# Patient Record
Sex: Female | Born: 1980 | Race: Black or African American | Hispanic: No | Marital: Single | Smoking: Never smoker
Health system: Southern US, Community
[De-identification: ages and names within clinical notes are randomized; demographics above are authoritative.]

## PROBLEM LIST (undated history)

## (undated) DIAGNOSIS — I10 Essential (primary) hypertension: Secondary | ICD-10-CM

## (undated) DIAGNOSIS — E785 Hyperlipidemia, unspecified: Secondary | ICD-10-CM

## (undated) DIAGNOSIS — E119 Type 2 diabetes mellitus without complications: Secondary | ICD-10-CM

## (undated) HISTORY — DX: Hyperlipidemia, unspecified: E78.5

## (undated) HISTORY — DX: Essential (primary) hypertension: I10

---

## 2001-12-25 ENCOUNTER — Emergency Department (HOSPITAL_COMMUNITY): Admission: EM | Admit: 2001-12-25 | Discharge: 2001-12-25 | Payer: Self-pay | Admitting: Emergency Medicine

## 2006-12-04 ENCOUNTER — Emergency Department (HOSPITAL_COMMUNITY): Admission: EM | Admit: 2006-12-04 | Discharge: 2006-12-04 | Payer: Self-pay | Admitting: Emergency Medicine

## 2008-11-09 ENCOUNTER — Emergency Department (HOSPITAL_COMMUNITY): Admission: EM | Admit: 2008-11-09 | Discharge: 2008-11-09 | Payer: Self-pay | Admitting: Emergency Medicine

## 2009-12-20 ENCOUNTER — Inpatient Hospital Stay (HOSPITAL_COMMUNITY): Admission: EM | Admit: 2009-12-20 | Discharge: 2009-12-20 | Payer: Self-pay | Admitting: Emergency Medicine

## 2010-07-14 LAB — GC/CHLAMYDIA PROBE AMP, GENITAL: Chlamydia, DNA Probe: NEGATIVE

## 2010-07-14 LAB — URINE MICROSCOPIC-ADD ON

## 2010-07-14 LAB — COMPREHENSIVE METABOLIC PANEL
CO2: 27 mEq/L (ref 19–32)
Calcium: 9 mg/dL (ref 8.4–10.5)
Glucose, Bld: 108 mg/dL — ABNORMAL HIGH (ref 70–99)
Sodium: 134 mEq/L — ABNORMAL LOW (ref 135–145)
Total Bilirubin: 1.2 mg/dL (ref 0.3–1.2)

## 2010-07-14 LAB — DIFFERENTIAL
Basophils Absolute: 0 10*3/uL (ref 0.0–0.1)
Basophils Absolute: 0 10*3/uL (ref 0.0–0.1)
Basophils Relative: 0 % (ref 0–1)
Eosinophils Absolute: 0.3 10*3/uL (ref 0.0–0.7)
Eosinophils Relative: 1 % (ref 0–5)
Eosinophils Relative: 2 % (ref 0–5)
Monocytes Relative: 5 % (ref 3–12)

## 2010-07-14 LAB — CULTURE, BLOOD (ROUTINE X 2): Culture: NO GROWTH

## 2010-07-14 LAB — GLUCOSE, CAPILLARY

## 2010-07-14 LAB — URINALYSIS, ROUTINE W REFLEX MICROSCOPIC
Glucose, UA: NEGATIVE mg/dL
Ketones, ur: NEGATIVE mg/dL
Protein, ur: NEGATIVE mg/dL
Specific Gravity, Urine: 1.006 (ref 1.005–1.030)
Urobilinogen, UA: 0.2 mg/dL (ref 0.0–1.0)

## 2010-07-14 LAB — POCT I-STAT, CHEM 8
BUN: 6 mg/dL (ref 6–23)
Chloride: 101 mEq/L (ref 96–112)
Creatinine, Ser: 0.9 mg/dL (ref 0.4–1.2)
Glucose, Bld: 126 mg/dL — ABNORMAL HIGH (ref 70–99)
HCT: 40 % (ref 36.0–46.0)
Hemoglobin: 13.6 g/dL (ref 12.0–15.0)
Potassium: 4.1 mEq/L (ref 3.5–5.1)
Sodium: 135 mEq/L (ref 135–145)
TCO2: 26 mmol/L (ref 0–100)

## 2010-07-14 LAB — WET PREP, GENITAL
Clue Cells Wet Prep HPF POC: NONE SEEN
Yeast Wet Prep HPF POC: NONE SEEN

## 2010-07-14 LAB — CBC
Hemoglobin: 12 g/dL (ref 12.0–15.0)
MCH: 29.9 pg (ref 26.0–34.0)
MCHC: 34.1 g/dL (ref 30.0–36.0)
Platelets: 288 10*3/uL (ref 150–400)
Platelets: 294 10*3/uL (ref 150–400)
RBC: 4.11 MIL/uL (ref 3.87–5.11)
WBC: 20.8 10*3/uL — ABNORMAL HIGH (ref 4.0–10.5)
WBC: 22.7 10*3/uL — ABNORMAL HIGH (ref 4.0–10.5)

## 2010-07-14 LAB — URINE CULTURE
Colony Count: NO GROWTH
Culture  Setup Time: 201108232154
Special Requests: NEGATIVE

## 2010-07-14 LAB — D-DIMER, QUANTITATIVE: D-Dimer, Quant: 0.33 ug/mL-FEU (ref 0.00–0.48)

## 2010-07-14 LAB — TSH: TSH: 0.964 u[IU]/mL (ref 0.350–4.500)

## 2010-07-14 LAB — HEMOGLOBIN A1C: Hgb A1c MFr Bld: 8.1 % — ABNORMAL HIGH (ref <5.7)

## 2010-08-06 LAB — POCT I-STAT, CHEM 8
Calcium, Ion: 1.15 mmol/L (ref 1.12–1.32)
Hemoglobin: 14.6 g/dL (ref 12.0–15.0)
Sodium: 138 mEq/L (ref 135–145)
TCO2: 23 mmol/L (ref 0–100)

## 2010-08-06 LAB — DIFFERENTIAL
Eosinophils Absolute: 0.4 10*3/uL (ref 0.0–0.7)
Eosinophils Relative: 2 % (ref 0–5)
Lymphs Abs: 3.4 10*3/uL (ref 0.7–4.0)
Monocytes Relative: 5 % (ref 3–12)

## 2010-08-06 LAB — CBC
Hemoglobin: 13 g/dL (ref 12.0–15.0)
MCHC: 33.1 g/dL (ref 30.0–36.0)
MCV: 89.3 fL (ref 78.0–100.0)
Platelets: 279 10*3/uL (ref 150–400)
RBC: 4.41 MIL/uL (ref 3.87–5.11)
RDW: 14 % (ref 11.5–15.5)

## 2011-05-15 ENCOUNTER — Ambulatory Visit (INDEPENDENT_AMBULATORY_CARE_PROVIDER_SITE_OTHER): Payer: Self-pay

## 2011-05-15 DIAGNOSIS — E109 Type 1 diabetes mellitus without complications: Secondary | ICD-10-CM

## 2011-05-15 DIAGNOSIS — Z111 Encounter for screening for respiratory tuberculosis: Secondary | ICD-10-CM

## 2011-05-15 DIAGNOSIS — Z0000 Encounter for general adult medical examination without abnormal findings: Secondary | ICD-10-CM

## 2011-05-16 ENCOUNTER — Ambulatory Visit (INDEPENDENT_AMBULATORY_CARE_PROVIDER_SITE_OTHER): Payer: Self-pay

## 2011-06-16 ENCOUNTER — Ambulatory Visit: Payer: Self-pay | Admitting: Physician Assistant

## 2011-06-16 ENCOUNTER — Encounter: Payer: Self-pay | Admitting: Physician Assistant

## 2011-06-16 VITALS — BP 114/72 | HR 68 | Temp 98.4°F | Resp 16 | Ht 62.5 in | Wt 217.4 lb

## 2011-06-16 DIAGNOSIS — Z111 Encounter for screening for respiratory tuberculosis: Secondary | ICD-10-CM

## 2011-06-16 NOTE — Progress Notes (Signed)
Patient needs PPD. Here on May 15, 2011 for CPE  With PPD placement. She came in too early for reading. She now comes in for her PPD placement again. She is asymptomatic. See yellow sheet. Ok to place PPD. RTC 48-72 hours for reading.Eula Listen, [name] 06/16/2011 6:42 PM

## 2011-06-19 ENCOUNTER — Encounter (INDEPENDENT_AMBULATORY_CARE_PROVIDER_SITE_OTHER): Payer: Self-pay

## 2011-06-19 DIAGNOSIS — Z111 Encounter for screening for respiratory tuberculosis: Secondary | ICD-10-CM

## 2012-02-25 ENCOUNTER — Encounter (HOSPITAL_COMMUNITY): Payer: Self-pay | Admitting: *Deleted

## 2012-02-25 ENCOUNTER — Emergency Department (HOSPITAL_COMMUNITY)
Admission: EM | Admit: 2012-02-25 | Discharge: 2012-02-25 | Disposition: A | Discharge status: Home / Self Care | Payer: Self-pay | Attending: Emergency Medicine | Admitting: Emergency Medicine

## 2012-02-25 ENCOUNTER — Emergency Department (HOSPITAL_COMMUNITY): Payer: Self-pay

## 2012-02-25 DIAGNOSIS — E860 Dehydration: Secondary | ICD-10-CM

## 2012-02-25 DIAGNOSIS — Z794 Long term (current) use of insulin: Secondary | ICD-10-CM | POA: Insufficient documentation

## 2012-02-25 DIAGNOSIS — E1169 Type 2 diabetes mellitus with other specified complication: Secondary | ICD-10-CM | POA: Insufficient documentation

## 2012-02-25 DIAGNOSIS — D72829 Elevated white blood cell count, unspecified: Secondary | ICD-10-CM

## 2012-02-25 DIAGNOSIS — R739 Hyperglycemia, unspecified: Secondary | ICD-10-CM

## 2012-02-25 HISTORY — DX: Type 2 diabetes mellitus without complications: E11.9

## 2012-02-25 LAB — LACTIC ACID, PLASMA: Lactic Acid, Venous: 1.4 mmol/L (ref 0.5–2.2)

## 2012-02-25 LAB — URINALYSIS, ROUTINE W REFLEX MICROSCOPIC
Glucose, UA: >1000 mg/dL — AB
Ketones, ur: >80 mg/dL — AB
Leukocytes, UA: NEGATIVE
Nitrite: NEGATIVE
Protein, ur: >300 mg/dL — AB
Specific Gravity, Urine: 1.035 — ABNORMAL HIGH (ref 1.005–1.030)
Urobilinogen, UA: 1 mg/dL (ref 0.0–1.0)
pH: 6 (ref 5.0–8.0)

## 2012-02-25 LAB — CBC WITH DIFFERENTIAL/PLATELET
Basophils Absolute: 0.1 10*3/uL (ref 0.0–0.1)
Basophils Relative: 0 % (ref 0–1)
Eosinophils Absolute: 0 10*3/uL (ref 0.0–0.7)
Eosinophils Relative: 0 % (ref 0–5)
HCT: 42.6 % (ref 36.0–46.0)
Hemoglobin: 14.6 g/dL (ref 12.0–15.0)
Lymphocytes Relative: 11 % — ABNORMAL LOW (ref 12–46)
Lymphs Abs: 2.6 10*3/uL (ref 0.7–4.0)
MCH: 29.5 pg (ref 26.0–34.0)
MCHC: 34.3 g/dL (ref 30.0–36.0)
MCV: 86.1 fL (ref 78.0–100.0)
Monocytes Absolute: 1.7 K/uL — ABNORMAL HIGH (ref 0.1–1.0)
Monocytes Relative: 7 % (ref 3–12)
Neutro Abs: 19.9 K/uL — ABNORMAL HIGH (ref 1.7–7.7)
Neutrophils Relative %: 82 % — ABNORMAL HIGH (ref 43–77)
Platelets: 285 10*3/uL (ref 150–400)
RBC: 4.95 MIL/uL (ref 3.87–5.11)
RDW: 13.1 % (ref 11.5–15.5)
WBC: 24.3 10*3/uL — ABNORMAL HIGH (ref 4.0–10.5)

## 2012-02-25 LAB — URINE MICROSCOPIC-ADD ON

## 2012-02-25 LAB — COMPREHENSIVE METABOLIC PANEL WITH GFR
Albumin: 2.9 g/dL — ABNORMAL LOW (ref 3.5–5.2)
BUN: 8 mg/dL (ref 6–23)
Chloride: 95 meq/L — ABNORMAL LOW (ref 96–112)
Creatinine, Ser: 0.9 mg/dL (ref 0.50–1.10)
GFR calc non Af Amer: 84 mL/min — ABNORMAL LOW (ref >90)
Total Bilirubin: 0.4 mg/dL (ref 0.3–1.2)

## 2012-02-25 LAB — COMPREHENSIVE METABOLIC PANEL
ALT: 27 U/L (ref 0–35)
AST: 27 U/L (ref 0–37)
Alkaline Phosphatase: 106 U/L (ref 39–117)
CO2: 21 mEq/L (ref 19–32)
Calcium: 9.2 mg/dL (ref 8.4–10.5)
GFR calc Af Amer: >90 mL/min (ref >90)
Glucose, Bld: 299 mg/dL — ABNORMAL HIGH (ref 70–99)
Potassium: 3.8 mEq/L (ref 3.5–5.1)
Sodium: 129 mEq/L — ABNORMAL LOW (ref 135–145)
Total Protein: 8.2 g/dL (ref 6.0–8.3)

## 2012-02-25 LAB — GLUCOSE, CAPILLARY
Glucose-Capillary: 280 mg/dL — ABNORMAL HIGH (ref 70–99)
Glucose-Capillary: 229 mg/dL — ABNORMAL HIGH (ref 70–99)

## 2012-02-25 MED ORDER — INSULIN REGULAR HUMAN 100 UNIT/ML IJ SOLN: 8.0000 [IU] | Freq: Once | INTRAMUSCULAR | Status: DC

## 2012-02-25 MED ORDER — SODIUM CHLORIDE 0.9 % IV BOLUS (SEPSIS)
1000.0000 mL | Freq: Once | INTRAVENOUS | Status: AC
  Administered 2012-02-25: 1000 mL via INTRAVENOUS

## 2012-02-25 MED ORDER — INSULIN GLARGINE 100 UNIT/ML ~~LOC~~ SOLN
10.0000 [IU] | Freq: Once | SUBCUTANEOUS | Status: AC
  Administered 2012-02-25: 10 [IU] via SUBCUTANEOUS
  Filled 2012-02-25: qty 1

## 2012-02-25 MED ORDER — INSULIN LISPRO PROT & LISPRO (75-25 MIX) 100 UNIT/ML ~~LOC~~ SUSP: 40.0000 [IU] | Freq: Two times a day (BID) | SUBCUTANEOUS | Status: DC

## 2012-02-25 MED ORDER — ACETAMINOPHEN 325 MG PO TABS
ORAL_TABLET | ORAL | Status: AC
  Filled 2012-02-25: qty 1

## 2012-02-25 MED ORDER — ACETAMINOPHEN 325 MG PO TABS
650.0000 mg | ORAL_TABLET | Freq: Once | ORAL | Status: AC
  Administered 2012-02-25: 650 mg via ORAL
  Filled 2012-02-25: qty 1

## 2012-02-25 MED ORDER — INSULIN ASPART 100 UNIT/ML ~~LOC~~ SOLN
8.0000 [IU] | Freq: Once | SUBCUTANEOUS | Status: AC
  Administered 2012-02-25: 8 [IU] via SUBCUTANEOUS
  Filled 2012-02-25: qty 1

## 2012-02-25 NOTE — ED Provider Notes (Addendum)
Recheck pt comfortable. No nv. Tolerating po. Iv fluids complete. Dr Oletta Lamas had indicated d/c pt to home post ivf, that case management coming to assist w getting home meds refilled.   Recheck vitals, afeb, vitals normal. No nv. Blood glucose improved.    Suzi Roots, MD 02/25/12 [phone]   Case manager states they will fill pts insulin for her.  Verified dose w pt, pt states humalog 75/25, 40 units bid.   Suzi Roots, MD 02/25/12 [phone]

## 2012-02-25 NOTE — ED Notes (Signed)
Pt given breakfast tray

## 2012-02-25 NOTE — ED Notes (Signed)
Pt reports elevated blood sugars at home. Also states she has been out of medications for two weeks.

## 2012-02-25 NOTE — Progress Notes (Signed)
[facility] ED CM spoke with ED RN, EDP and meredith in [facility] pharmacy . Pt eligible for chs indigent medication program.  Rx processed and tubed to pharmacy. Pending completion of pharmacy.  Pt with pcp but no coverage as confirmed by pt CM spoke with pt to review list of self pay pcps to further assist her with prescriptions and health care if she is unable to work with her pcp to resolve her bill. Encouraged pt to speak with pcp billing office staff Pt is Dm type 1.   Discussed and provided written information for discounted pharmacies, DSS, health dept, [email or web address], health reform, discount insulin at [employer], chs emergency level of care compared to pcp level of care, insulin pumps, outpatient pharmacies, chs annual indigent medication assistance and financial assistance programs in [employer]  Pt voiced understanding and appreciation of resources and services offered

## 2012-02-25 NOTE — ED Notes (Signed)
Pt reports checked blood sugar early Sunday at it was greater than 500; pt c/o increase urination and feeling weak; denies increase thirst; c/o some slight abdominal cramping; pt has not checked blood sugar since 10/27 early am.

## 2012-02-25 NOTE — ED Notes (Signed)
Patient is resting comfortably. 

## 2012-02-25 NOTE — ED Notes (Signed)
Case management contacted, Selena Batten, message left on voicemail.

## 2012-02-25 NOTE — ED Provider Notes (Signed)
History     CSN: [id number]  Arrival date & time 02/25/12  0306   First MD Initiated Contact with Patient 02/25/12 0350      Chief Complaint  Patient presents with  . Hyperglycemia    (Consider location/radiation/quality/duration/timing/severity/associated sxs/prior treatment) HPI Comments: Pt reports has had DM for about 6 years, told type I DM, has always been on insulin, most recently on 75/25 twice daily.  Has run out about 2 weeks ago and she reprots owe's her PCP office money and so they have not been giving her samples of insulin.  She has had anorexia, so has really been only drinking diet flavored waters.  Some loose stools today times 2, no fevers, chills, sore throat.  Has felt achy.  Has not had flu shot.  No sick contacts.  No N/V.  Due to sugar being over 500 today, came to the ED.  Has otherwise not taken anything for symptoms .   The history is provided by the patient.    Past Medical History  Diagnosis Date  . Diabetes mellitus without complication     History reviewed. No pertinent past surgical history.  No family history on file.  History  Substance Use Topics  . Smoking status: Never Smoker   . Smokeless tobacco: Not on file  . Alcohol Use: No    OB History    Grav Para Term Preterm Abortions TAB SAB Ect Mult Living                  Review of Systems  Constitutional: Positive for appetite change and fatigue.  HENT: Negative for congestion, sore throat, trouble swallowing and sinus pressure.   Respiratory: Negative for cough and shortness of breath.   Cardiovascular: Negative for chest pain.  Gastrointestinal: Negative for nausea, vomiting, abdominal pain and diarrhea.  Genitourinary: Positive for frequency.  Musculoskeletal: Positive for myalgias. Negative for back pain.  Skin: Negative for rash.  Neurological: Positive for weakness. Negative for dizziness, light-headedness, numbness and headaches.  All other systems reviewed and are  negative.    Allergies  Review of patient's allergies indicates no known allergies.  Home Medications   Current Outpatient Rx  Name Route Sig Dispense Refill  . INSULIN LISPRO PROT & LISPRO (75-25) 100 UNIT/ML [place] SUSP Subcutaneous Inject 40 Units into the skin 2 (two) times daily with a meal.      BP 107/83  Pulse 87  Temp 99.3 F (37.4 C) (Oral)  Resp 18  Ht 5\' 2"  (1.575 m)  Wt 187 lb 6.4 oz (85.004 kg)  BMI 34.28 kg/m2  SpO2 97%  LMP 02/17/2012  Physical Exam  Nursing note and vitals reviewed. Constitutional: She is oriented to person, place, and time. She appears well-developed and well-nourished.  Non-toxic appearance. She does not have a sickly appearance. She appears ill. No distress.  HENT:  Head: Normocephalic and atraumatic.  Mouth/Throat: Uvula is midline. Mucous membranes are dry.  Eyes: Pupils are equal, round, and reactive to light. No scleral icterus.  Cardiovascular: Regular rhythm.  Tachycardia present.   Pulmonary/Chest: Effort normal. No respiratory distress. She has no wheezes.  Abdominal: Soft. She exhibits no distension. There is no tenderness. There is no rebound.  Musculoskeletal: Normal range of motion.  Neurological: She is alert and oriented to person, place, and time.  Skin: Skin is warm and dry. No rash noted. She is not diaphoretic.  Psychiatric: She has a normal mood and affect.    ED Course  Procedures (  including critical care time)  Labs Reviewed  CBC WITH DIFFERENTIAL - Abnormal; Notable for the following:    WBC 24.3 (*)     Neutrophils Relative 82 (*)     Neutro Abs 19.9 (*)     Lymphocytes Relative 11 (*)     Monocytes Absolute 1.7 (*)     All other components within normal limits  COMPREHENSIVE METABOLIC PANEL - Abnormal; Notable for the following:    Sodium 129 (*)     Chloride 95 (*)     Glucose, Bld 299 (*)     Albumin 2.9 (*)     GFR calc non Af Amer 84 (*)     All other components within normal limits  URINALYSIS,  ROUTINE W REFLEX MICROSCOPIC - Abnormal; Notable for the following:    APPearance CLOUDY (*)     Specific Gravity, Urine 1.035 (*)     Glucose, UA >1000 (*)     Hgb urine dipstick MODERATE (*)     Bilirubin Urine SMALL (*)     Ketones, ur >80 (*)     Protein, ur >300 (*)     All other components within normal limits  GLUCOSE, CAPILLARY - Abnormal; Notable for the following:    Glucose-Capillary 280 (*)     All other components within normal limits  URINE MICROSCOPIC-ADD ON - Abnormal; Notable for the following:    Squamous Epithelial / LPF MANY (*)     Bacteria, UA FEW (*)     Casts GRANULAR CAST (*)     All other components within normal limits  LACTIC ACID, PLASMA  URINE CULTURE  CULTURE, BLOOD (ROUTINE X 2)  CULTURE, BLOOD (ROUTINE X 2)   Dg Chest Port 1 View  02/25/2012  *RADIOLOGY REPORT*  Clinical Data: Fever.  PORTABLE CHEST - 1 VIEW  Comparison: PA and lateral chest 12/19/2009.  Findings: Lungs are clear.  Heart size is normal.  No pneumothorax or pleural fluid.  IMPRESSION: Negative chest.   Original Report Authenticated By: Bernadene Bell. D'ALESSIO, M.D.      1. Hyperglycemia   2. Leukocytosis   3. Dehydration     ra sat is 100% which is normal by my interpretation  5:54 AM Anion gap is only 13.  Ketones in UA may be simply from not eating much, drinking water only.  Will continue IVF's, offer PO's, give some [place] insulin and discuss with case management to get pt insulin.  Leukocytosis is non specific.  Lactate is normal.     7:23 AM Pt signed out to Dr. Denton Lank to recheck on pt's glucose, case management can be called by RN to address medication needs. recommend follow up with PCP Dr. Talmage Nap.  MDM  Pt reports is type 1 diabetic.  If out of insulin for 2 weeks, I would anticipate glucose would be higher.  Here 280.  Pt with low grade fever, tachycardia, appears dry and dehydrated due to polyuria.  Will give IVF's.  Labs, cultures.  Pt's WBC is very high which prior labs  have shown similar values of low to mid 20's.  Ketones in UA.  No N/V, but could be in early DKA.          Gavin Pound. Oletta Lamas, MD 02/25/12 [phone]

## 2012-02-26 LAB — URINE CULTURE: Colony Count: >=100000

## 2012-03-02 LAB — CULTURE, BLOOD (ROUTINE X 2)
Culture: NO GROWTH
Culture: NO GROWTH

## 2013-09-28 ENCOUNTER — Telehealth: Payer: Self-pay

## 2013-09-28 ENCOUNTER — Ambulatory Visit (INDEPENDENT_AMBULATORY_CARE_PROVIDER_SITE_OTHER): Payer: No Typology Code available for payment source | Admitting: Internal Medicine

## 2013-09-28 VITALS — BP 122/88 | HR 112 | Temp 99.1°F | Resp 18 | Ht 63.0 in | Wt 196.6 lb

## 2013-09-28 DIAGNOSIS — B9689 Other specified bacterial agents as the cause of diseases classified elsewhere: Secondary | ICD-10-CM

## 2013-09-28 DIAGNOSIS — E119 Type 2 diabetes mellitus without complications: Secondary | ICD-10-CM | POA: Insufficient documentation

## 2013-09-28 DIAGNOSIS — J329 Chronic sinusitis, unspecified: Secondary | ICD-10-CM

## 2013-09-28 MED ORDER — AMOXICILLIN 500 MG PO CAPS: 1000.0000 mg | ORAL_CAPSULE | Freq: Two times a day (BID) | ORAL | Status: DC

## 2013-09-28 NOTE — Patient Instructions (Signed)

## 2013-09-28 NOTE — Telephone Encounter (Signed)
Pt notified that it was sent in to [facility] on [address] by [facility].

## 2013-09-28 NOTE — Telephone Encounter (Signed)
Pt seen this morning and wants to know why rx has not been called in yet [facility] [address]

## 2013-09-28 NOTE — Progress Notes (Signed)
   Subjective:    Patient ID: Lisa Werner, female    DOB: 08-Nov-1980, 33 y.o.   MRN: [id number]   HPI 33 year old female complains of sinus pain and headaches. She states her eyes and ears also hurt. Both of her hands are numb and she is having chills. She has been sick for about a week and had gotten worse 3 days ago.She is blowing yellow mucus out of her nose. No cough or fever. She took some allergy OTC 3 days ago but it hasn't helped it got worse. Blood and copious purulent nasal discharge.  Diabetes  Glucose home today 238 Review of Systems     Objective:   Physical Exam  Constitutional: She is oriented to person, place, and time. She appears well-developed and well-nourished. No distress.  HENT:  Head: Normocephalic.  Right Ear: External ear normal.  Left Ear: External ear normal.  Nose: Mucosal edema, rhinorrhea and sinus tenderness present. Epistaxis is observed. Right sinus exhibits maxillary sinus tenderness and frontal sinus tenderness. Left sinus exhibits maxillary sinus tenderness. Left sinus exhibits no frontal sinus tenderness.  Mouth/Throat: Oropharynx is clear and moist.  Eyes: EOM are normal. Pupils are equal, round, and reactive to light.  Neck: Normal range of motion. Neck supple.  Pulmonary/Chest: Effort normal.  Lymphadenopathy:    She has no cervical adenopathy.  Neurological: She is alert and oriented to person, place, and time. She exhibits normal muscle tone. Coordination normal.  Psychiatric: She has a normal mood and affect. Her behavior is normal.          Assessment & Plan:  Sinusitis/IDDM Amoxil/F/up Dr. Talmage Nap

## 2013-11-07 ENCOUNTER — Emergency Department (HOSPITAL_COMMUNITY)
Admission: EM | Admit: 2013-11-07 | Discharge: 2013-11-07 | Disposition: A | Discharge status: Home / Self Care | Payer: No Typology Code available for payment source | Attending: Emergency Medicine | Admitting: Emergency Medicine

## 2013-11-07 ENCOUNTER — Encounter (HOSPITAL_COMMUNITY): Payer: Self-pay | Admitting: Emergency Medicine

## 2013-11-07 DIAGNOSIS — Z79899 Other long term (current) drug therapy: Secondary | ICD-10-CM | POA: Insufficient documentation

## 2013-11-07 DIAGNOSIS — E109 Type 1 diabetes mellitus without complications: Secondary | ICD-10-CM | POA: Insufficient documentation

## 2013-11-07 DIAGNOSIS — R600 Localized edema: Secondary | ICD-10-CM

## 2013-11-07 DIAGNOSIS — R609 Edema, unspecified: Secondary | ICD-10-CM | POA: Insufficient documentation

## 2013-11-07 DIAGNOSIS — Z794 Long term (current) use of insulin: Secondary | ICD-10-CM | POA: Insufficient documentation

## 2013-11-07 MED ORDER — FUROSEMIDE 20 MG PO TABS
20.0000 mg | ORAL_TABLET | Freq: Every day | ORAL | Status: DC
Start: 2013-11-07 — End: 2017-07-17

## 2013-11-07 NOTE — ED Notes (Signed)
AVS explained in detail, especially regarding potassium replacements and proper use of Lasix. Advised to follow up with PCP for better management and potassium re-check. No questions/concerns. Ambulatory with steady gait.

## 2013-11-07 NOTE — ED Notes (Signed)
She c/o painless bilat. Lower leg swelling x ~ 2 weeks.  She states she has had a few episodes of this in the past "but it went away when I put my feet up--this time it's not going away".  She denies pain and is in no distress.

## 2013-11-07 NOTE — ED Provider Notes (Signed)
CSN: [id number]     Arrival date & time 11/07/13  1749 History   None    This chart was scribed for non-physician practitioner, Junius Finner [name], working with Juliet Rude. Rubin Payor, MD by Arlan Organ, ED Scribe. This patient was seen in room WTR5/WTR5 and the patient's care was started at 7:48 PM.   Chief Complaint  Patient presents with  . Leg Swelling   The history is provided by the patient. No language interpreter was used.    HPI Comments: Lisa Werner is a 33 y.o. female with a PMHx of DM Type 1 who presents to the Emergency Department complaining of constant, moderate bilateral lower extremity swelling x 2 weeks that is unchanged. Pt also mentions new mild back pain onset 2-3 days. She denies any recent diet change or increased sodium intake. However, she admits to increased soda consumption. She has tried elevating her legs without any noticeable improvement. States typically when this occasionally occurs, symptoms resolve with elevation. She denies any fever, chills, nausea, vomiting, SOB, or chest pain. She is not currently followed by a PCP. Pt is currently not taking any fluid pills. She has no pertinent past medical history. No other concerns this visit.   Past Medical History  Diagnosis Date  . Diabetes mellitus without complication    No past surgical history on file. No family history on file. History  Substance Use Topics  . Smoking status: Never Smoker   . Smokeless tobacco: Not on file  . Alcohol Use: No   OB History   Grav Para Term Preterm Abortions TAB SAB Ect Mult Living                 Review of Systems  Constitutional: Negative for fever, chills and appetite change.  Respiratory: Negative for chest tightness and shortness of breath.   Cardiovascular: Positive for leg swelling (Bilateral\). Negative for chest pain.  Gastrointestinal: Negative for nausea and vomiting.  Skin: Negative for rash.      Allergies  Review of patient's allergies  indicates no known allergies.  Home Medications   Prior to Admission medications   Medication Sig Start Date End Date Taking? Authorizing Provider  insulin lispro protamine-insulin lispro (HUMALOG 75/25) (75-25) 100 UNIT/ML SUSP Inject 40 Units into the skin 2 (two) times daily with a meal. 02/25/12  Yes Suzi Roots, MD  furosemide (LASIX) 20 MG tablet Take 1 tablet (20 mg total) by mouth daily. 11/07/13   Junius Finner, [name]   Triage Vitals: BP 165/101  Pulse 98  Temp(Src) 98.3 F (36.8 C) (Oral)  Resp 18  SpO2 96%  LMP 10/26/2013   Physical Exam  Nursing note and vitals reviewed. Constitutional: She is oriented to person, place, and time. She appears well-developed and well-nourished.  HENT:  Head: Normocephalic and atraumatic.  Eyes: EOM are normal.  Neck: Normal range of motion.  Cardiovascular: Normal rate, regular rhythm and normal heart sounds.   Pulmonary/Chest: Effort normal and breath sounds normal.  Musculoskeletal: Normal range of motion. She exhibits edema.  1 plus pitting moderate edema to bilateral lower extremities worse in ankles No calf tenderness FROM of ankles and all toes  Neurological: She is alert and oriented to person, place, and time.  Skin: Skin is warm and dry.  Skin intact No erythema or warmth  Psychiatric: She has a normal mood and affect. Her behavior is normal.    ED Course  Procedures (including critical care time)  DIAGNOSTIC STUDIES: Oxygen Saturation is  96% on RA, Adequate by my interpretation.    COORDINATION OF CARE: 7:49 PM- Will prescribe Lasix at discharge. Advised pt of possible increased urine output secondary to medication. Discussed treatment plan with pt at bedside and pt agreed to plan.     Labs Review Labs Reviewed - No data to display  Imaging Review No results found.   EKG Interpretation None      MDM   Final diagnoses:  Bilateral leg edema    pt is a 33yo female with hx of IDDM presenting to ED c/o  bilateral lower leg swelling x2 weeks. Reports swelling typically decreases with elevation but this time it is not going away. Pt denies injury. Denies fever, n/v/d. Denies change in skin color. Denies pain or numbness in legs or feet.  Lungs: CTAB. No respiratory distress. No evidence of underlying infection. Doubt DVT.  Will place pt on low dose of Lasix and have pt f/u with PCP for further evaluation and continued management of lower leg edema. Return precautions provided. Pt verbalized understanding and agreement with tx plan.   I personally performed the services described in this documentation, which was scribed in my presence. The recorded information has been reviewed and is accurate.    Junius Finnerrin O'Malley, [name] 11/08/13 1010

## 2013-11-07 NOTE — Discharge Instructions (Signed)
Please call to schedule a follow up appointment with your primary care provider for recheck of swelling in your legs.  It is important to have close follow up for continuous evaluation and treatment of leg swelling, especially with diabetes.  If swelling does not improve with the help of lasix and elevation, you may need compression stockings and you may also need additional testing/imaging.    The lasix may cause you to pee more, this is a common side effect of the medication.  Return to the ER if you develop chest pain or shortness of breathing.    Peripheral Edema You have swelling in your legs (peripheral edema). This swelling is due to excess accumulation of salt and water in your body. Edema may be a sign of heart, kidney or liver disease, or a side effect of a medication. It may also be due to problems in the leg veins. Elevating your legs and using special support stockings may be very helpful, if the cause of the swelling is due to poor venous circulation. Avoid long periods of standing, whatever the cause. Treatment of edema depends on identifying the cause. Chips, pretzels, pickles and other salty foods should be avoided. Restricting salt in your diet is almost always needed. Water pills (diuretics) are often used to remove the excess salt and water from your body via urine. These medicines prevent the kidney from reabsorbing sodium. This increases urine flow. Diuretic treatment may also result in lowering of potassium levels in your body. Potassium supplements may be needed if you have to use diuretics daily. Daily weights can help you keep track of your progress in clearing your edema. You should call your caregiver for follow up care as recommended. SEEK IMMEDIATE MEDICAL CARE IF:   You have increased swelling, pain, redness, or heat in your legs.  You develop shortness of breath, especially when lying down.  You develop chest or abdominal pain, weakness, or fainting.  You have a  fever. Document Released: 05/24/2004 Document Revised: 07/09/2011 Document Reviewed: 05/04/2009 [facility]Exit[facility] Patient Information 2015 [place], [place]. This information is not intended to replace advice given to you by your health care provider. Make sure you discuss any questions you have with your health care provider.

## 2013-11-09 NOTE — ED Provider Notes (Signed)
Medical screening examination/treatment/procedure(s) were performed by non-physician practitioner and as supervising physician I was immediately available for consultation/collaboration.   EKG Interpretation None       Juliet RudeNathan R. Rubin PayorPickering, MD 11/09/13 0020

## 2014-04-01 ENCOUNTER — Encounter (HOSPITAL_COMMUNITY): Payer: Self-pay | Admitting: Emergency Medicine

## 2014-04-01 ENCOUNTER — Emergency Department (HOSPITAL_COMMUNITY)
Admission: EM | Admit: 2014-04-01 | Discharge: 2014-04-01 | Disposition: A | Discharge status: Home / Self Care | Payer: No Typology Code available for payment source | Attending: Emergency Medicine | Admitting: Emergency Medicine

## 2014-04-01 DIAGNOSIS — Z7951 Long term (current) use of inhaled steroids: Secondary | ICD-10-CM | POA: Insufficient documentation

## 2014-04-01 DIAGNOSIS — R739 Hyperglycemia, unspecified: Secondary | ICD-10-CM

## 2014-04-01 DIAGNOSIS — Z794 Long term (current) use of insulin: Secondary | ICD-10-CM | POA: Insufficient documentation

## 2014-04-01 DIAGNOSIS — E1165 Type 2 diabetes mellitus with hyperglycemia: Secondary | ICD-10-CM | POA: Insufficient documentation

## 2014-04-01 DIAGNOSIS — Z3202 Encounter for pregnancy test, result negative: Secondary | ICD-10-CM | POA: Insufficient documentation

## 2014-04-01 LAB — CBC WITH DIFFERENTIAL/PLATELET
BASOS PCT: 0 % (ref 0–1)
Basophils Absolute: 0.1 10*3/uL (ref 0.0–0.1)
Eosinophils Absolute: 0.3 10*3/uL (ref 0.0–0.7)
Eosinophils Relative: 2 % (ref 0–5)
HEMATOCRIT: 41 % (ref 36.0–46.0)
Hemoglobin: 13.5 g/dL (ref 12.0–15.0)
Lymphocytes Relative: 17 % (ref 12–46)
Lymphs Abs: 2.1 10*3/uL (ref 0.7–4.0)
MCH: 28.9 pg (ref 26.0–34.0)
MCHC: 32.9 g/dL (ref 30.0–36.0)
MCV: 87.8 fL (ref 78.0–100.0)
MONO ABS: 0.7 10*3/uL (ref 0.1–1.0)
Monocytes Relative: 5 % (ref 3–12)
NEUTROS ABS: 9.3 10*3/uL — AB (ref 1.7–7.7)
Neutrophils Relative %: 76 % (ref 43–77)
PLATELETS: 314 10*3/uL (ref 150–400)
RBC: 4.67 MIL/uL (ref 3.87–5.11)
RDW: 12.9 % (ref 11.5–15.5)
WBC: 12.4 10*3/uL — ABNORMAL HIGH (ref 4.0–10.5)

## 2014-04-01 LAB — URINALYSIS, ROUTINE W REFLEX MICROSCOPIC
BILIRUBIN URINE: NEGATIVE
Glucose, UA: >1000 mg/dL — AB
Ketones, ur: 15 mg/dL — AB
Leukocytes, UA: NEGATIVE
Nitrite: NEGATIVE
Protein, ur: 100 mg/dL — AB
Specific Gravity, Urine: 1.037 — ABNORMAL HIGH (ref 1.005–1.030)
Urobilinogen, UA: 0.2 mg/dL (ref 0.0–1.0)
pH: 5.5 (ref 5.0–8.0)

## 2014-04-01 LAB — BLOOD GAS, VENOUS
ACID-BASE DEFICIT: 0.5 mmol/L (ref 0.0–2.0)
BICARBONATE: 25.8 meq/L — AB (ref 20.0–24.0)
Drawn by: 294591
FIO2: 0.21 %
O2 Saturation: 21.2 %
PATIENT TEMPERATURE: 97.9
TCO2: 23.5 mmol/L (ref 0–100)
pCO2, Ven: 50.1 mmHg — ABNORMAL HIGH (ref 45.0–50.0)
pH, Ven: 7.33 — ABNORMAL HIGH (ref 7.250–7.300)

## 2014-04-01 LAB — BASIC METABOLIC PANEL
ANION GAP: 16 — AB (ref 5–15)
BUN: 7 mg/dL (ref 6–23)
CALCIUM: 9.6 mg/dL (ref 8.4–10.5)
CO2: 23 mEq/L (ref 19–32)
CREATININE: 0.71 mg/dL (ref 0.50–1.10)
Chloride: 94 mEq/L — ABNORMAL LOW (ref 96–112)
GFR calc Af Amer: >90 mL/min (ref >90)
Glucose, Bld: 353 mg/dL — ABNORMAL HIGH (ref 70–99)
Potassium: 4.3 mEq/L (ref 3.7–5.3)
Sodium: 133 mEq/L — ABNORMAL LOW (ref 137–147)

## 2014-04-01 LAB — POC URINE PREG, ED: Preg Test, Ur: NEGATIVE

## 2014-04-01 LAB — CBG MONITORING, ED
Glucose-Capillary: 346 mg/dL — ABNORMAL HIGH (ref 70–99)
Glucose-Capillary: 273 mg/dL — ABNORMAL HIGH (ref 70–99)

## 2014-04-01 LAB — URINE MICROSCOPIC-ADD ON

## 2014-04-01 MED ORDER — FLUCONAZOLE 150 MG PO TABS
150.0000 mg | ORAL_TABLET | Freq: Once | ORAL | Status: AC
  Administered 2014-04-01: 150 mg via ORAL
  Filled 2014-04-01: qty 1

## 2014-04-01 MED ORDER — SODIUM CHLORIDE 0.9 % IV BOLUS (SEPSIS)
2000.0000 mL | Freq: Once | INTRAVENOUS | Status: AC
  Administered 2014-04-01: 2000 mL via INTRAVENOUS

## 2014-04-01 MED ORDER — INSULIN ASPART 100 UNIT/ML ~~LOC~~ SOLN
6.0000 [IU] | Freq: Once | SUBCUTANEOUS | Status: AC
  Administered 2014-04-01: 6 [IU] via SUBCUTANEOUS
  Filled 2014-04-01: qty 1

## 2014-04-01 MED ORDER — INSULIN ASPART PROT & ASPART (70-30 MIX) 100 UNIT/ML ~~LOC~~ SUSP
40.0000 [IU] | Freq: Two times a day (BID) | SUBCUTANEOUS | Status: DC
  Administered 2014-04-01: 40 [IU] via SUBCUTANEOUS
  Filled 2014-04-01: qty 10

## 2014-04-01 NOTE — Discharge Instructions (Signed)
Hyperglycemia Use your insulin as prescribed. Follow-up with Dr. Yetta BarreJones today to get supplies such as glucose testing strips. Hyperglycemia occurs when the glucose (sugar) in your blood is too high. Hyperglycemia can happen for many reasons, but it most often happens to people who do not know they have diabetes or are not managing their diabetes properly.  CAUSES  Whether you have diabetes or not, there are other causes of hyperglycemia. Hyperglycemia can occur when you have diabetes, but it can also occur in other situations that you might not be as aware of, such as: Diabetes  If you have diabetes and are having problems controlling your blood glucose, hyperglycemia could occur because of some of the following reasons:  Not following your meal plan.  Not taking your diabetes medications or not taking it properly.  Exercising less or doing less activity than you normally do.  Being sick. Pre-diabetes  This cannot be ignored. Before people develop Type 2 diabetes, they almost always have "pre-diabetes." This is when your blood glucose levels are higher than normal, but not yet high enough to be diagnosed as diabetes. Research has shown that some long-term damage to the body, especially the heart and circulatory system, may already be occurring during pre-diabetes. If you take action to manage your blood glucose when you have pre-diabetes, you may delay or prevent Type 2 diabetes from developing. Stress  If you have diabetes, you may be "diet" controlled or on oral medications or insulin to control your diabetes. However, you may find that your blood glucose is higher than usual in the hospital whether you have diabetes or not. This is often referred to as "stress hyperglycemia." Stress can elevate your blood glucose. This happens because of hormones put out by the body during times of stress. If stress has been the cause of your high blood glucose, it can be followed regularly by your caregiver.  That way he/she can make sure your hyperglycemia does not continue to get worse or progress to diabetes. Steroids  Steroids are medications that act on the infection fighting system (immune system) to block inflammation or infection. One side effect can be a rise in blood glucose. Most people can produce enough extra insulin to allow for this rise, but for those who cannot, steroids make blood glucose levels go even higher. It is not unusual for steroid treatments to "uncover" diabetes that is developing. It is not always possible to determine if the hyperglycemia will go away after the steroids are stopped. A special blood test called an A1c is sometimes done to determine if your blood glucose was elevated before the steroids were started. SYMPTOMS  Thirsty.  Frequent urination.  Dry mouth.  Blurred vision.  Tired or fatigue.  Weakness.  Sleepy.  Tingling in feet or leg. DIAGNOSIS  Diagnosis is made by monitoring blood glucose in one or all of the following ways:  A1c test. This is a chemical found in your blood.  Fingerstick blood glucose monitoring.  Laboratory results. TREATMENT  First, knowing the cause of the hyperglycemia is important before the hyperglycemia can be treated. Treatment may include, but is not be limited to:  Education.  Change or adjustment in medications.  Change or adjustment in meal plan.  Treatment for an illness, infection, etc.  More frequent blood glucose monitoring.  Change in exercise plan.  Decreasing or stopping steroids.  Lifestyle changes. HOME CARE INSTRUCTIONS   Test your blood glucose as directed.  Exercise regularly. Your caregiver will give you  instructions about exercise. Pre-diabetes or diabetes which comes on with stress is helped by exercising.  Eat wholesome, balanced meals. Eat often and at regular, fixed times. Your caregiver or nutritionist will give you a meal plan to guide your sugar intake.  Being at an ideal  weight is important. If needed, losing as little as 10 to 15 pounds may help improve blood glucose levels. SEEK MEDICAL CARE IF:   You have questions about medicine, activity, or diet.  You continue to have symptoms (problems such as increased thirst, urination, or weight gain). SEEK IMMEDIATE MEDICAL CARE IF:   You are vomiting or have diarrhea.  Your breath smells fruity.  You are breathing faster or slower.  You are very sleepy or incoherent.  You have numbness, tingling, or pain in your feet or hands.  You have chest pain.  Your symptoms get worse even though you have been following your caregiver's orders.  If you have any other questions or concerns. Document Released: 10/10/2000 Document Revised: 07/09/2011 Document Reviewed: 08/13/2011 [facility]Exit[facility] Patient Information 2015 [place], [place]. This information is not intended to replace advice given to you by your health care provider. Make sure you discuss any questions you have with your health care provider.

## 2014-04-01 NOTE — ED Notes (Signed)
Pt presents with c/o waking up at 0350 "not feeling right" chills, vision "not right" difficulty swallowing. Denies pain, denies fever, denies n/v/d.

## 2014-04-01 NOTE — ED Provider Notes (Signed)
CSN: [id number]     Arrival date & time 04/01/14  0402 History   First MD Initiated Contact with Patient 04/01/14 0458     Chief Complaint  Patient presents with  . Doesnt feel right      (Consider location/radiation/quality/duration/timing/severity/associated sxs/prior Treatment) HPI Complains of "not feeling right" onset upon awakening 3 AM today reports difficulty swallowing, blurred vision, throat feels dry. Patient feels thirsty also admits to polyuria. She denies pain anywhere. She admits to noncompliance with her insulin for the past several days as she has run out. No other associated symptoms. She feels somewhat improved since she's been here. No treatment prior to coming here. Vision is now normal. Past Medical History  Diagnosis Date  . Diabetes mellitus without complication    History reviewed. No pertinent past surgical history. No family history on file. History  Substance Use Topics  . Smoking status: Never Smoker   . Smokeless tobacco: Not on file  . Alcohol Use: No   OB History    No data available     Review of Systems  Constitutional: Negative.   HENT: Negative.   Eyes: Positive for visual disturbance.  Respiratory: Negative.   Cardiovascular: Negative.   Gastrointestinal: Negative.   Endocrine: Positive for cold intolerance, polydipsia and polyuria.       Chills  Musculoskeletal: Negative.   Skin: Negative.   Neurological: Negative.   Psychiatric/Behavioral: Negative.   All other systems reviewed and are negative.     Allergies  Review of patient's allergies indicates no known allergies.  Home Medications   Prior to Admission medications   Medication Sig Start Date End Date Taking? Authorizing Provider  fluticasone (FLONASE) 50 MCG/ACT nasal spray Place 2 sprays into both nostrils daily.   Yes Historical Provider, MD  insulin lispro protamine-insulin lispro (HUMALOG 75/25) (75-25) 100 UNIT/ML SUSP Inject 40 Units into the skin 2 (two) times  daily with a meal. 02/25/12  Yes Suzi RootsKevin E Steinl, MD  furosemide (LASIX) 20 MG tablet Take 1 tablet (20 mg total) by mouth daily. Patient not taking: Reported on 04/01/2014 11/07/13   Junius FinnerErin O'Malley, [name]   BP 143/101 mmHg  Pulse 97  Temp(Src) 97.6 F (36.4 C) (Oral)  Resp 14  Ht 5\' 2"  (1.575 m)  Wt 197 lb (89.359 kg)  BMI 36.02 kg/m2  SpO2 98%  LMP 02/28/2014 Physical Exam  Constitutional: She appears well-developed and well-nourished. No distress.  HENT:  Head: Normocephalic and atraumatic.  Mucous membranes dry  Eyes: Conjunctivae are normal. Pupils are equal, round, and reactive to light.  Neck: Neck supple. No tracheal deviation present. No thyromegaly present.  Cardiovascular: Normal rate and regular rhythm.   No murmur heard. Pulmonary/Chest: Effort normal and breath sounds normal.  Abdominal: Soft. Bowel sounds are normal. She exhibits no distension. There is no tenderness.  Musculoskeletal: Normal range of motion. She exhibits no edema or tenderness.  Neurological: She is alert. Coordination normal.  Skin: Skin is warm and dry. No rash noted.  Psychiatric: She has a normal mood and affect.  Nursing note and vitals reviewed.  Visual acuity 20/30 left eye, 20/30 right eye ED Course  Procedures (including critical care time) Labs Review Labs Reviewed  CBG MONITORING, ED - Abnormal; Notable for the following:    Glucose-Capillary 346 (*)    All other components within normal limits    Imaging Review No results found.   EKG Interpretation None     8:25 AM patient alert asymptomatic after treatment with  intravenous fluids and subcutaneous insulin Results for orders placed or performed during the hospital encounter of 04/01/14  Basic metabolic panel  Result Value Ref Range   Sodium 133 (L) 137 - 147 mEq/L   Potassium 4.3 3.7 - 5.3 mEq/L   Chloride 94 (L) 96 - 112 mEq/L   CO2 23 19 - 32 mEq/L   Glucose, Bld 353 (H) 70 - 99 mg/dL   BUN 7 6 - 23 mg/dL   Creatinine,  Ser 1.61 0.50 - 1.10 mg/dL   Calcium 9.6 8.4 - 09.6 mg/dL   GFR calc non Af Amer >90 >90 mL/min   GFR calc Af Amer >90 >90 mL/min   Anion gap 16 (H) 5 - 15  CBC with Differential  Result Value Ref Range   WBC 12.4 (H) 4.0 - 10.5 K/uL   RBC 4.67 3.87 - 5.11 MIL/uL   Hemoglobin 13.5 12.0 - 15.0 g/dL   HCT 04.5 40.9 - 81.1 %   MCV 87.8 78.0 - 100.0 fL   MCH 28.9 26.0 - 34.0 pg   MCHC 32.9 30.0 - 36.0 g/dL   RDW 91.4 78.2 - 95.6 %   Platelets 314 150 - 400 K/uL   Neutrophils Relative % 76 43 - 77 %   Neutro Abs 9.3 (H) 1.7 - 7.7 K/uL   Lymphocytes Relative 17 12 - 46 %   Lymphs Abs 2.1 0.7 - 4.0 K/uL   Monocytes Relative 5 3 - 12 %   Monocytes Absolute 0.7 0.1 - 1.0 K/uL   Eosinophils Relative 2 0 - 5 %   Eosinophils Absolute 0.3 0.0 - 0.7 K/uL   Basophils Relative 0 0 - 1 %   Basophils Absolute 0.1 0.0 - 0.1 K/uL  Urinalysis, Routine w reflex microscopic  Result Value Ref Range   Color, Urine YELLOW YELLOW   APPearance CLEAR CLEAR   Specific Gravity, Urine 1.037 (H) 1.005 - 1.030   pH 5.5 5.0 - 8.0   Glucose, UA >1000 (A) NEGATIVE mg/dL   Hgb urine dipstick TRACE (A) NEGATIVE   Bilirubin Urine NEGATIVE NEGATIVE   Ketones, ur 15 (A) NEGATIVE mg/dL   Protein, ur 213 (A) NEGATIVE mg/dL   Urobilinogen, UA 0.2 0.0 - 1.0 mg/dL   Nitrite NEGATIVE NEGATIVE   Leukocytes, UA NEGATIVE NEGATIVE  Blood gas, venous  Result Value Ref Range   FIO2 0.21 %   pH, Ven 7.330 (H) 7.250 - 7.300   pCO2, Ven 50.1 (H) 45.0 - 50.0 mmHg   pO2, Ven BELOW REPORTABLE RANGE.  30.0 - 45.0 mmHg   Bicarbonate 25.8 (H) 20.0 - 24.0 mEq/L   TCO2 23.5 0 - 100 mmol/L   Acid-base deficit 0.5 0.0 - 2.0 mmol/L   O2 Saturation 21.2 %   Patient temperature 97.9    Collection site VEIN    Drawn by [phone]    Sample type VEIN   Urine microscopic-add on  Result Value Ref Range   Squamous Epithelial / LPF RARE RARE   WBC, UA 3-6 <3 WBC/hpf   Urine-Other RARE YEAST   POC CBG, ED  Result Value Ref Range    Glucose-Capillary 346 (H) 70 - 99 mg/dL  POC urine preg, ED (not at [facility])  Result Value Ref Range   Preg Test, Ur NEGATIVE NEGATIVE  CBG monitoring, ED  Result Value Ref Range   Glucose-Capillary 273 (H) 70 - 99 mg/dL   No results found.  MDM  Plan she'll be given a bottle of insulin NovoLog  75/25 to go to use 40 units twice daily subcutaneously. Diflucan 150 mg po prior to d/c, f/u PMD Final diagnoses:  None   diagnosis #1 hyperglycemia #2 uti #813medication non compliance     Doug SouSam Skyah Hannon, MD 04/01/14 [id number]

## 2014-08-26 ENCOUNTER — Encounter (HOSPITAL_COMMUNITY): Payer: Self-pay | Admitting: Emergency Medicine

## 2014-08-26 ENCOUNTER — Emergency Department (HOSPITAL_COMMUNITY)
Admission: EM | Admit: 2014-08-26 | Discharge: 2014-08-26 | Disposition: A | Discharge status: Home / Self Care | Payer: No Typology Code available for payment source | Attending: Emergency Medicine | Admitting: Emergency Medicine

## 2014-08-26 DIAGNOSIS — Z794 Long term (current) use of insulin: Secondary | ICD-10-CM | POA: Insufficient documentation

## 2014-08-26 DIAGNOSIS — K047 Periapical abscess without sinus: Secondary | ICD-10-CM | POA: Insufficient documentation

## 2014-08-26 DIAGNOSIS — K088 Other specified disorders of teeth and supporting structures: Secondary | ICD-10-CM | POA: Diagnosis present

## 2014-08-26 DIAGNOSIS — Z79899 Other long term (current) drug therapy: Secondary | ICD-10-CM | POA: Diagnosis not present

## 2014-08-26 DIAGNOSIS — E119 Type 2 diabetes mellitus without complications: Secondary | ICD-10-CM | POA: Diagnosis not present

## 2014-08-26 DIAGNOSIS — Z7951 Long term (current) use of inhaled steroids: Secondary | ICD-10-CM | POA: Diagnosis not present

## 2014-08-26 MED ORDER — PENICILLIN V POTASSIUM 500 MG PO TABS: 500.0000 mg | ORAL_TABLET | Freq: Three times a day (TID) | ORAL | Status: DC

## 2014-08-26 MED ORDER — PENICILLIN V POTASSIUM 500 MG PO TABS
500.0000 mg | ORAL_TABLET | Freq: Once | ORAL | Status: AC
  Administered 2014-08-26: 500 mg via ORAL
  Filled 2014-08-26: qty 1

## 2014-08-26 MED ORDER — OXYCODONE-ACETAMINOPHEN 5-325 MG PO TABS: 1.0000 | ORAL_TABLET | ORAL | Status: DC | PRN

## 2014-08-26 MED ORDER — IBUPROFEN 800 MG PO TABS
800.0000 mg | ORAL_TABLET | Freq: Once | ORAL | Status: AC
  Administered 2014-08-26: 800 mg via ORAL
  Filled 2014-08-26: qty 1

## 2014-08-26 NOTE — ED Provider Notes (Signed)
CSN: [id number]     Arrival date & time 08/26/14  0132 History   First MD Initiated Contact with Patient 08/26/14 0214     Chief Complaint  Patient presents with  . Dental Pain     (Consider location/radiation/quality/duration/timing/severity/associated sxs/prior Treatment) Patient is a 34 y.o. female presenting with tooth pain. The history is provided by the patient. No language interpreter was used.  Dental Pain Location:  Lower Lower teeth location:  19/LL 1st molar and 18/LL 2nd molar Severity:  Moderate Onset quality:  Gradual Associated symptoms: facial swelling   Associated symptoms: no fever and no neck pain   Associated symptoms comment:  Dental pain on left side associated with facial swelling. No fever. No difficulty swallowing.    Past Medical History  Diagnosis Date  . Diabetes mellitus without complication    History reviewed. No pertinent past surgical history. History reviewed. No pertinent family history. History  Substance Use Topics  . Smoking status: Never Smoker   . Smokeless tobacco: Not on file  . Alcohol Use: No   OB History    No data available     Review of Systems  Constitutional: Negative for fever.  HENT: Positive for dental problem and facial swelling. Negative for trouble swallowing.   Gastrointestinal: Negative for nausea.  Musculoskeletal: Negative for neck pain.      Allergies  Review of patient's allergies indicates no known allergies.  Home Medications   Prior to Admission medications   Medication Sig Start Date End Date Taking? Authorizing Provider  fluticasone (FLONASE) 50 MCG/ACT nasal spray Place 2 sprays into both nostrils daily.    Historical Provider, MD  furosemide (LASIX) 20 MG tablet Take 1 tablet (20 mg total) by mouth daily. Patient not taking: Reported on 04/01/2014 11/07/13   Junius FinnerErin O'Malley, [name]  insulin lispro protamine-insulin lispro (HUMALOG 75/25) (75-25) 100 UNIT/ML SUSP Inject 40 Units into the skin 2 (two)  times daily with a meal. 02/25/12   Cathren LaineKevin Steinl, MD   BP 150/97 mmHg  Pulse 110  Temp(Src) 98.2 F (36.8 C) (Oral)  Resp 20  SpO2 96%  LMP 08/06/2014 (Exact Date) Physical Exam  Constitutional: She is oriented to person, place, and time. She appears well-developed and well-nourished.  HENT:  Large abscess at #20. Mild facial swelling on left. There is no neck fullness or tenderness. She exhibits trismus.  Neck: Normal range of motion.  Pulmonary/Chest: Effort normal.  Neurological: She is alert and oriented to person, place, and time.  Skin: Skin is warm and dry.    ED Course  Procedures (including critical care time) Labs Review Labs Reviewed - No data to display  Imaging Review No results found.   EKG Interpretation None      MDM   Final diagnoses:  None    1. Dental abscess  She is well appearing, non-toxic. Appears uncomfortable which is likely the reason for tachycardia. Started on penicillin in the emergency department. She is provided Rx's for antibiotic and pain and a list of dental resources. Return precautions discussed.     Elpidio AnisShari Amoy Steeves, [name] 08/26/14 [id number]  Loren Raceravid Yelverton, MD 08/26/14 [phone]

## 2014-08-26 NOTE — Discharge Instructions (Signed)
Dental Abscess °A dental abscess is a collection of infected fluid (pus) from a bacterial infection in the inner part of the tooth (pulp). It usually occurs at the end of the tooth's root.  °CAUSES  °· Severe tooth decay. °· Trauma to the tooth that allows bacteria to enter into the pulp, such as a broken or chipped tooth. °SYMPTOMS  °· Severe pain in and around the infected tooth. °· Swelling and redness around the abscessed tooth or in the mouth or face. °· Tenderness. °· Pus drainage. °· Bad breath. °· Bitter taste in the mouth. °· Difficulty swallowing. °· Difficulty opening the mouth. °· Nausea. °· Vomiting. °· Chills. °· Swollen neck glands. °DIAGNOSIS  °· A medical and dental history will be taken. °· An examination will be performed by tapping on the abscessed tooth. °· X-rays may be taken of the tooth to identify the abscess. °TREATMENT °The goal of treatment is to eliminate the infection. You may be prescribed antibiotic medicine to stop the infection from spreading. A root canal may be performed to save the tooth. If the tooth cannot be saved, it may be pulled (extracted) and the abscess may be drained.  °HOME CARE INSTRUCTIONS °· Only take over-the-counter or prescription medicines for pain, fever, or discomfort as directed by your caregiver. °· Rinse your mouth (gargle) often with salt water (¼ tsp salt in 8 oz [250 ml] of warm water) to relieve pain or swelling. °· Do not drive after taking pain medicine (narcotics). °· Do not apply heat to the outside of your face. °· Return to your dentist for further treatment as directed. °SEEK MEDICAL CARE IF: °· Your pain is not helped by medicine. °· Your pain is getting worse instead of better. °SEEK IMMEDIATE MEDICAL CARE IF: °· You have a fever or persistent symptoms for more than 2-3 days. °· You have a fever and your symptoms suddenly get worse. °· You have chills or a very bad headache. °· You have problems breathing or swallowing. °· You have trouble  opening your mouth. °· You have swelling in the neck or around the eye. °Document Released: 04/16/2005 Document Revised: 01/09/2012 Document Reviewed: 07/25/2010 °ExitCare® Patient Information ©2015 [employer]. This information is not intended to replace advice given to you by your health care provider. Make sure you discuss any questions you have with your health care provider. ° °Emergency Department Resource Guide °1) Find a Doctor and Pay Out of Pocket °Although you won't have to find out who is covered by your insurance plan, it is a good idea to ask around and get recommendations. You will then need to call the office and see if the doctor you have chosen will accept you as a new patient and what types of options they offer for patients who are self-pay. Some doctors offer discounts or will set up payment plans for their patients who do not have insurance, but you will need to ask so you aren't surprised when you get to your appointment. ° °2) Contact Your Local Health Department °Not all health departments have doctors that can see patients for sick visits, but many do, so it is worth a call to see if yours does. If you don't know where your local health department is, you can check in your phone book. The CDC also has a tool to help you locate your state's health department, and many state websites also have listings of all of their local health departments. ° °3) Find a Walk-in Clinic °  If your illness is not likely to be very severe or complicated, you may want to try a walk in clinic. These are popping up all over the country in pharmacies, drugstores, and shopping centers. They're usually staffed by nurse practitioners or physician assistants that have been trained to treat common illnesses and complaints. They're usually fairly quick and inexpensive. However, if you have serious medical issues or chronic medical problems, these are probably not your best option. ° °No Primary Care Doctor: °- Call  [facility] at  [phone] - they can help you locate a primary care doctor that  accepts your insurance, provides certain services, etc. °- Physician Referral Service- 1-800-533-3463 ° °Chronic Pain Problems: °Organization         Address  Phone   Notes  °[facility]  ([phone]) [phone] Patients need to be referred by their primary care doctor.  ° °Medication Assistance: °Organization         Address  Phone   Notes  °[place] [facility] [address] °[place], [place] [address] (336) 641-8030 --Must be a resident of [place] °-- Must have NO insurance coverage whatsoever (no Medicaid/ Medicare, etc.) °-- The pt. MUST have a primary care doctor that directs their care regularly and follows them in the community °  °[facility]  (866) 331-1348   °[employer]  (888) 892-1162   ° °Agencies that provide inexpensive medical care: °Organization         Address  Phone   Notes  °[facility]  ([phone]) [phone]   °[facility]    (336) 832-7272   °[facility] [address] °[place], [place] [address] ([phone]) [phone]   °[facility] [address], °[place] (336) [phone]   °[facility]    (336) 373-0678   °[facility]    (336) 272-1050   °[facility] ° [address], [place] Phone:  (336) [phone], Fax:  (336) [phone] Hours of Operation:  9 am - 6 pm, M-F.  Also accepts Medicaid/Medicare and self-pay.  °[facility] ° [address], [place] Phone: (336) [phone], Fax: ([phone]) [phone]. Hours of Operation:  8:30 am - 5:30 pm, M-F.  Also accepts Medicaid and self-pay.  °[facility] [place] [address], [place] Phone: (336) [phone]   °[facility] [address], [place], [place] (336)[phone], Ext. 123 Mondays & Thursdays: 7-9 AM.  First 15 patients are seen on a first come, first serve  basis. °  ° °Medicaid-accepting [place] Providers: ° °Organization         Address  Phone   Notes  °[facility] [address], [place] ([phone]) [phone] Also accepts self-pay patients.  °[facility] [address], [place] ° (336) [phone]   °[facility] [address], [place] (336) [phone]   °[facility] [address] [place] [address], [place] (336) [phone]   °[facility] [address], [place]  ° ([phone]) [phone] Only accepts [place] Access Medicaid patients after they have their name applied to their card.  ° °Self-Pay (no insurance) in [place]: ° °Organization         Address  Phone   Notes  °Sickle Cell Patients, [facility] [address], [place] ([phone])   [phone]   °[facility] [address], [place] (336) [phone]   °[facility] ° [address], [place] (336) [phone]   °[facility]/Dr. Osei-Bonsu ° [address] [place] [address], [place] or [address], [place] ([phone]) [phone] Phone number for both [place] and [place] locations is the same.  °[facility] [address], [place] ([phone]) [phone]   °[facility] [address] [place] [address], [place] or [address] (336) [phone] °(336) [phone]   °[facility] [address], [place] (336) 350-1642, phone; (336) [phone], fax Sees patients 1st and 3rd Saturday of every month.  Must not qualify for public or private insurance (i.e. Medicaid, Medicare, [place] Health Choice, Veterans' Benefits) • Household income should be no more than 200% of the poverty level •The clinic cannot treat you if you are pregnant or think you are pregnant • Sexually transmitted diseases are not treated at the clinic.  ° ° °Dental Care: °Organization         Address  Phone  Notes  °[place]  [facility] [address], [place] (336) [phone] Accepts children up to age 21 who are enrolled in Medicaid or [place] Health Choice; pregnant women with a Medicaid card; and children who have applied for Medicaid or [place] Health Choice, but were declined, whose parents can pay a reduced fee at time of service.  °[facility] [place]  [address], [place] (336) 641-7733 Accepts children up to age 21 who are enrolled in Medicaid or [place] Health Choice; pregnant women with a Medicaid card; and children who have applied for Medicaid or [place] Health Choice, but were declined, whose parents can pay a reduced fee at time of service.  °[facility] ° [address],  (336) [phone] Patients are seen by appointment only. Walk-ins are not accepted. [facility] will see patients 18 years of age and older. °Monday - Tuesday (8am-5pm) °Most Wednesdays (8:30-5pm) °$30 per visit, cash only  °[facility] ° [address], [place] ([phone]) [phone] Patients are seen by appointment only. Walk-ins are not accepted. [facility] will see patients 18 years of age and older. °One Wednesday Evening (Monthly: Volunteer Based).  $30 per visit, cash only  °[facility]  ([phone]) [phone] for adults; Children under age 4, call Graduate Pediatric Dentistry at ([phone]) [phone]. Children aged 4-14, please call ([phone]) [phone] to request a pediatric application. ° Dental services are provided in all areas of dental care including fillings, crowns and bridges, complete and partial dentures, implants, gum treatment, root canals, and extractions. Preventive care is also provided. Treatment is provided to both adults and children. °Patients are selected via a lottery and there is often a waiting list. °  °[facility] [address], ° ° (336) 763-8833  [email or web address] °  °[facility] [address], [place], [place] (336)723-1848, Ext. 123 Second and Fourth Thursday of each month, opens at 6:30 AM; Clinic ends at 9 AM.  Patients are seen on a first-come first-served basis, and a limited number are seen during each clinic.  ° °[facility] ° [address], [place], [place] ([phone]) [phone]   Eligibility Requirements °You must have lived in [place], [place], or [place] counties for   at least the last three months. °  You cannot be eligible for state or federal sponsored healthcare insurance, including Veterans Administration, Medicaid, or Medicare. °  You generally cannot be eligible for healthcare insurance through your employer.  °  How to apply: °Eligibility screenings are held every Tuesday and Wednesday afternoon from 1:00 pm until 4:00 pm. You do not need an appointment for the interview!  °[facility] [address], [place], [place] [phone]   °[facility]  [phone]   °[facility]  [phone]   °[facility]  [phone]   ° °

## 2014-08-26 NOTE — ED Notes (Signed)
Pt is c/o toothache on the left side  Pt states unsure if it is on the top or bottom  Pt states the whole left side of her face hurts  Pt has swelling noted

## 2015-05-19 LAB — PULMONARY FUNCTION TEST

## 2016-03-11 NOTE — Progress Notes (Deleted)
   Subjective:    Patient ID: Lisa Werner, female    DOB: 1980/08/05, 35 y.o.   MRN: [id number]  HPI pt is referred by for diabetes.  Pt states DM was dx'ed in; she has mild if any neuropathy of the lower extremities; she is unaware of any associated chronic complications; she has been on insulin since; pt says hier diet and exercise are; she has never had GDM, pancreatitis, severe hypoglycemia or DKA.   Review of Systems denies weight loss, blurry vision, headache, chest pain, sob, n/v, urinary frequency, muscle cramps, excessive diaphoresis, memory loss, depression, cold intolerance, rhinorrhea, and easy bruising     Objective:   Physical Exam VS: see vs page GEN: no distress HEAD: head: no deformity eyes: no periorbital swelling, no proptosis external nose and ears are normal mouth: no lesion seen NECK: supple, thyroid is not enlarged CHEST WALL: no deformity LUNGS: clear to auscultation CV: reg rate and rhythm, no murmur ABD: abdomen is soft, nontender.  no hepatosplenomegaly.  not distended.  no hernia MUSCULOSKELETAL: muscle bulk and strength are grossly normal.  no obvious joint swelling.  gait is normal and steady EXTEMITIES: no deformity.  no ulcer on the feet.  feet are of normal color and temp.  no edema PULSES: dorsalis pedis intact bilat.  no carotid bruit NEURO:  cn 2-12 grossly intact.   readily moves all 4's.  sensation is intact to touch on the feet SKIN:  Normal texture and temperature.  No rash or suspicious lesion is visible.   NODES:  None palpable at the neck PSYCH: alert, well-oriented.  Does not appear anxious nor depressed.   I have reviewed outside records, and summarized:  Pt was seen in ER in 2015, for severe hyperglycemia. She was noncompliant with insulin, and said she could not buy it, so she was given a vial.    CT: Liver, gallbladder, spleen, pancreas, adrenal glands, portal venous system and major arterial structures are within normal limits.       Assessment & Plan:

## 2016-03-13 ENCOUNTER — Ambulatory Visit: Payer: No Typology Code available for payment source | Admitting: Endocrinology

## 2016-03-13 DIAGNOSIS — Z0289 Encounter for other administrative examinations: Secondary | ICD-10-CM

## 2016-03-27 LAB — PULMONARY FUNCTION TEST

## 2017-02-23 ENCOUNTER — Encounter (HOSPITAL_COMMUNITY): Payer: Self-pay

## 2017-02-23 ENCOUNTER — Emergency Department (HOSPITAL_COMMUNITY)
Admission: EM | Admit: 2017-02-23 | Discharge: 2017-02-23 | Disposition: A | Discharge status: Home / Self Care | Payer: Managed Care, Other (non HMO) | Attending: Emergency Medicine | Admitting: Emergency Medicine

## 2017-02-23 DIAGNOSIS — J029 Acute pharyngitis, unspecified: Secondary | ICD-10-CM | POA: Insufficient documentation

## 2017-02-23 DIAGNOSIS — Z794 Long term (current) use of insulin: Secondary | ICD-10-CM | POA: Diagnosis not present

## 2017-02-23 DIAGNOSIS — E119 Type 2 diabetes mellitus without complications: Secondary | ICD-10-CM | POA: Diagnosis not present

## 2017-02-23 DIAGNOSIS — R05 Cough: Secondary | ICD-10-CM | POA: Diagnosis not present

## 2017-02-23 DIAGNOSIS — Z79899 Other long term (current) drug therapy: Secondary | ICD-10-CM | POA: Diagnosis not present

## 2017-02-23 NOTE — ED Triage Notes (Signed)
Pt complains of a sore throat and dry cough for about one hour

## 2017-02-23 NOTE — ED Provider Notes (Signed)
[facility]-EMERGENCY DEPT Provider Note   CSN: [id number] Arrival date & time: 02/23/17  0228     History   Chief Complaint Chief Complaint  Patient presents with  . Sore Throat    HPI Lisa Werner is a 36 y.o. female.  Patient presents to the emergency department with a chief complaint of sore throat.  She states that she woke this morning with a sore throat, and felt like it was hard to swallow.  She was concerned that her throat was tightening.  She states that she drinks water and now feels normal.  However, she is concerned about the symptoms, and decided to come to the emergency department.  She denies any fevers or chills.  She states that she has had a dry cough, but attributes this to the cold, from what she is recovering.  She denies any other associated symptoms.     The history is provided by the patient. No language interpreter was used.    Past Medical History:  Diagnosis Date  . Diabetes mellitus without complication [facility](HCC)     Patient Active Problem List   Diagnosis Date Noted  . Diabetes (HCC) 09/28/2013    History reviewed. No pertinent surgical history.  OB History    No data available       Home Medications    Prior to Admission medications   Medication Sig Start Date End Date Taking? Authorizing Provider  DM-Phenylephrine-Acetaminophen (VICKS DAYQUIL COLD & FLU) 10-5-325 MG CAPS Take 2 capsules by mouth every 4 (four) hours as needed (cold, congestion).   Yes [provider]  HUMULIN 70/30 KWIKPEN (70-30) 100 UNIT/ML PEN Inject 50 Units into the skin 2 (two) times daily. 01/14/17  Yes [provider]  furosemide (LASIX) 20 MG tablet Take 1 tablet (20 mg total) by mouth daily. Patient not taking: Reported on 04/01/2014 11/07/13   Lurene ShadowPhelps, Erin O, [name]  insulin lispro protamine-insulin lispro (HUMALOG 75/25) (75-25) 100 UNIT/ML SUSP Inject 40 Units into the skin 2 (two) times daily with a meal. Patient not  taking: Reported on 02/23/2017 02/25/12   Cathren LaineSteinl, Kevin, MD    Family History History reviewed. No pertinent family history.  Social History Social History  Substance Use Topics  . Smoking status: Never Smoker  . Smokeless tobacco: Never Used  . Alcohol use No     Allergies   Patient has no known allergies.   Review of Systems Review of Systems  All other systems reviewed and are negative.    Physical Exam Updated Vital Signs BP (!) 125/100 (BP Location: Right Arm)   Pulse (!) 110   Temp 97.8 F (36.6 C) (Oral)   Resp 20   LMP 01/29/2017   SpO2 95%   Physical Exam  Constitutional: She is oriented to person, place, and time. She appears well-developed and well-nourished.  HENT:  Head: Normocephalic and atraumatic.  Oropharynx is clear, no erythema, edema, or sign of abscess, no stridor, normal phonation  Eyes: Conjunctivae and EOM are normal.  Neck: Normal range of motion.  Cardiovascular: Normal rate, regular rhythm and normal heart sounds.   Pulmonary/Chest: Effort normal and breath sounds normal. No respiratory distress. She has no wheezes. She has no rales. She exhibits no tenderness.  Clear to auscultation, no wheezing  Abdominal: She exhibits no distension.  Musculoskeletal: Normal range of motion.  Neurological: She is alert and oriented to person, place, and time.  Skin: Skin is dry.  Psychiatric: She has a normal  mood and affect. Her behavior is normal. Judgment and thought content normal.  Nursing note and vitals reviewed.    ED Treatments / Results  Labs (all labs ordered are listed, but only abnormal results are displayed) Labs Reviewed - No data to display  EKG  EKG Interpretation None       Radiology No results found.  Procedures Procedures (including critical care time)  Medications Ordered in ED Medications - No data to display   Initial Impression / Assessment and Plan / ED Course  I have reviewed the triage vital signs and  the nursing notes.  Pertinent labs & imaging results that were available during my care of the patient were reviewed by me and considered in my medical decision making (see chart for details).     Patient with sore throat and difficulty swallowing when she first awoke this morning.  She drinks water and had improvement of her symptoms.  She is now symptom-free.  Her oropharynx is clear.  She has no evidence of airway compromise.  She is tolerating orals.  She is well-appearing.  Discharged home with PCP follow-up.  Final Clinical Impressions(s) / ED Diagnoses   Final diagnoses:  Sore throat    New Prescriptions New Prescriptions   No medications on file     Roxy Horseman, Cordelia Poche 02/23/17 1610    Molpus, Jonny Ruiz, MD 02/23/17 [phone]

## 2017-03-31 ENCOUNTER — Emergency Department (HOSPITAL_COMMUNITY): Payer: Managed Care, Other (non HMO)

## 2017-03-31 ENCOUNTER — Encounter (HOSPITAL_COMMUNITY): Payer: Self-pay | Admitting: Emergency Medicine

## 2017-03-31 ENCOUNTER — Emergency Department (HOSPITAL_COMMUNITY)
Admission: EM | Admit: 2017-03-31 | Discharge: 2017-04-01 | Disposition: A | Discharge status: Home / Self Care | Payer: Managed Care, Other (non HMO) | Attending: Emergency Medicine | Admitting: Emergency Medicine

## 2017-03-31 DIAGNOSIS — S8391XA Sprain of unspecified site of right knee, initial encounter: Secondary | ICD-10-CM | POA: Diagnosis not present

## 2017-03-31 DIAGNOSIS — E119 Type 2 diabetes mellitus without complications: Secondary | ICD-10-CM | POA: Diagnosis not present

## 2017-03-31 DIAGNOSIS — S8991XA Unspecified injury of right lower leg, initial encounter: Secondary | ICD-10-CM | POA: Diagnosis present

## 2017-03-31 DIAGNOSIS — Z794 Long term (current) use of insulin: Secondary | ICD-10-CM | POA: Insufficient documentation

## 2017-03-31 DIAGNOSIS — Y9389 Activity, other specified: Secondary | ICD-10-CM | POA: Diagnosis not present

## 2017-03-31 DIAGNOSIS — Y92410 Unspecified street and highway as the place of occurrence of the external cause: Secondary | ICD-10-CM | POA: Insufficient documentation

## 2017-03-31 DIAGNOSIS — Y999 Unspecified external cause status: Secondary | ICD-10-CM | POA: Diagnosis not present

## 2017-03-31 MED ORDER — HYDROCODONE-ACETAMINOPHEN 5-325 MG PO TABS
2.0000 | ORAL_TABLET | Freq: Once | ORAL | Status: AC
  Administered 2017-03-31: 2 via ORAL
  Filled 2017-03-31: qty 2
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
## 2017-04-01 MED ORDER — NAPROXEN 375 MG PO TABS: 375.0000 mg | ORAL_TABLET | Freq: Two times a day (BID) | ORAL | 0 refills | Status: AC | PRN

## 2017-04-01 MED ORDER — METHOCARBAMOL 500 MG PO TABS
500.0000 mg | ORAL_TABLET | Freq: Three times a day (TID) | ORAL | 0 refills | Status: DC | PRN
Start: 2017-04-01 — End: 2017-07-17

## 2017-05-31 ENCOUNTER — Ambulatory Visit: Payer: Managed Care, Other (non HMO) | Admitting: Nurse Practitioner

## 2017-05-31 ENCOUNTER — Encounter: Payer: Self-pay | Admitting: Nurse Practitioner

## 2017-05-31 VITALS — BP 134/90 | HR 97 | Temp 97.7°F | Ht 62.0 in | Wt 210.0 lb

## 2017-05-31 DIAGNOSIS — E138 Other specified diabetes mellitus with unspecified complications: Secondary | ICD-10-CM | POA: Diagnosis not present

## 2017-05-31 DIAGNOSIS — Z124 Encounter for screening for malignant neoplasm of cervix: Secondary | ICD-10-CM | POA: Diagnosis not present

## 2017-05-31 DIAGNOSIS — Z0001 Encounter for general adult medical examination with abnormal findings: Secondary | ICD-10-CM

## 2017-05-31 DIAGNOSIS — H5712 Ocular pain, left eye: Secondary | ICD-10-CM

## 2017-05-31 DIAGNOSIS — Z794 Long term (current) use of insulin: Secondary | ICD-10-CM | POA: Diagnosis not present

## 2017-05-31 DIAGNOSIS — E782 Mixed hyperlipidemia: Secondary | ICD-10-CM

## 2017-05-31 DIAGNOSIS — R350 Frequency of micturition: Secondary | ICD-10-CM | POA: Diagnosis not present

## 2017-05-31 LAB — COMPREHENSIVE METABOLIC PANEL
ALT: 14 U/L (ref 0–35)
AST: 12 U/L (ref 0–37)
Albumin: 3.6 g/dL (ref 3.5–5.2)
Alkaline Phosphatase: 100 U/L (ref 39–117)
BUN: 10 mg/dL (ref 6–23)
CHLORIDE: 99 meq/L (ref 96–112)
CO2: 25 meq/L (ref 19–32)
CREATININE: 0.77 mg/dL (ref 0.40–1.20)
Calcium: 9 mg/dL (ref 8.4–10.5)
GFR: 108.9 mL/min (ref >60.00)
Glucose, Bld: 345 mg/dL — ABNORMAL HIGH (ref 70–99)
Potassium: 4.3 mEq/L (ref 3.5–5.1)
Sodium: 132 mEq/L — ABNORMAL LOW (ref 135–145)
Total Bilirubin: 0.4 mg/dL (ref 0.2–1.2)
Total Protein: 7.2 g/dL (ref 6.0–8.3)

## 2017-05-31 LAB — LIPID PANEL
Cholesterol: 236 mg/dL — ABNORMAL HIGH (ref 0–200)
HDL: 33.1 mg/dL — ABNORMAL LOW (ref >39.00)
LDL Cholesterol: 181 mg/dL — ABNORMAL HIGH (ref 0–99)
NONHDL: 203.26
Total CHOL/HDL Ratio: 7
Triglycerides: 113 mg/dL (ref 0.0–149.0)
VLDL: 22.6 mg/dL (ref 0.0–40.0)

## 2017-05-31 LAB — TSH: TSH: 1.66 u[IU]/mL (ref 0.35–4.50)

## 2017-05-31 MED ORDER — BLOOD GLUCOSE MONITOR KIT
PACK | 3 refills | Status: DC
Start: 2017-05-31 — End: 2017-10-25

## 2017-05-31 NOTE — Progress Notes (Signed)
Subjective:    Patient ID: Lisa Werner, female    DOB: [birth year], 37 y.o.   MRN: [id number]  Patient presents today for complete physical  Previous pcp with [facility]: Dr. Armanda Heritage. Does not remember when she was last seen.  Diabetes  She presents for her initial diabetic visit. She has type 1 diabetes mellitus. No MedicAlert identification noted. Her disease course has been worsening. Pertinent negatives for hypoglycemia include no confusion, dizziness, headaches, hunger, mood changes, nervousness/anxiousness, pallor, seizures, sleepiness, speech difficulty, sweats or tremors. Associated symptoms include blurred vision, fatigue, polyphagia, polyuria and visual change. Pertinent negatives for diabetes include no chest pain, no foot paresthesias, no foot ulcerations, no polydipsia, no weakness and no weight loss. There are no hypoglycemic complications. Symptoms are stable. Risk factors for coronary artery disease include diabetes mellitus, family history, obesity and sedentary lifestyle. Current diabetic treatment includes insulin injections. She is compliant with treatment some of the time. Her weight is increasing steadily. She is following a high fat/cholesterol and high salt diet. When asked about meal planning, she reported none. She has not had a previous visit with a dietitian. She never participates in exercise. Home blood sugar record trend: has not checked in 61month An ACE inhibitor/angiotensin II receptor blocker is not being taken. She does not see a podiatrist.Eye exam is not current.  Eye Pain   The left eye is affected. This is a new problem. The current episode started more than 1 month ago. The problem occurs intermittently. The problem has been waxing and waning. There was no injury mechanism. Associated symptoms include blurred vision and eye redness. Pertinent negatives include no eye discharge, double vision, fever, foreign body sensation, itching, nausea,  photophobia, recent URI, vomiting or weakness. She has tried nothing for the symptoms.   Immunizations: (TDAP, Hep C screen, Pneumovax, Influenza, zoster)  Health Maintenance  Topic Date Due  . Eye exam for diabetics  02/13/1991  . Urine Protein Check  02/13/1991  . Pap Smear  02/12/2002  . Hemoglobin A1C  06/22/2010  . Flu Shot  01/31/2018*  . Pneumococcal vaccine (1) 05/31/2018*  . Tetanus Vaccine  05/31/2018*  . HIV Screening  05/31/2018*  . Complete foot exam   05/31/2018  *Topic was postponed. The date shown is not the original due date.   Diet:regular.  Weight:  Wt Readings from Last 3 Encounters:  05/31/17 210 lb (95.3 kg)  03/31/17 212 lb 11.2 oz (96.5 kg)  04/01/14 197 lb (89.4 kg)   Exercise:none.  Fall Risk: Fall Risk  05/31/2017  Falls in the past year? No   Home Safety:home alone.  Depression/Suicide: Depression screen PHQ 2/9 05/31/2017  Decreased Interest 0  Down, Depressed, Hopeless 0  PHQ - 2 Score 0   Pap Smear (every 363yrfor >21-29 without HPV, every 5y85yror >30-65y61yrth HPV):needed, last done 3years ago, normal per patient, no hx of abnormal.  Vision:needed Dental:needed Advanced Directive: Advanced Directives 02/23/2017  Does Patient Have a Medical Advance Directive? No  Would patient like information on creating a medical advance directive? -   Sexual History (birth control, marital status, STD): not sexually active, LMP 05/19/2017 x 7days, regular per patient  Medications and allergies reviewed with patient and updated if appropriate.  Patient Active Problem List   Diagnosis Date Noted  . Mixed hyperlipidemia 06/03/2017  . Pain of left eye 06/03/2017  . Urinary frequency 06/03/2017  . Diabetes (HCC)[place]/04/2013    Current Outpatient Medications on File Prior  to Visit  Medication Sig Dispense Refill  . DM-Phenylephrine-Acetaminophen (VICKS DAYQUIL COLD & FLU) 10-5-325 MG CAPS Take 2 capsules by mouth every 4 (four) hours as needed  (cold, congestion).    . furosemide (LASIX) 20 MG tablet Take 1 tablet (20 mg total) by mouth daily. (Patient not taking: Reported on 04/01/2014) 30 tablet 0  . insulin lispro protamine-insulin lispro (HUMALOG 75/25) (75-25) 100 UNIT/ML SUSP Inject 40 Units into the skin 2 (two) times daily with a meal. (Patient not taking: Reported on 02/23/2017) 10 mL 12  . methocarbamol (ROBAXIN) 500 MG tablet Take 1 tablet (500 mg total) by mouth every 8 (eight) hours as needed for muscle spasms. (Patient not taking: Reported on 05/31/2017) 30 tablet 0   No current facility-administered medications on file prior to visit.     Past Medical History:  Diagnosis Date  . Diabetes mellitus without complication ([place])     History reviewed. No pertinent surgical history.  Social History   Socioeconomic History  . Marital status: Single    Spouse name: None  . Number of children: None  . Years of education: None  . Highest education level: None  Social Needs  . Financial resource strain: None  . Food insecurity - worry: None  . Food insecurity - inability: None  . Transportation needs - medical: None  . Transportation needs - non-medical: None  Occupational History  . None  Tobacco Use  . Smoking status: Never Smoker  . Smokeless tobacco: Never Used  Substance and Sexual Activity  . Alcohol use: No  . Drug use: No  . Sexual activity: No  Other Topics Concern  . None  Social History Narrative  . None    Family History  Problem Relation Age of Onset  . Arthritis Mother   . Kidney disease Mother   . Asthma Father         Review of Systems  Constitutional: Positive for fatigue. Negative for fever, malaise/fatigue and weight loss.  HENT: Negative for congestion and sore throat.   Eyes: Positive for blurred vision, pain and redness. Negative for double vision, photophobia, discharge and itching.       Negative for visual changes  Respiratory: Negative for cough and shortness of breath.     Cardiovascular: Negative for chest pain, palpitations and leg swelling.  Gastrointestinal: Negative for blood in stool, constipation, diarrhea, heartburn, nausea and vomiting.  Genitourinary: Negative for dysuria, frequency and urgency.  Musculoskeletal: Negative for falls, joint pain and myalgias.  Skin: Negative for pallor and rash.  Neurological: Negative for dizziness, tremors, sensory change, seizures, speech difficulty, weakness and headaches.  Endo/Heme/Allergies: Positive for polyphagia. Negative for polydipsia. Does not bruise/bleed easily.  Psychiatric/Behavioral: Negative for confusion, depression, substance abuse and suicidal ideas. The patient is not nervous/anxious.     Objective:   Vitals:   05/31/17 1328  BP: 134/90  Pulse: 97  Temp: 97.7 F (36.5 C)  SpO2: 96%    Body mass index is 38.41 kg/m.   Physical Examination:  Physical Exam  Constitutional: She is oriented to person, place, and time and well-developed, well-nourished, and in no distress. No distress.  HENT:  Right Ear: External ear normal.  Left Ear: External ear normal.  Nose: Nose normal.  Mouth/Throat: Oropharynx is clear and moist. No oropharyngeal exudate.  Eyes: Conjunctivae and EOM are normal. Pupils are equal, round, and reactive to light. No scleral icterus.  Neck: Normal range of motion. Neck supple. No thyromegaly present.  Cardiovascular: Normal  rate, normal heart sounds and intact distal pulses.  Pulmonary/Chest: Effort normal and breath sounds normal. She exhibits no tenderness.  Abdominal: Soft. Bowel sounds are normal. She exhibits no distension. There is no tenderness.  Genitourinary:  Genitourinary Comments: Deferred breast and pelvic exam to GYN per patient  Musculoskeletal: Normal range of motion. She exhibits no edema or tenderness.  Lymphadenopathy:    She has no cervical adenopathy.  Neurological: She is alert and oriented to person, place, and time. Gait normal.  Skin:  Skin is warm and dry.  Psychiatric: Affect and judgment normal.    ASSESSMENT and PLAN:  Jasmeen was seen today for establish care and eye pain.  Diagnoses and all orders for this visit:  Encounter for preventative adult health care exam with abnormal findings -     Comprehensive metabolic panel -     Cancel: CBC -     TSH -     Lipid panel -     Cancel: CBC -     Cancel: Hemoglobin A1c -     CBC; Future -     Hemoglobin A1c; Future  Other specified diabetes mellitus with complication, with long-term current use of insulin (HCC) -     Cancel: Hemoglobin T7D -     Cyclic citrul peptide antibody, IgG -     Ambulatory referral to Ophthalmology -     blood glucose meter kit and supplies KIT; Dispense based on patient and insurance preference. Use three times a day (before meals). ICDE13.8 -     Hemoglobin A1c; Future -     C-peptide; Future -     HUMULIN 70/30 KWIKPEN (70-30) 100 UNIT/ML PEN; Inject 50 Units into the skin 2 (two) times daily. -     Insulin Pen Needle (PEN NEEDLES) 31G X 6 MM MISC; 1 application by Does not apply route 2 (two) times daily at 8 am and 10 pm.  Mixed hyperlipidemia -     Lipid panel  Pain of left eye -     Ambulatory referral to Ophthalmology  Encounter for Papanicolaou smear for cervical cancer screening -     Ambulatory referral to Obstetrics / Gynecology  Urinary frequency -     Urinalysis w microscopic + reflex cultur -     fluconazole (DIFLUCAN) 150 MG tablet; Take 1 tablet (150 mg total) by mouth once for 1 dose.  Other orders -     REFLEXIVE URINE CULTURE -     Urine Culture   No problem-specific Assessment & Plan notes found for this encounter.    Recent Results (from the past 2160 hour(s))  Comprehensive metabolic panel     Status: Abnormal   Collection Time: 05/31/17  2:12 PM  Result Value Ref Range   Sodium 132 (L) 135 - 145 mEq/L   Potassium 4.3 3.5 - 5.1 mEq/L   Chloride 99 96 - 112 mEq/L   CO2 25 19 - 32 mEq/L    Glucose, Bld 345 (H) 70 - 99 mg/dL   BUN 10 6 - 23 mg/dL   Creatinine, Ser 0.77 0.40 - 1.20 mg/dL   Total Bilirubin 0.4 0.2 - 1.2 mg/dL   Alkaline Phosphatase 100 39 - 117 U/L   AST 12 0 - 37 U/L   ALT 14 0 - 35 U/L   Total Protein 7.2 6.0 - 8.3 g/dL   Albumin 3.6 3.5 - 5.2 g/dL   Calcium 9.0 8.4 - 10.5 mg/dL   GFR 108.90 >60.00 mL/min  TSH     Status: None   Collection Time: 05/31/17  2:12 PM  Result Value Ref Range   TSH 1.66 0.35 - 4.50 uIU/mL  Urinalysis w microscopic + reflex cultur     Status: Abnormal   Collection Time: 05/31/17  2:12 PM  Result Value Ref Range   Color, Urine YELLOW YELLOW   APPearance CLEAR CLEAR   Specific Gravity, Urine 1.036 (H) 1.001 - 1.03   pH 6.5 5.0 - 8.0   Glucose, UA 3+ (A) NEGATIVE   Bilirubin Urine NEGATIVE NEGATIVE   Ketones, ur 2+ (A) NEGATIVE   Hgb urine dipstick NEGATIVE NEGATIVE   Protein, ur 3+ (A) NEGATIVE   Nitrites, Initial NEGATIVE NEGATIVE   Leukocyte Esterase NEGATIVE NEGATIVE   WBC, UA 0-5 0 - 5 /HPF   RBC / HPF NONE SEEN 0 - 2 /HPF   Squamous Epithelial / LPF 0-5 < OR = 5 /HPF   Bacteria, UA NONE SEEN NONE SEEN /HPF   Hyaline Cast NONE SEEN NONE SEEN /LPF   Yeast FEW (A) NONE SEEN /HPF  Lipid panel     Status: Abnormal   Collection Time: 05/31/17  2:12 PM  Result Value Ref Range   Cholesterol 236 (H) 0 - 200 mg/dL    Comment: ATP III Classification       Desirable:  < 200 mg/dL               Borderline High:  200 - 239 mg/dL          High:  > = 240 mg/dL   Triglycerides 113.0 0.0 - 149.0 mg/dL    Comment: Normal:  <150 mg/dLBorderline High:  150 - 199 mg/dL   HDL 33.10 (L) >39.00 mg/dL   VLDL 22.6 0.0 - 40.0 mg/dL   LDL Cholesterol 181 (H) 0 - 99 mg/dL   Total CHOL/HDL Ratio 7     Comment:                Men          Women1/2 Average Risk     3.4          3.3Average Risk          5.0          4.42X Average Risk          9.6          7.13X Average Risk          15.0          11.0                       NonHDL 203.26      Comment: NOTE:  Non-HDL goal should be 30 mg/dL higher than patient's LDL goal (i.e. LDL goal of < 70 mg/dL, would have non-HDL goal of < 517 mg/dL)  Cyclic citrul peptide antibody, IgG     Status: None   Collection Time: 05/31/17  2:12 PM  Result Value Ref Range   Cyclic Citrullin Peptide Ab <16 UNITS    Comment: Reference Range Negative:            <20 Weak Positive:       20-39 Moderate Positive:   40-59 Strong Positive:     >59 .   REFLEXIVE URINE CULTURE     Status: None   Collection Time: 05/31/17  2:12 PM  Result Value Ref Range   REFLEXIVE URINE CULTURE CULTURE INDICATED -  RESULTS TO FOLLOW   Urine Culture     Status: None   Collection Time: 05/31/17  2:12 PM  Result Value Ref Range   MICRO NUMBER: [id number]    SPECIMEN QUALITY: ADEQUATE    Sample Source URINE    STATUS: FINAL    Result: No Growth    Follow up: Return in about 1 month (around 06/28/2017) for DM.  Wilfred Lacy, NP

## 2017-05-31 NOTE — Patient Instructions (Addendum)
Urine indicates need to push oral hydration. Urine is also positive for yeast. Diflucan sent. Normal TSH. Lipid panel indicates elevated LDL and total cholesterol. These numbers can improve if you make chnages to diet and increase exercise. CMP indicates elevated glucose, normal renal and hepatic function. CBC, A1c and C-petide till pending. 1pen of Humulin sent while waiting for you to return to lab for additional blood draw. Additional refill will be provided after review of A1c and C-peptide.  It is important for you to check glucose as instructed. F/up in 67month  Start glucose check as discussed. Bring glucose readings to next office visit.  You will be contacted to schedule appt with GYN and ophthalmology.  Type 1 Diabetes Mellitus, Self Care, Adult When you have type 1 diabetes (type 1 diabetes mellitus), you must keep your blood sugar (glucose) under control. You can do this with:  Insulin.  Nutrition.  Exercise.  Lifestyle changes.  Other medicines, if needed.  Support from your doctors and others.  How do I manage my blood sugar?  Check your blood sugar every day, as often as told.  Call your doctor if your blood sugar is above your goal numbers for 2 tests in a row.  Have your A1c (hemoglobin A1c) level checked at least twice a year. Have it checked more often if your doctor tells you to. Your doctor will set treatment goals for you. Generally, you should have these blood sugar levels:  Before meals (preprandial): 80-130 mg/dL (4.4-7.2 mmol/L).  After meals (postprandial): below 180 mg/dL (10 mmol/L).  A1c level: less than 7%.  What do I need to know about high blood sugar? High blood sugar is called hyperglycemia. Know the signs of high blood sugar. Signs may include:  Feeling: ? Thirsty. ? Hungry. ? Very tired.  Needing to pee (urinate) more than usual.  Blurry vision.  What do I need to know about low blood sugar? Low blood sugar is called  hypoglycemia. This is when blood sugar is at or below 70 mg/dL (3.9 mmol/L). Symptoms may include:  Feeling: ? Hungry. ? Worried or nervous (anxious). ? Sweaty and clammy. ? Confused. ? Dizzy. ? Sleepy. ? Sick to your stomach (nauseous).  Having: ? A fast heartbeat. ? A headache. ? A change in your vision. ? Jerky movements that you cannot control (seizure). ? Nightmares. ? Tingling or no feeling (numbness) around the mouth, lips, or tongue.  Having trouble with: ? Talking. ? Paying attention (concentrating). ? Moving (coordination). ? Sleeping.  Shaking.  Passing out (fainting).  Getting upset easily (irritability).  Treating low blood sugar  To treat low blood sugar, eat or drink something sugary right away. If you can think clearly and swallow safely, follow the 15:15 rule:  Take 15 grams of a fast-acting carb (carbohydrate). Some fast-acting carbs are: ? 1 tube of glucose gel. ? 3 sugar tablets (glucose pills). ? 6-8 pieces of hard candy. ? 4 oz (120 mL) of fruit juice. ? 4 oz (120 mL) regular (not diet) soda.  Check your blood sugar 15 minutes after you take the carb.  If your blood sugar is still at or below 70 mg/dL (3.9 mmol/L), take 15 grams of a carb again.  If your blood sugar does not go above 70 mg/dL (3.9 mmol/L) after 3 tries, get help right away.  After your blood sugar goes back to normal, eat a meal or a snack within 1 hour.  Treating very low blood sugar If your blood  sugar is at or below 54 mg/dL (3 mmol/L), you have very low blood sugar (severe hypoglycemia). This is an emergency. Do not wait to see if the symptoms will go away. Get medical help right away. Call your local emergency services (911 in the [place] Do not drive yourself to the hospital. If you have very low blood sugar and you cannot eat or drink, you may need a glucagon shot (injection). A family member or friend should learn how to check your blood sugar and how to give you a  glucagon shot. Ask your doctor if you need to have a glucagon shot kit at home. What else is important to manage my diabetes? Medicine  Take insulin and diabetes medicines as told.  Adjust your insulin and medicines as told.  Do not run out of insulin or medicines. Having diabetes can put you at risk for other long-term (chronic) conditions. These may include heart disease and kidney disease. Your doctor may prescribe medicines to help prevent problems from diabetes. Food  Make healthy food choices. These include: ? Chicken, fish, egg whites, and beans. ? Oats, whole wheat, bulgur, brown rice, quinoa, and millet. ? Fresh fruits and vegetables. ? Low-fat dairy products. ? Nuts, avocado, olive oil, and canola oil.  Meet with a food specialist (registered dietitian). He or she can help you make an eating plan that is right for you.  Follow instructions from your doctor about what you cannot eat or drink.  Drink enough fluid to keep your pee (urine) clear or pale yellow.  Eat healthy snacks between healthy meals.  Keep track of carbs that you eat. Do this by reading food labels and learning food serving sizes.  Follow your sick day plan when you cannot eat or drink normally. Make this plan with your doctor so it is ready to use. Activity   Exercise at least 3 times a week.  Do not go more than 2 days without exercising.  Talk with your doctor before you start a new exercise. Your doctor may need to adjust your insulin, medicines, or food. Lifestyle   Do not use any tobacco products. These include cigarettes, chewing tobacco, and e-cigarettes. If you need help quitting, ask your doctor.  Ask your doctor how much alcohol is safe for you.  Learn to deal with stress. If you need help with this, ask your doctor. Body care  Stay up to date with your shots (immunizations).  Have your eyes and feet checked by a doctor as often as told.  Check your skin and feet every day.  Check for cuts, bruises, redness, blisters, or sores.  Brush your teeth and gums two times a day, and floss at least one time a day.  Go to the dentist least one time every 6 months.  Stay at a healthy weight. General instructions   Take over-the-counter and prescription medicines only as told by your doctor.  Share your diabetes care plan with: ? Your work or school. ? People you live with.  Check your pee (urine) for ketones: ? When you are sick. ? As told by your doctor.  Carry a card or wear jewelry that says that you have diabetes.  Ask your doctor: ? Do I need to meet with a diabetes educator? ? Where can I find a support group for people with diabetes?  Keep all follow-up visits as told by your doctor. This is important. Where to find more information: To learn more about diabetes, visit:  American  Diabetes Association: www.diabetes.org  American Association of Diabetes Educators: www.diabeteseducator.org/patient-resources  This information is not intended to replace advice given to you by your health care provider. Make sure you discuss any questions you have with your health care provider. Document Released: 08/08/2015 Document Revised: 09/22/2015 Document Reviewed: 05/20/2015 Elsevier Interactive Patient Education  [employer].

## 2017-06-03 ENCOUNTER — Telehealth: Payer: Self-pay

## 2017-06-03 ENCOUNTER — Encounter: Payer: Self-pay | Admitting: Nurse Practitioner

## 2017-06-03 DIAGNOSIS — H5712 Ocular pain, left eye: Secondary | ICD-10-CM | POA: Insufficient documentation

## 2017-06-03 DIAGNOSIS — E782 Mixed hyperlipidemia: Secondary | ICD-10-CM | POA: Insufficient documentation

## 2017-06-03 DIAGNOSIS — R350 Frequency of micturition: Secondary | ICD-10-CM | POA: Insufficient documentation

## 2017-06-03 LAB — URINALYSIS W MICROSCOPIC + REFLEX CULTURE
Bacteria, UA: NONE SEEN /HPF
Bilirubin Urine: NEGATIVE
HGB URINE DIPSTICK: NEGATIVE
HYALINE CAST: NONE SEEN /LPF
Leukocyte Esterase: NEGATIVE
NITRITES URINE, INITIAL: NEGATIVE
RBC / HPF: NONE SEEN /HPF (ref 0–2)
Specific Gravity, Urine: 1.036 — ABNORMAL HIGH (ref 1.001–1.03)
pH: 6.5 (ref 5.0–8.0)

## 2017-06-03 LAB — URINE CULTURE
MICRO NUMBER: 90144833
RESULT: NO GROWTH
SPECIMEN QUALITY:: ADEQUATE

## 2017-06-03 LAB — CULTURE INDICATED

## 2017-06-03 LAB — CYCLIC CITRUL PEPTIDE ANTIBODY, IGG

## 2017-06-03 MED ORDER — HUMULIN 70/30 KWIKPEN (70-30) 100 UNIT/ML ~~LOC~~ SUPN: 50.0000 [IU] | PEN_INJECTOR | Freq: Two times a day (BID) | SUBCUTANEOUS | 0 refills | Status: DC

## 2017-06-03 MED ORDER — FLUCONAZOLE 150 MG PO TABS: 150.0000 mg | ORAL_TABLET | Freq: Once | ORAL | 0 refills | Status: AC

## 2017-06-03 MED ORDER — PEN NEEDLES 31G X 6 MM MISC: 1.0000 "application " | Freq: Two times a day (BID) | 0 refills | Status: DC

## 2017-06-03 NOTE — Telephone Encounter (Signed)
Pt will come back in for labs

## 2017-06-04 ENCOUNTER — Other Ambulatory Visit: Payer: Self-pay | Admitting: Nurse Practitioner

## 2017-06-04 ENCOUNTER — Other Ambulatory Visit (INDEPENDENT_AMBULATORY_CARE_PROVIDER_SITE_OTHER): Payer: Managed Care, Other (non HMO)

## 2017-06-04 ENCOUNTER — Other Ambulatory Visit: Payer: Self-pay

## 2017-06-04 DIAGNOSIS — E138 Other specified diabetes mellitus with unspecified complications: Secondary | ICD-10-CM | POA: Diagnosis not present

## 2017-06-04 DIAGNOSIS — Z794 Long term (current) use of insulin: Principal | ICD-10-CM

## 2017-06-04 DIAGNOSIS — E1065 Type 1 diabetes mellitus with hyperglycemia: Secondary | ICD-10-CM

## 2017-06-04 DIAGNOSIS — Z0001 Encounter for general adult medical examination with abnormal findings: Secondary | ICD-10-CM

## 2017-06-04 LAB — HEMOGLOBIN A1C: Hgb A1c MFr Bld: 12.6 % — ABNORMAL HIGH (ref 4.6–6.5)

## 2017-06-04 LAB — CBC
HEMATOCRIT: 42.1 % (ref 36.0–46.0)
HEMOGLOBIN: 13.4 g/dL (ref 12.0–15.0)
MCHC: 31.8 g/dL (ref 30.0–36.0)
MCV: 88.5 fl (ref 78.0–100.0)
Platelets: 308 10*3/uL (ref 150.0–400.0)
RBC: 4.76 Mil/uL (ref 3.87–5.11)
RDW: 14.4 % (ref 11.5–15.5)
WBC: 12.3 10*3/uL — AB (ref 4.0–10.5)

## 2017-06-04 LAB — C-PEPTIDE: C-Peptide: 2.22 ng/mL (ref 0.80–3.85)

## 2017-06-04 MED ORDER — INSULIN ASPART 100 UNIT/ML FLEXPEN: PEN_INJECTOR | SUBCUTANEOUS | 1 refills | Status: DC

## 2017-06-04 MED ORDER — BASAGLAR KWIKPEN 100 UNIT/ML ~~LOC~~ SOPN: 10.0000 [IU] | PEN_INJECTOR | Freq: Every day | SUBCUTANEOUS | 0 refills | Status: DC

## 2017-06-04 MED ORDER — PEN NEEDLES 31G X 6 MM MISC: 1.0000 "application " | Freq: Two times a day (BID) | 5 refills | Status: DC

## 2017-06-05 ENCOUNTER — Encounter: Payer: Self-pay | Admitting: Nurse Practitioner

## 2017-06-07 ENCOUNTER — Telehealth: Payer: Self-pay | Admitting: Nurse Practitioner

## 2017-06-07 NOTE — Telephone Encounter (Signed)
PA started, waiting for the result  Lisa Werner (Key: [id number])

## 2017-06-07 NOTE — Telephone Encounter (Signed)
FYI    Copied from CRM [phone]. Topic: General - Other >> Jun 07, 2017  3:26 PM Gerrianne ScalePayne, Angela L wrote: Reason for CRM: patient was a NO SHOW at Dr [facility]le [facility]  office his assistant Olegario MessierKathy  called to let the provider know office

## 2017-06-07 NOTE — Telephone Encounter (Signed)
Proceed with PA 

## 2017-06-07 NOTE — Telephone Encounter (Signed)
Received a massage from pharmacy  Stating Novolog flex pen need PA. Do you want to change to something else or start PA? Pleas advise.

## 2017-06-10 ENCOUNTER — Ambulatory Visit: Payer: Managed Care, Other (non HMO) | Admitting: Nurse Practitioner

## 2017-06-10 DIAGNOSIS — Z0289 Encounter for other administrative examinations: Secondary | ICD-10-CM

## 2017-06-10 NOTE — Telephone Encounter (Signed)
[employer] approved PA for novolog 100 unit flexpen. Spoke with Romeo AppleBen from [employer] and he stated that her copy with the PA approve is $250. FYI, pt is coming in for an appt today.

## 2017-06-28 ENCOUNTER — Ambulatory Visit: Payer: Managed Care, Other (non HMO) | Admitting: Endocrinology

## 2017-07-01 ENCOUNTER — Other Ambulatory Visit: Payer: Self-pay | Admitting: Nurse Practitioner

## 2017-07-01 DIAGNOSIS — E138 Other specified diabetes mellitus with unspecified complications: Secondary | ICD-10-CM

## 2017-07-01 DIAGNOSIS — Z794 Long term (current) use of insulin: Principal | ICD-10-CM

## 2017-07-17 ENCOUNTER — Encounter: Payer: Self-pay | Admitting: Nurse Practitioner

## 2017-07-17 ENCOUNTER — Ambulatory Visit (INDEPENDENT_AMBULATORY_CARE_PROVIDER_SITE_OTHER): Payer: Managed Care, Other (non HMO) | Admitting: Nurse Practitioner

## 2017-07-17 DIAGNOSIS — Z9119 Patient's noncompliance with other medical treatment and regimen: Secondary | ICD-10-CM | POA: Diagnosis not present

## 2017-07-17 DIAGNOSIS — E1065 Type 1 diabetes mellitus with hyperglycemia: Secondary | ICD-10-CM | POA: Diagnosis not present

## 2017-07-17 DIAGNOSIS — Z91199 Patient's noncompliance with other medical treatment and regimen due to unspecified reason: Secondary | ICD-10-CM | POA: Insufficient documentation

## 2017-07-17 LAB — GLUCOSE, POCT (MANUAL RESULT ENTRY): POC Glucose: 495 mg/dl — AB (ref 70–99)

## 2017-07-17 MED ORDER — HUMULIN 70/30 KWIKPEN (70-30) 100 UNIT/ML ~~LOC~~ SUPN: 50.0000 [IU] | PEN_INJECTOR | Freq: Two times a day (BID) | SUBCUTANEOUS | 0 refills | Status: DC

## 2017-07-17 MED ORDER — BASAGLAR KWIKPEN 100 UNIT/ML ~~LOC~~ SOPN: 10.0000 [IU] | PEN_INJECTOR | Freq: Every day | SUBCUTANEOUS | 0 refills | Status: DC

## 2017-07-17 NOTE — Patient Instructions (Addendum)
Make appt with endocrinology ASAP.  Check glucose 2-3 times a day and record. Bring glucose readings to next office visit.  I advised her about what an adequate diabetes type 1 management should consist of. I advised her about importance of adequate glucose control with help of an endocrinology. I informed her that I will not be able to continue insulin refills without the involvement of an endocrinologist and home glucose readings.

## 2017-07-17 NOTE — Assessment & Plan Note (Signed)
She is unable to afford Novolog (cost $250 with insurance). Unable to give me reason why she did not get glucometer. Unable to give me reason why she did not make appt with endocrinology.(she canceled 1appt and no show another). I am concerned she has no motivation or intention to follow regimen and/or medication changes that I recommended. I advised her about what an adequate diabetes type 1 management should consist of. I advised her about importance of adequate glucose control with help of an endocrinology. I informed her that I will not be able to continue insulin refills without the involvement of an endocrinologist and home glucose readings.
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
## 2017-07-19 ENCOUNTER — Encounter: Payer: Self-pay | Admitting: Nurse Practitioner

## 2017-07-24 ENCOUNTER — Ambulatory Visit: Payer: Managed Care, Other (non HMO) | Admitting: Nurse Practitioner

## 2017-07-24 ENCOUNTER — Encounter: Payer: Self-pay | Admitting: Nurse Practitioner

## 2017-07-24 VITALS — BP 146/90 | HR 85 | Temp 97.5°F | Ht 62.0 in | Wt 213.0 lb

## 2017-07-24 DIAGNOSIS — E782 Mixed hyperlipidemia: Secondary | ICD-10-CM

## 2017-07-24 DIAGNOSIS — E1065 Type 1 diabetes mellitus with hyperglycemia: Secondary | ICD-10-CM | POA: Diagnosis not present

## 2017-07-24 MED ORDER — PRAVASTATIN SODIUM 20 MG PO TABS: 20.0000 mg | ORAL_TABLET | Freq: Every day | ORAL | 2 refills | Status: DC

## 2017-07-24 MED ORDER — INSULIN ASPART 100 UNIT/ML ~~LOC~~ SOLN: SUBCUTANEOUS | 3 refills | Status: DC

## 2017-07-24 MED ORDER — "INSULIN SYRINGE 30G X 1/2"" 1 ML MISC": 1.0000 [IU] | Freq: Three times a day (TID) | 5 refills | Status: DC

## 2017-07-24 MED ORDER — INSULIN ASPART 100 UNIT/ML ~~LOC~~ SOLN: SUBCUTANEOUS | 11 refills | Status: DC

## 2017-07-24 NOTE — Patient Instructions (Addendum)
Schedule appt with [place] endocr[facility]ology ASAP: [phone].  You have to follow up with endocrinology in order to get additional insulin refills  [employer] (pharmaceutical) about medication assistance.  Stop Humulin  Start Novolog sliding scale Insulin  Need to check glucose 4times a day.(before meals and at bedtime).  Start lantus at bedtime as prescribed.  Insulin Aspart injection What is this medicine? INSULIN ASPART (IN su lin AS part) is a human-made form of insulin. This drug lowers the amount of sugar in your blood. It is a fast acting insulin that starts working faster than regular insulin. It will not work as long as regular insulin. This medicine may be used for other purposes; ask your health care provider or pharmacist if you have questions. COMMON BRAND NAME(S): Fiasp, [employer], NovoLog, NovoLog Flexpen, NovoLog PenFill What should I tell my health care provider before I take this medicine? They need to know if you have any of these conditions: -episodes of low blood sugar -kidney disease -liver disease -an unusual or allergic reaction to insulin, metacresol, other medicines, foods, dyes, or preservatives -pregnant or trying to get pregnant -breast-feeding How should I use this medicine? This medicine is for injection under the skin. Use exactly as directed. It is important to follow the directions given to you by your health care professional or doctor. If you are using Novolog, you should start your meal within 5 to 10 minutes after injection. If you are using Fiasp, you should start your meal at the time of injection or within 20 minutes after injection. Have food ready before injection. Do not delay eating. You will be taught how to use this medicine and how to adjust doses for activities and illness. Do not use more insulin than prescribed. Do not use more or less often than prescribed. Always check the appearance of your insulin before using it.  This medicine should be clear and colorless like water. Do not use if it is cloudy, thickened, colored, or has solid particles in it. It is important that you put your used needles and syringes in a special sharps container. Do not put them in a trash can. If you do not have a sharps container, call your pharmacist or healthcare provider to get one. Talk to your pediatrician regarding the use of this medicine in children. While Novolog may be prescribed for children as young as 17 years of age for selected conditions, precautions do apply. [employer] is not approved for use in children. Overdosage: If you think you have taken too much of this medicine contact a poison control center or emergency room at once. NOTE: This medicine is only for you. Do not share this medicine with others. What if I miss a dose? It is important not to miss a dose. Your health care professional or doctor should discuss a plan for missed doses with you. If you do miss a dose, follow their plan. Do not take double doses. What may interact with this medicine? -other medicines for diabetes Many medications may cause an increase or decrease in blood sugar, these include: -alcohol containing beverages -antiviral medicines for HIV or AIDS -aspirin and aspirin-like drugs -certain medicines for depression, anxiety, or psychotic disturbances -chromium -diuretics -female hormones, like estrogens or progestins and birth control pills -heart medicines -isoniazid -MAOIs like Carbex, Eldepryl, Marplan, Nardil, and Parnate -female hormones or anabolic steroids -medicines for weight loss -medicines for allergies, asthma, cold, or cough -niacin -NSAIDs, medicines for pain and inflammation, like ibuprofen  or naproxen -octreotide -pentamidine -phenytoin -probenecid -quinolone antibiotics like ciprofloxacin, levofloxacin, ofloxacin -some herbal dietary supplements -steroid medicines like prednisone or cortisone -sulfamethoxazole;  trimethoprim -thyroid medicine Some medications can hide the warning symptoms of low blood sugar. You may need to monitor your blood sugar more closely if you are taking one of these medications. These include: -beta-blockers such as atenolol, metoprolol, propranolol -clonidine -guanethidine -reserpine This list may not describe all possible interactions. Give your health care provider a list of all the medicines, herbs, non-prescription drugs, or dietary supplements you use. Also tell them if you smoke, drink alcohol, or use illegal drugs. Some items may interact with your medicine. What should I watch for while using this medicine? Visit your health care professional or doctor for regular checks on your progress. A test called the HbA1C (A1C) will be monitored. This is a simple blood test. It measures your blood sugar control over the last 2 to 3 months. You will receive this test every 3 to 6 months. Learn how to check your blood sugar. Learn the symptoms of low and high blood sugar and how to manage them. Always carry a quick-source of sugar with you in case you have symptoms of low blood sugar. Examples include hard sugar candy or glucose tablets. Make sure others know that you can choke if you eat or drink when you develop serious symptoms of low blood sugar, such as seizures or unconsciousness. They must get medical help at once. Tell your doctor or health care professional if you have high blood sugar. You might need to change the dose of your medicine. If you are sick or exercising more than usual, you might need to change the dose of your medicine. Do not skip meals. Ask your doctor or health care professional if you should avoid alcohol. Many nonprescription cough and cold products contain sugar or alcohol. These can affect blood sugar. Make sure that you have the right kind of syringe for the type of insulin you use. Try not to change the brand and type of insulin or syringe unless your  health care professional or doctor tells you to. Switching insulin brand or type can cause dangerously high or low blood sugar. Always keep an extra supply of insulin, syringes, and needles on hand. Use a syringe one time only. Throw away syringe and needle in a closed container to prevent accidental needle sticks. Insulin pens and cartridges should never be shared. Even if the needle is changed, sharing may result in passing of viruses like hepatitis or HIV. Wear a medical ID bracelet or chain, and carry a card that describes your disease and details of your medicine and dosage times. What side effects may I notice from receiving this medicine? Side effects that you should report to your doctor or health care professional as soon as possible: -allergic reactions like skin rash, itching or hives, swelling of the face, lips, or tongue -breathing problems -signs and symptoms of high blood sugar such as dizziness, dry mouth, dry skin, fruity breath, nausea, stomach pain, increased hunger or thirst, increased urination -signs and symptoms of low blood sugar such as feeling anxious, confusion, dizziness, increased hunger, unusually weak or tired, sweating, shakiness, cold, irritable, headache, blurred vision, fast heartbeat, loss of consciousness Side effects that usually do not require medical attention (report to your doctor or health care professional if they continue or are bothersome): -increase or decrease in fatty tissue under the skin due to overuse of a particular injection site -itching,  burning, swelling, or rash at site where injected This list may not describe all possible side effects. Call your doctor for medical advice about side effects. You may report side effects to FDA at 1-800-FDA-1088. Where should I keep my medicine? Keep out of the reach of children. Store unopened insulin vials in a refrigerator between 2 and 8 degrees C (36 and 46 degrees F). Do not freeze or use if the insulin  has been frozen. Opened vials (vials currently in use) may be stored in the refrigerator or at room temperature, at approximately 30 degrees C (86 degrees F) or cooler. Keeping your insulin at room temperature decreases the amount of pain during injection. Once opened, your insulin can be used for 28 days. After 28 days, the vial of insulin should be thrown away. Store unopened cartridges, Novolog FlexPens,or [employer] in a refrigerator between 2 and 8 degrees C (36 and 46 degrees F.) Do not freeze or use if the insulin has been frozen. Once opened, the Novalog FlexPen and cartridges that are inserted into pens should be kept at room temperature, approximately 25 degrees C (77 degrees F) or cooler for up to 28 days; do not store in the refrigerator. The opened [employer] can be stored at room temperature or refrigerated for up to 28 days. After 28 days, any unused insulin in any of these opened products should be thrown away. Protect from light and excessive heat. Throw away any unused medicine after the expiration date or after the specified time for room temperature storage has passed. NOTE: This sheet is a summary. It may not cover all possible information. If you have questions about this medicine, talk to your doctor, pharmacist, or health care provider.  2018 Elsevier/Gold Standard (2016-02-13 13:55:03)   Hypoglycemia Hypoglycemia is when the sugar (glucose) level in the blood is too low. Symptoms of low blood sugar may include:  Feeling: ? Hungry. ? Worried or nervous (anxious). ? Sweaty and clammy. ? Confused. ? Dizzy. ? Sleepy. ? Sick to your stomach (nauseous).  Having: ? A fast heartbeat. ? A headache. ? A change in your vision. ? Jerky movements that you cannot control (seizure). ? Nightmares. ? Tingling or no feeling (numbness) around the mouth, lips, or tongue.  Having trouble with: ? Talking. ? Paying attention (concentrating). ? Moving  (coordination). ? Sleeping.  Shaking.  Passing out (fainting).  Getting upset easily (irritability).  Low blood sugar can happen to people who have diabetes and people who do not have diabetes. Low blood sugar can happen quickly, and it can be an emergency. Treating Low Blood Sugar Low blood sugar is often treated by eating or drinking something sugary right away. If you can think clearly and swallow safely, follow the 15:15 rule:  Take 15 grams of a fast-acting carb (carbohydrate). Some fast-acting carbs are: ? 1 tube of glucose gel. ? 3 sugar tablets (glucose pills). ? 6-8 pieces of hard candy. ? 4 oz (120 mL) of fruit juice. ? 4 oz (120 mL) of regular (not diet) soda.  Check your blood sugar 15 minutes after you take the carb.  If your blood sugar is still at or below 70 mg/dL (3.9 mmol/L), take 15 grams of a carb again.  If your blood sugar does not go above 70 mg/dL (3.9 mmol/L) after 3 tries, get help right away.  After your blood sugar goes back to normal, eat a meal or a snack within 1 hour.  Treating Very Low  Blood Sugar If your blood sugar is at or below 54 mg/dL (3 mmol/L), you have very low blood sugar (severe hypoglycemia). This is an emergency. Do not wait to see if the symptoms will go away. Get medical help right away. Call your local emergency services (911 in the [place] Do not drive yourself to the hospital. If you have very low blood sugar and you cannot eat or drink, you may need a glucagon shot (injection). A family member or friend should learn how to check your blood sugar and how to give you a glucagon shot. Ask your doctor if you need to have a glucagon shot kit at home. Follow these instructions at home: General instructions  Avoid any diets that cause you to not eat enough food. Talk with your doctor before you start any new diet.  Take over-the-counter and prescription medicines only as told by your doctor.  Limit alcohol to no more than 1 drink per  day for nonpregnant women and 2 drinks per day for men. One drink equals 12 oz of beer, 5 oz of wine, or 1 oz of hard liquor.  Keep all follow-up visits as told by your doctor. This is important. If You Have Diabetes:   Make sure you know the symptoms of low blood sugar.  Always keep a source of sugar with you, such as: ? Sugar. ? Sugar tablets. ? Glucose gel. ? Fruit juice. ? Regular soda (not diet soda). ? Milk. ? Hard candy. ? Honey.  Take your medicines as told.  Follow your exercise and meal plan. ? Eat on time. Do not skip meals. ? Follow your sick day plan when you cannot eat or drink normally. Make this plan ahead of time with your doctor.  Check your blood sugar as often as told by your doctor. Always check before and after exercise.  Share your diabetes care plan with: ? Your work or school. ? People you live with.  Check your pee (urine) for ketones: ? When you are sick. ? As told by your doctor.  Carry a card or wear jewelry that says you have diabetes. If You Have Low Blood Sugar From Other Causes:   Check your blood sugar as often as told by your doctor.  Follow instructions from your doctor about what you cannot eat or drink. Contact a doctor if:  You have trouble keeping your blood sugar in your target range.  You have low blood sugar often. Get help right away if:  You still have symptoms after you eat or drink something sugary.  Your blood sugar is at or below 54 mg/dL (3 mmol/L).  You have jerky movements that you cannot control.  You pass out. These symptoms may be an emergency. Do not wait to see if the symptoms will go away. Get medical help right away. Call your local emergency services (911 in the [place] Do not drive yourself to the hospital. This information is not intended to replace advice given to you by your health care provider. Make sure you discuss any questions you have with your health care provider. Document Released:  07/11/2009 Document Revised: 09/22/2015 Document Reviewed: 05/20/2015 Elsevier Interactive Patient Education  [employer].
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
## 2017-07-24 NOTE — Assessment & Plan Note (Signed)
Advised her to make appt with endocrinology ASAP and further insulin refills will be provided by endocrinology. Stop humulin. Start Novolog SSI, vial sent due to high cost of flexpen. Start [facility] doctorBasaglar. Check glucose as directed. Reminded her to possible complications due to uncontrolled diabetes. Reminded her about importance of regimen compliance and lifestyle changes.

## 2017-09-06 ENCOUNTER — Telehealth: Payer: Self-pay | Admitting: Nurse Practitioner

## 2017-09-06 NOTE — Telephone Encounter (Signed)
Left vm for the pt to call back, need more information. Need to remind her of appt with Endo 10/25/17.      Copied from CRM [id number]. Topic: General - Other >> Sep 06, 2017  9:26 AM Gerrianne Scale wrote: Reason for CRM: patient states that she has an appt at endocrinologist office in August and that Nche took her off of the Novalog and that she want have anything until that appt in August  she also states that her insurance want pay for the [facility]

## 2017-09-09 NOTE — Telephone Encounter (Signed)
Left another vm for the pt to call back.  

## 2017-09-12 ENCOUNTER — Encounter (HOSPITAL_COMMUNITY): Payer: Self-pay | Admitting: Emergency Medicine

## 2017-09-12 ENCOUNTER — Other Ambulatory Visit: Payer: Self-pay

## 2017-09-12 ENCOUNTER — Telehealth: Payer: Self-pay

## 2017-09-12 ENCOUNTER — Encounter: Payer: Self-pay | Admitting: Nurse Practitioner

## 2017-09-12 ENCOUNTER — Ambulatory Visit: Payer: Managed Care, Other (non HMO) | Admitting: Nurse Practitioner

## 2017-09-12 ENCOUNTER — Emergency Department (HOSPITAL_COMMUNITY)
Admission: EM | Admit: 2017-09-12 | Discharge: 2017-09-13 | Disposition: A | Discharge status: Home / Self Care | Payer: Managed Care, Other (non HMO) | Attending: Emergency Medicine | Admitting: Emergency Medicine

## 2017-09-12 ENCOUNTER — Ambulatory Visit: Payer: Self-pay | Admitting: *Deleted

## 2017-09-12 VITALS — BP 142/80 | HR 108 | Wt 208.2 lb

## 2017-09-12 DIAGNOSIS — I10 Essential (primary) hypertension: Secondary | ICD-10-CM

## 2017-09-12 DIAGNOSIS — R350 Frequency of micturition: Secondary | ICD-10-CM

## 2017-09-12 DIAGNOSIS — R000 Tachycardia, unspecified: Secondary | ICD-10-CM | POA: Diagnosis not present

## 2017-09-12 DIAGNOSIS — R5383 Other fatigue: Secondary | ICD-10-CM

## 2017-09-12 DIAGNOSIS — R197 Diarrhea, unspecified: Secondary | ICD-10-CM | POA: Insufficient documentation

## 2017-09-12 DIAGNOSIS — Z9119 Patient's noncompliance with other medical treatment and regimen: Secondary | ICD-10-CM

## 2017-09-12 DIAGNOSIS — E1065 Type 1 diabetes mellitus with hyperglycemia: Secondary | ICD-10-CM | POA: Diagnosis not present

## 2017-09-12 DIAGNOSIS — E1165 Type 2 diabetes mellitus with hyperglycemia: Secondary | ICD-10-CM | POA: Insufficient documentation

## 2017-09-12 DIAGNOSIS — Z794 Long term (current) use of insulin: Secondary | ICD-10-CM | POA: Insufficient documentation

## 2017-09-12 DIAGNOSIS — R110 Nausea: Secondary | ICD-10-CM | POA: Insufficient documentation

## 2017-09-12 DIAGNOSIS — R739 Hyperglycemia, unspecified: Secondary | ICD-10-CM

## 2017-09-12 DIAGNOSIS — Z91199 Patient's noncompliance with other medical treatment and regimen due to unspecified reason: Secondary | ICD-10-CM

## 2017-09-12 LAB — CBC
HCT: 39.5 % (ref 36.0–46.0)
Hemoglobin: 13.4 g/dL (ref 12.0–15.0)
MCH: 29.6 pg (ref 26.0–34.0)
MCHC: 33.9 g/dL (ref 30.0–36.0)
MCV: 87.2 fL (ref 78.0–100.0)
PLATELETS: 279 10*3/uL (ref 150–400)
RBC: 4.53 MIL/uL (ref 3.87–5.11)
RDW: 13 % (ref 11.5–15.5)
WBC: 14.6 10*3/uL — AB (ref 4.0–10.5)

## 2017-09-12 LAB — BASIC METABOLIC PANEL
ANION GAP: 8 (ref 5–15)
BUN: 12 mg/dL (ref 6–23)
BUN: 11 mg/dL (ref 6–20)
CALCIUM: 8.6 mg/dL — AB (ref 8.9–10.3)
CHLORIDE: 98 mmol/L — AB (ref 101–111)
CO2: 24 mEq/L (ref 19–32)
CO2: 21 mmol/L — AB (ref 22–32)
CREATININE: 0.84 mg/dL (ref 0.44–1.00)
Calcium: 8.7 mg/dL (ref 8.4–10.5)
Chloride: 97 mEq/L (ref 96–112)
Creatinine, Ser: 0.87 mg/dL (ref 0.40–1.20)
GFR calc non Af Amer: >60 mL/min (ref >60)
GFR: 94.44 mL/min (ref >60.00)
Glucose, Bld: 521 mg/dL (ref 70–99)
Glucose, Bld: 470 mg/dL — ABNORMAL HIGH (ref 65–99)
POTASSIUM: 4.1 meq/L (ref 3.5–5.1)
Potassium: 4.3 mmol/L (ref 3.5–5.1)
SODIUM: 128 meq/L — AB (ref 135–145)
Sodium: 127 mmol/L — ABNORMAL LOW (ref 135–145)

## 2017-09-12 LAB — POCT UA - MICROALBUMIN
CREATININE, POC: 50 mg/dL
Microalbumin Ur, POC: 150 mg/L

## 2017-09-12 LAB — POCT URINALYSIS DIPSTICK
Bilirubin, UA: NEGATIVE
LEUKOCYTES UA: NEGATIVE
NITRITE UA: NEGATIVE
PH UA: 6 (ref 5.0–8.0)
RBC UA: NEGATIVE
SPEC GRAV UA: 1.015 (ref 1.010–1.025)
Urobilinogen, UA: 0.2 E.U./dL

## 2017-09-12 LAB — POCT URINE PREGNANCY: Preg Test, Ur: NEGATIVE

## 2017-09-12 LAB — CBG MONITORING, ED
GLUCOSE-CAPILLARY: 452 mg/dL — AB (ref 65–99)
GLUCOSE-CAPILLARY: 381 mg/dL — AB (ref 65–99)

## 2017-09-12 LAB — I-STAT BETA HCG BLOOD, ED (MC, WL, AP ONLY)

## 2017-09-12 LAB — GLUCOSE, POCT (MANUAL RESULT ENTRY): POC GLUCOSE: 496 mg/dL — AB (ref 70–99)

## 2017-09-12 MED ORDER — LISINOPRIL 5 MG PO TABS: 5.0000 mg | ORAL_TABLET | Freq: Every day | ORAL | 3 refills | Status: DC

## 2017-09-12 MED ORDER — SODIUM CHLORIDE 0.9 % IV BOLUS
1000.0000 mL | Freq: Once | INTRAVENOUS | Status: AC
  Administered 2017-09-13: 1000 mL via INTRAVENOUS

## 2017-09-12 NOTE — Telephone Encounter (Signed)
FYI, appt set for today.

## 2017-09-12 NOTE — ED Provider Notes (Signed)
[facility] DEPT Provider Note   CSN: [id number] Arrival date & time: 09/12/17  2003     History   Chief Complaint No chief complaint on file.   HPI Lisa Werner is a 37 y.o. female with a hx of IDDM (diagnosed in 2008) presents to the Emergency Department complaining of gradual, persistent, progressively worsening hyperglycemia.  Pt reports she has been feeling malaise and fatigue onset 3 days ago.  Pt reports her blood sugars have been running between 200-mid 300's.  She reports it is normal for her CBG to be in the 200s.  Pt reports she is checking her CBG every other day.  Pt Associated symptoms include intermittent nausea.  Pt reports she intermittently has "diarrhea" consisting of 1 loose stool without melena or hematochezia. She reports her last episode of loose stool was today.   She denies vomiting.  Pt reports she has had intense episodes of feeling poorly, but she has not checked her blood sugar during these episodes. Pt reports she was previously taking Humalin and has been transitioning to Novolog.  Pt reports her first dose of the Novolog was on Friday (6 days ago).  Nothing makes it better and nothing makes it worse.  Pt denies fever, chills, headache, neck pain, chest pain, SOB, abd pain, vomiting, syncope, dysuria.    Patient's record was reviewed.  She was seen earlier today at her primary care provider.  At previous visit she had been prescribed Humalog and Basaglar.  For some unknown reason, she only picked up her Novalog.  At that visit, she endorsed weakness, fatigue, urinary frequency and urinary urgency.  Patient was instructed to pick up her Basaglar from the pharmacy.   Pt reports she was sent here from her PCP due to her elevated CBG.    The history is provided by the patient and medical records. No language interpreter was used.    Past Medical History:  Diagnosis Date  . Diabetes mellitus without complication [facility])     Patient  Active Problem List   Diagnosis Date Noted  . Noncompliance with diabetes treatment 07/17/2017  . Mixed hyperlipidemia 06/03/2017  . Pain of left eye 06/03/2017  . Urinary frequency 06/03/2017  . DM (diabetes mellitus) ([place]) 09/28/2013    History reviewed. No pertinent surgical history.   OB History   None      Home Medications    Prior to Admission medications   Medication Sig Start Date End Date Taking? Authorizing Provider  insulin aspart (NOVOLOG) 100 UNIT/ML injection Insulin (Novolog) sliding scale for additional Novolog: administer 20mnutes before meals. Sugar <199 - no additional units Sugar >200 - 250 4 additional units Sugar >251-300 - 6 additional units Sugar >300-350 - 8 additional units Sugar >351 - 10 additional units and call office. 07/24/17  Yes Nche, CCharlene Brooke NP  pravastatin (PRAVACHOL) 20 MG tablet Take 1 tablet (20 mg total) by mouth daily. 07/24/17  Yes Nche, CCharlene Brooke NP  blood glucose meter kit and supplies KIT Dispense based on patient and insurance preference. Use three times a day (before meals). IHERD40.82/1/19   Nche, CCharlene Brooke NP  Insulin Glargine (BASAGLAR KWIKPEN) 100 UNIT/ML SOPN Inject 0.1 mLs (10 Units total) into the skin at bedtime. 07/17/17   Nche, CCharlene Brooke NP  Insulin Pen Needle (PEN NEEDLES) 31G X 6 MM MISC 1 application by Does not apply route 3 (three) times daily - between meals and at bedtime. 06/04/17   Nche, CCharlene Brooke  NP  Insulin Syringe-Needle U-100 (INSULIN SYRINGE 1CC/30GX1/2") 30G X 1/2" 1 ML MISC 1 Units by Does not apply route 4 (four) times daily -  before meals and at bedtime. 07/24/17   Nche, Charlene Brooke, NP  lisinopril (PRINIVIL,ZESTRIL) 5 MG tablet Take 1 tablet (5 mg total) by mouth daily. 09/12/17   Nche, Charlene Brooke, NP    Family History Family History  Problem Relation Age of Onset  . Arthritis Mother   . Kidney disease Mother   . Asthma Father     Social History Social History   Tobacco  Use  . Smoking status: Never Smoker  . Smokeless tobacco: Never Used  Substance Use Topics  . Alcohol use: No  . Drug use: No     Allergies   Patient has no known allergies.   Review of Systems Review of Systems  Constitutional: Positive for fatigue. Negative for appetite change, diaphoresis, fever and unexpected weight change.  HENT: Negative for mouth sores.   Eyes: Negative for visual disturbance.  Respiratory: Negative for cough, chest tightness, shortness of breath and wheezing.   Cardiovascular: Negative for chest pain.  Gastrointestinal: Positive for diarrhea and nausea. Negative for abdominal pain, constipation and vomiting.  Endocrine: Positive for polydipsia and polyuria. Negative for polyphagia.  Genitourinary: Positive for frequency and urgency. Negative for dysuria and hematuria.  Musculoskeletal: Negative for back pain and neck stiffness.  Skin: Negative for rash.  Allergic/Immunologic: Negative for immunocompromised state.  Neurological: Negative for syncope, light-headedness and headaches.  Hematological: Does not bruise/bleed easily.  Psychiatric/Behavioral: Negative for sleep disturbance. The patient is not nervous/anxious.      Physical Exam Updated Vital Signs BP (!) 120/93 (BP Location: Left Arm)   Pulse (!) 101   Temp 99.1 F (37.3 C) (Oral)   Resp 16   SpO2 97%   Physical Exam  Constitutional: She appears well-developed and well-nourished. No distress.  Awake, alert, nontoxic appearance  HENT:  Head: Normocephalic and atraumatic.  Mouth/Throat: Oropharynx is clear and moist. No oropharyngeal exudate.  Eyes: Conjunctivae are normal. No scleral icterus.  Neck: Normal range of motion. Neck supple.  Cardiovascular: Regular rhythm and intact distal pulses. Tachycardia present.  Pulses:      Radial pulses are 2+ on the right side, and 2+ on the left side.       Dorsalis pedis pulses are 2+ on the right side, and 2+ on the left side.    Pulmonary/Chest: Effort normal and breath sounds normal. No respiratory distress. She has no wheezes.  Equal chest expansion  Abdominal: Soft. Bowel sounds are normal. She exhibits no mass. There is no tenderness. There is no rebound and no guarding.  Musculoskeletal: Normal range of motion. She exhibits no edema.  Neurological: She is alert.  Speech is clear and goal oriented Moves extremities without ataxia  Skin: Skin is warm and dry. She is not diaphoretic.  Psychiatric: She has a normal mood and affect.  Nursing note and vitals reviewed.    ED Treatments / Results  Labs (all labs ordered are listed, but only abnormal results are displayed) Labs Reviewed  BASIC METABOLIC PANEL - Abnormal; Notable for the following components:      Result Value   Sodium 127 (*)    Chloride 98 (*)    CO2 21 (*)    Glucose, Bld 470 (*)    Calcium 8.6 (*)    All other components within normal limits  CBC - Abnormal; Notable for the following components:  WBC 14.6 (*)    All other components within normal limits  URINALYSIS, ROUTINE W REFLEX MICROSCOPIC - Abnormal; Notable for the following components:   Color, Urine STRAW (*)    Glucose, UA >=500 (*)    Ketones, ur 5 (*)    Protein, ur 30 (*)    All other components within normal limits  CBG MONITORING, ED - Abnormal; Notable for the following components:   Glucose-Capillary 452 (*)    All other components within normal limits  CBG MONITORING, ED - Abnormal; Notable for the following components:   Glucose-Capillary 381 (*)    All other components within normal limits  CBG MONITORING, ED - Abnormal; Notable for the following components:   Glucose-Capillary 288 (*)    All other components within normal limits  I-STAT BETA HCG BLOOD, ED ([facility], [facility], [facility] ONLY)     Procedures Procedures (including critical care time)  Medications Ordered in ED Medications  sodium chloride 0.9 % bolus 1,000 mL (0 mLs Intravenous Stopped 09/13/17 0142)   sodium chloride 0.9 % bolus 1,000 mL (1,000 mLs Intravenous New Bag/Given 09/13/17 0100)     Initial Impression / Assessment and Plan / ED Course  I have reviewed the triage vital signs and the nursing notes.  Pertinent labs & imaging results that were available during my care of the patient were reviewed by me and considered in my medical decision making (see chart for details).  Clinical Course as of Sep 13 244  Fri Sep 13, 2017  0144 Patient's tachycardia has improved.  She reports she is feeling some better.   [HM]  8110 Patient's blood sugar has improved to under 300.  Glucose-Capillary(!): 288 [HM]  [id number] Patient is no longer tachycardic.  Pulse Rate: 79 [HM]  0244 Normal anion gap  Anion gap: 8 [HM]  0244 Leukocytosis noted however this is not far from patient baseline.  WBC(!): 14.6 [HM]  0245 Sodium 136 when corrected for hyperglycemia.  Sodium(!): 127 [HM]    Clinical Course User Index [HM] November Sypher, Jarrett Soho, [name]    Patient presents with hyperglycemia from her primary care clinic.  She has been taking her long-acting insulin but has not been taking any short acting insulin.  On arrival, her blood sugar is greater than 500.  She does have a few ketones in her urine however she has a normal anion gap.  Doubt DKA.  Patient is well-appearing.  Mild tachycardia on arrival however this has improved with fluids.  Patient states she is feeling significantly better after her fluids.  Patient did see her primary care today and was instructed to fill her short acting insulin.  She states understanding.  I discussed reasons to return immediately to the emergency department.  Patient states understanding and is in agreement with the plan.  Final Clinical Impressions(s) / ED Diagnoses   Final diagnoses:  Hyperglycemia    ED Discharge Orders    None       Teea Ducey, Gwenlyn Perking 09/13/17 [place], [place], DO 09/13/17 [phone]

## 2017-09-12 NOTE — Assessment & Plan Note (Addendum)
Uncontrolled. I think her symptoms are due to hyperglycemia. Did not start basaglar as prescribed.  She is not following sliding scale as prescribed. She is unable to tell me how much insulin she took today. Does not check glucose daily. She has canceled and rescheduled her appt with endocrinology 06/28/2017 to 10/25/2017. Glucose reading provided today: Home glucose reading: Friday 5/10 AM 247, PM 262 Sunday 5/12 AM 256, PM 249 Monday 5/13 AM 270 PM 241 Wednesday 5/15 AM 248 PM 253.  Counseled patient about the importance of medication compliance and possible complications from uncontrolled diabetes (renal failure, loss of vision, ketoacidosis).  With current symptoms, elevated glucose and abnormal urinalysis (positve ketones): advised patient to go to ED for possible ketoacidosis protocol.

## 2017-09-12 NOTE — Patient Instructions (Addendum)
With elevated glucose, positive ketones, fatigue, dizziness, and palpitation: advised patient to go to ED for possible treatment of ketoacidosis. She agreed to go to [facility] ED today.  Hyperglycemia Hyperglycemia occurs when the level of sugar (glucose) in the blood is too high. Glucose is a type of sugar that provides the body's main source of energy. Certain hormones (insulin and glucagon) control the level of glucose in the blood. Insulin lowers blood glucose, and glucagon increases blood glucose. Hyperglycemia can result from having too little insulin in the bloodstream, or from the body not responding normally to insulin. Hyperglycemia occurs most often in people who have diabetes (diabetes mellitus), but it can happen in people who do not have diabetes. It can develop quickly, and it can be life-threatening if it causes you to become severely dehydrated (diabetic ketoacidosis or hyperglycemic hyperosmolar state). Severe hyperglycemia is a medical emergency. What are the causes? If you have diabetes, hyperglycemia may be caused by:  Diabetes medicine.  Medicines that increase blood glucose or affect your diabetes control.  Not eating enough, or not eating often enough.  Changes in physical activity level.  Being sick or having an infection.  If you have prediabetes or undiagnosed diabetes:  Hyperglycemia may be caused by those conditions.  If you do not have diabetes, hyperglycemia may be caused by:  Certain medicines, including steroid medicines, beta-blockers, epinephrine, and thiazide diuretics.  Stress.  Serious illness.  Surgery.  Diseases of the pancreas.  Infection.  What increases the risk? Hyperglycemia is more likely to develop in people who have risk factors for diabetes, such as:  Having a family member with diabetes.  Having a gene for type 1 diabetes that is passed from parent to child (inherited).  Living in an area with cold weather conditions.  Exposure  to certain viruses.  Certain conditions in which the body's disease-fighting (immune) system attacks itself (autoimmune disorders).  Being overweight or obese.  Having an inactive (sedentary) lifestyle.  Having been diagnosed with insulin resistance.  Having a history of prediabetes, gestational diabetes, or polycystic ovarian syndrome (PCOS).  Being of American-Indian, African-American, Hispanic/Latino, or Asian/Pacific Islander descent.  What are the signs or symptoms? Hyperglycemia may not cause any symptoms. If you do have symptoms, they may include early warning signs, such as:  Increased thirst.  Hunger.  Feeling very tired.  Needing to urinate more often than usual.  Blurry vision.  Other symptoms may develop if hyperglycemia gets worse, such as:  Dry mouth.  Loss of appetite.  Fruity-smelling breath.  Weakness.  Unexpected or rapid weight gain or weight loss.  Tingling or numbness in the hands or feet.  Headache.  Skin that does not quickly return to normal after being lightly pinched and released (poor skin turgor).  Abdominal pain.  Cuts or bruises that are slow to heal.  How is this diagnosed? Hyperglycemia is diagnosed with a blood test to measure your blood glucose level. This blood test is usually done while you are having symptoms. Your health care provider may also do a physical exam and review your medical history. You may have more tests to determine the cause of your hyperglycemia, such as:  A fasting blood glucose (FBG) test. You will not be allowed to eat (you will fast) for at least 8 hours before a blood sample is taken.  An A1c (hemoglobin A1c) blood test. This provides information about blood glucose control over the previous 2-3 months.  An oral glucose tolerance test (OGTT). This measures  your blood glucose at two times: ? After fasting. This is your baseline blood glucose level. ? Two hours after drinking a beverage that contains  glucose.  How is this treated? Treatment depends on the cause of your hyperglycemia. Treatment may include:  Taking medicine to regulate your blood glucose levels. If you take insulin or other diabetes medicines, your medicine or dosage may be adjusted.  Lifestyle changes, such as exercising more, eating healthier foods, or losing weight.  Treating an illness or infection, if this caused your hyperglycemia.  Checking your blood glucose more often.  Stopping or reducing steroid medicines, if these caused your hyperglycemia.  If your hyperglycemia becomes severe and it results in hyperglycemic hyperosmolar state, you must be hospitalized and given IV fluids. Follow these instructions at home: General instructions  Take over-the-counter and prescription medicines only as told by your health care provider.  Do not use any products that contain nicotine or tobacco, such as cigarettes and e-cigarettes. If you need help quitting, ask your health care provider.  Limit alcohol intake to no more than 1 drink per day for nonpregnant women and 2 drinks per day for men. One drink equals 12 oz of beer, 5 oz of wine, or 1 oz of hard liquor.  Learn to manage stress. If you need help with this, ask your health care provider.  Keep all follow-up visits as told by your health care provider. This is important. Eating and drinking  Maintain a healthy weight.  Exercise regularly, as directed by your health care provider.  Stay hydrated, especially when you exercise, get sick, or spend time in hot temperatures.  Eat healthy foods, such as: ? Lean proteins. ? Complex carbohydrates. ? Fresh fruits and vegetables. ? Low-fat dairy products. ? Healthy fats.  Drink enough fluid to keep your urine clear or pale yellow. If you have diabetes:   Make sure you know the symptoms of hyperglycemia.  Follow your diabetes management plan, as told by your health care provider. Make sure you: ? Take your  insulin and medicines as directed. ? Follow your exercise plan. ? Follow your meal plan. Eat on time, and do not skip meals. ? Check your blood glucose as often as directed. Make sure to check your blood glucose before and after exercise. If you exercise longer or in a different way than usual, check your blood glucose more often. ? Follow your sick day plan whenever you cannot eat or drink normally. Make this plan in advance with your health care provider.  Share your diabetes management plan with people in your workplace, school, and household.  Check your urine for ketones when you are ill and as told by your health care provider.  Carry a medical alert card or wear medical alert jewelry. Contact a health care provider if:  Your blood glucose is at or above 240 mg/dL (16.1 mmol/L) for 2 days in a row.  You have problems keeping your blood glucose in your target range.  You have frequent episodes of hyperglycemia. Get help right away if:  You have difficulty breathing.  You have a change in how you think, feel, or act (mental status).  You have nausea or vomiting that does not go away. These symptoms may represent a serious problem that is an emergency. Do not wait to see if the symptoms will go away. Get medical help right away. Call your local emergency services (911 in the [place] Do not drive yourself to the hospital. Summary  Hyperglycemia  occurs when the level of sugar (glucose) in the blood is too high.  Hyperglycemia is diagnosed with a blood test to measure your blood glucose level. This blood test is usually done while you are having symptoms. Your health care provider may also do a physical exam and review your medical history.  If you have diabetes, follow your diabetes management plan as told by your health care provider.  Contact your health care provider if you have problems keeping your blood glucose in your target range. This information is not intended to replace  advice given to you by your health care provider. Make sure you discuss any questions you have with your health care provider. Document Released: 10/10/2000 Document Revised: 01/02/2016 Document Reviewed: 01/02/2016 Elsevier Interactive Patient Education  [employer].

## 2017-09-12 NOTE — ED Triage Notes (Signed)
Pt from home with c/o hyperglycemia. Pt states she started taking Humalog on Friday and has had blood sugar in 200-300. Pt states she has had intermittent nausea with diarrhea. Last episode was earlier today.

## 2017-09-12 NOTE — Telephone Encounter (Signed)
Patient has started new insulin- she reports heart palpitations and fatigue. Patient reports she switched to nova log-  Patient has been on it 1 week. Patient has been keeping some records of her glucose levels- discussed importance of keeping records- especially with new changes.  Reason for Disposition . [1] Request for URGENT new prescription or refill of "essential" medication (i.e., likelihood of harm to patient if not taken) AND [2] triager unable to fill per unit policy  Answer Assessment - Initial Assessment Questions 1. SYMPTOMS: "Do you have any symptoms?"     Heart palpitations and fatigue.  2. SEVERITY: If symptoms are present, ask "Are they mild, moderate or severe?"     Patient reports she has different sensations allover her body- patient reports her glucose levels have been around- 200-300- she does not think she is seeing a big difference.  Protocols used: MEDICATION QUESTION CALL-A-AH

## 2017-09-12 NOTE — Telephone Encounter (Signed)
Critical lab reported by Si from [employer]. Glucose - 525 I reported result directly to [facility].
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
## 2017-09-13 ENCOUNTER — Encounter: Payer: Self-pay | Admitting: Nurse Practitioner

## 2017-09-13 LAB — CBG MONITORING, ED: Glucose-Capillary: 288 mg/dL — ABNORMAL HIGH (ref 65–99)

## 2017-09-13 LAB — URINALYSIS, ROUTINE W REFLEX MICROSCOPIC
Bacteria, UA: NONE SEEN
Bilirubin Urine: NEGATIVE
Glucose, UA: >=500 mg/dL — AB
Hgb urine dipstick: NEGATIVE
Ketones, ur: 5 mg/dL — AB
Leukocytes, UA: NEGATIVE
Nitrite: NEGATIVE
Protein, ur: 30 mg/dL — AB
Specific Gravity, Urine: 1.029 (ref 1.005–1.030)
pH: 6 (ref 5.0–8.0)

## 2017-09-13 MED ORDER — SODIUM CHLORIDE 0.9 % IV BOLUS
1000.0000 mL | Freq: Once | INTRAVENOUS | Status: AC
Start: 2017-09-13 — End: 2017-09-13
  Administered 2017-09-13: 1000 mL via INTRAVENOUS

## 2017-09-13 NOTE — Discharge Instructions (Addendum)
1. Medications: usual home medications including Basaglar - please fill this medication 2. Treatment: rest, drink plenty of fluids,  3. Follow Up: Please followup with your primary doctor in 3-5 days for discussion of your diagnoses and further evaluation after today's visit; if you do not have a primary care doctor use the resource guide provided to find one; Please return to the ER for vomiting, diarrhea, abdominal pain, worsening high blood sugars or other concerns

## 2017-10-10 LAB — HM DIABETES EYE EXAM

## 2017-10-11 ENCOUNTER — Encounter: Payer: Self-pay | Admitting: Nurse Practitioner

## 2017-10-11 NOTE — Progress Notes (Signed)
Abstracted result and sent to scan  

## 2017-10-25 ENCOUNTER — Ambulatory Visit: Payer: Managed Care, Other (non HMO) | Admitting: Endocrinology

## 2017-10-25 ENCOUNTER — Encounter: Payer: Self-pay | Admitting: Endocrinology

## 2017-10-25 VITALS — BP 116/80 | HR 97 | Wt 208.4 lb

## 2017-10-25 DIAGNOSIS — R350 Frequency of micturition: Secondary | ICD-10-CM

## 2017-10-25 DIAGNOSIS — Z9114 Patient's other noncompliance with medication regimen: Secondary | ICD-10-CM | POA: Diagnosis not present

## 2017-10-25 DIAGNOSIS — E1065 Type 1 diabetes mellitus with hyperglycemia: Secondary | ICD-10-CM | POA: Diagnosis not present

## 2017-10-25 DIAGNOSIS — F329 Major depressive disorder, single episode, unspecified: Secondary | ICD-10-CM

## 2017-10-25 DIAGNOSIS — Z9119 Patient's noncompliance with other medical treatment and regimen: Secondary | ICD-10-CM

## 2017-10-25 LAB — POCT GLYCOSYLATED HEMOGLOBIN (HGB A1C): HEMOGLOBIN A1C: 14 % — AB (ref 4.0–5.6)

## 2017-10-25 MED ORDER — BASAGLAR KWIKPEN 100 UNIT/ML ~~LOC~~ SOPN: 50.0000 [IU] | PEN_INJECTOR | SUBCUTANEOUS | 0 refills | Status: DC

## 2017-10-25 MED ORDER — INSULIN ASPART 100 UNIT/ML ~~LOC~~ SOLN: 4.0000 [IU] | Freq: Three times a day (TID) | SUBCUTANEOUS | 3 refills | Status: DC

## 2017-10-25 NOTE — Patient Instructions (Addendum)
good diet and exercise significantly improve the control of your diabetes.  please let me know if you wish to be referred to a dietician.  high blood sugar is very risky to your health.  you should see an eye doctor and dentist every year.  It is very important to get all recommended vaccinations.  Controlling your blood pressure and cholesterol drastically reduces the damage diabetes does to your body.  Those who smoke should quit.  Please discuss these with your doctor.  check your blood sugar twice a day.  vary the time of day when you check, between before the 3 meals, and at bedtime.  also check if you have symptoms of your blood sugar being too high or too low.  please keep a record of the readings and bring it to your next appointment here (or you can bring the meter itself).  You can write it on any piece of paper.  please call us sooner if your blood sugar goes below 70, or if you have a lot of readings over 200. Please see your PCP, about the depression symptoms. For now, please: Change basaglar to 50 units each morning, and: Take novolog, just 4 units if your blood sugar is over 300. Please call or message us next week, to tell us how the blood sugar is doing.  Please come back for a follow-up appointment in 2 months.

## 2017-10-25 NOTE — Progress Notes (Signed)
Subjective:    Patient ID: Lisa Werner, female    DOB: 21-Oct-1980, 37 y.o.   MRN: [id number]  HPI pt is referred by Alysia Pennaharlotte Nche, NP, for diabetes.  Pt states DM was dx'ed in 2007; she has mild if any neuropathy of the lower extremities; she is unaware of any associated chronic complications; she has been on insulin since dx; pt says hers diet is good, and exercise is fair; she has never had GDM (G0), pancreatitis, pancreatic surgery, severe hypoglycemia or DKA.  She takes lantus, 10 units qhs, and prn novolog (averages 12 units 3 times a day (just before each meal).  she says cbg's are persistently over 200.    Past Medical History:  Diagnosis Date  . Diabetes mellitus without complication (HCC)     History reviewed. No pertinent surgical history.  Social History   Socioeconomic History  . Marital status: Single    Spouse name: Not on file  . Number of children: Not on file  . Years of education: Not on file  . Highest education level: Not on file  Occupational History  . Not on file  Social Needs  . Financial resource strain: Not on file  . Food insecurity:    Worry: Not on file    Inability: Not on file  . Transportation needs:    Medical: Not on file    Non-medical: Not on file  Tobacco Use  . Smoking status: Never Smoker  . Smokeless tobacco: Never Used  Substance and Sexual Activity  . Alcohol use: No  . Drug use: No  . Sexual activity: Never  Lifestyle  . Physical activity:    Days per week: Not on file    Minutes per session: Not on file  . Stress: Not on file  Relationships  . Social connections:    Talks on phone: Not on file    Gets together: Not on file    Attends religious service: Not on file    Active member of club or organization: Not on file    Attends meetings of clubs or organizations: Not on file    Relationship status: Not on file  . Intimate partner violence:    Fear of current or ex partner: Not on file    Emotionally abused: Not  on file    Physically abused: Not on file    Forced sexual activity: Not on file  Other Topics Concern  . Not on file  Social History Narrative  . Not on file    Current Outpatient Medications on File Prior to Visit  Medication Sig Dispense Refill  . Insulin Syringe-Needle U-100 (INSULIN SYRINGE 1CC/30GX1/2") 30G X 1/2" 1 ML MISC 1 Units by Does not apply route 4 (four) times daily -  before meals and at bedtime. 100 each 5  . lisinopril (PRINIVIL,ZESTRIL) 5 MG tablet Take 1 tablet (5 mg total) by mouth daily. 30 tablet 3  . pravastatin (PRAVACHOL) 20 MG tablet Take 1 tablet (20 mg total) by mouth daily. 30 tablet 2   No current facility-administered medications on file prior to visit.     No Known Allergies  Family History  Problem Relation Age of Onset  . Arthritis Mother   . Kidney disease Mother   . Asthma Father   . Diabetes Maternal Grandmother     BP 116/80 (BP Location: Left Arm, Patient Position: Sitting, Cuff Size: Normal)   Pulse 97   Wt 208 lb 6.4 oz (94.5 kg)  SpO2 97%   BMI 38.12 kg/m     Review of Systems denies weight loss, blurry vision, headache, chest pain, sob, n/v, urinary frequency, muscle cramps, excessive diaphoresis, memory loss, depression, cold intolerance, rhinorrhea, and easy bruising.  She has depression, but no SI.       Objective:   Physical Exam VS: see vs page GEN: no distress HEAD: head: no deformity eyes: no periorbital swelling, no proptosis external nose and ears are normal mouth: no lesion seen NECK: supple, thyroid is not enlarged CHEST WALL: no deformity LUNGS: clear to auscultation CV: reg rate and rhythm, no murmur ABD: abdomen is soft, nontender.  no hepatosplenomegaly.  not distended.  no hernia MUSCULOSKELETAL: muscle bulk and strength are grossly normal.  no obvious joint swelling.  gait is normal and steady EXTEMITIES: no deformity.  no ulcer on the feet, but the skin is dry.  feet are of normal color and temp.   no edema PULSES: dorsalis pedis intact bilat.  no carotid bruit NEURO:  cn 2-12 grossly intact.   readily moves all 4's.  sensation is intact to touch on the feet SKIN:  Normal texture and temperature.  No rash or suspicious lesion is visible.   NODES:  None palpable at the neck PSYCH: alert, well-oriented.  Does not appear anxious nor depressed.  I have reviewed outside records, and summarized: Pt was noted to have elevated a1c, and referred here.  Noncompliance with rx was noted  Lab Results  Component Value Date   HGBA1C 14.0 (A) 10/25/2017   Lab Results  Component Value Date   CHOL 236 (H) 05/31/2017   HDL 33.10 (L) 05/31/2017   LDLCALC 181 (H) 05/31/2017   TRIG 113.0 05/31/2017   CHOLHDL 7 05/31/2017       Assessment & Plan:  Insulin-requiring type 2 DM, with polyneuropathy: severe exacerbation Noncompliance with cbg recording and insulin.  She needs a simpler regimen, which is to emphasize the basal Depression: this usually interferes with rx of DM Urinary frequency: this is expected to improve with improved glycemic control  Patient Instructions  good diet and exercise significantly improve the control of your diabetes.  please let me know if you wish to be referred to a dietician.  high blood sugar is very risky to your health.  you should see an eye doctor and dentist every year.  It is very important to get all recommended vaccinations.  Controlling your blood pressure and cholesterol drastically reduces the damage diabetes does to your body.  Those who smoke should quit.  Please discuss these with your doctor.  check your blood sugar twice a day.  vary the time of day when you check, between before the 3 meals, and at bedtime.  also check if you have symptoms of your blood sugar being too high or too low.  please keep a record of the readings and bring it to your next appointment here (or you can bring the meter itself).  You can write it on any piece of paper.  please  call us sooner if your blood sugar goes below 70, or if you have a lot of readings over 200. Please see your PCP, about the depression symptoms. For now, please: Change basaglar to 50 units each morning, and: Take novolog, just 4 units if your blood sugar is over 300. Please call or message [place] next week, to tell us how the blood sugar is doing.  Please come back for a follow-up appointment in 2 months.

## 2017-11-08 NOTE — Progress Notes (Deleted)
Need depression and anxiety form completed

## 2017-11-11 ENCOUNTER — Ambulatory Visit: Payer: Managed Care, Other (non HMO) | Admitting: Nurse Practitioner

## 2018-02-12 ENCOUNTER — Emergency Department (HOSPITAL_COMMUNITY)
Admission: EM | Admit: 2018-02-12 | Discharge: 2018-02-12 | Discharge status: Against Medical Advice | Payer: Managed Care, Other (non HMO) | Attending: Emergency Medicine | Admitting: Emergency Medicine

## 2018-02-12 ENCOUNTER — Encounter (HOSPITAL_COMMUNITY): Payer: Self-pay | Admitting: *Deleted

## 2018-02-12 ENCOUNTER — Other Ambulatory Visit: Payer: Self-pay

## 2018-02-12 DIAGNOSIS — Z5321 Procedure and treatment not carried out due to patient leaving prior to being seen by health care provider: Secondary | ICD-10-CM | POA: Insufficient documentation

## 2018-02-12 DIAGNOSIS — Z760 Encounter for issue of repeat prescription: Secondary | ICD-10-CM | POA: Insufficient documentation

## 2018-02-12 NOTE — ED Notes (Signed)
Pt informed Registration was leaving.

## 2018-02-12 NOTE — ED Triage Notes (Signed)
Pt stated "I lost my job, have no insurance, have been out of insulin for 4-5 months.  I was hoping they could give it to me."

## 2018-05-29 ENCOUNTER — Telehealth: Payer: Self-pay | Admitting: Nurse Practitioner

## 2018-05-29 NOTE — Telephone Encounter (Signed)
PA started for Novolog flex pen. Waiting for response. ---Duwayne Heck can you help follow up on this PA  Earnie Stlouis (Key: [id number])

## 2018-06-04 NOTE — Telephone Encounter (Signed)
Spoke with Rosann Auerbach[employer] Rep, she stated that insurance is not active. PA denied. FYI

## 2018-06-26 ENCOUNTER — Telehealth: Payer: Self-pay

## 2018-06-26 NOTE — Telephone Encounter (Signed)
Copied from CRM [id number]. Topic: General - Other >> Jun 26, 2018 10:27 AM Trula Slade wrote: Reason for CRM:   [place] w/[employer] [phone] Ref #: [id number]  would like to someone who handles the prior authorization for the patient's NOVALOG medication.

## 2018-06-26 NOTE — Telephone Encounter (Signed)
Spoke with Cover [employer] rep, advise her that last time we started the PA it was denied due to pt's insurance is not active.   Attempt to call the pt to get update insurance card but pt hung up on me before I can tell her what Im calling about. Closed this phone note.

## 2018-07-29 ENCOUNTER — Telehealth: Payer: Self-pay

## 2018-07-29 NOTE — Telephone Encounter (Signed)
Called Pt to see about scheduling an OV with [facility] lab for diabetes management. Pt declined "due to not having proper insurance at this time, but thank you" . Call ended.

## 2018-11-19 ENCOUNTER — Telehealth: Payer: Self-pay | Admitting: Licensed Clinical Social Worker

## 2018-11-19 ENCOUNTER — Ambulatory Visit (INDEPENDENT_AMBULATORY_CARE_PROVIDER_SITE_OTHER): Payer: Self-pay | Admitting: Internal Medicine

## 2018-11-19 ENCOUNTER — Encounter: Payer: Self-pay | Admitting: Internal Medicine

## 2018-11-19 ENCOUNTER — Other Ambulatory Visit: Payer: Self-pay

## 2018-11-19 VITALS — BP 140/88 | HR 74 | Resp 12 | Ht 62.0 in | Wt 186.0 lb

## 2018-11-19 DIAGNOSIS — Z23 Encounter for immunization: Secondary | ICD-10-CM

## 2018-11-19 DIAGNOSIS — B3731 Acute candidiasis of vulva and vagina: Secondary | ICD-10-CM

## 2018-11-19 DIAGNOSIS — I10 Essential (primary) hypertension: Secondary | ICD-10-CM

## 2018-11-19 DIAGNOSIS — E1065 Type 1 diabetes mellitus with hyperglycemia: Secondary | ICD-10-CM

## 2018-11-19 DIAGNOSIS — E782 Mixed hyperlipidemia: Secondary | ICD-10-CM

## 2018-11-19 DIAGNOSIS — B373 Candidiasis of vulva and vagina: Secondary | ICD-10-CM

## 2018-11-19 LAB — GLUCOSE, POCT (MANUAL RESULT ENTRY): POC Glucose: 354 mg/dl — AB (ref 70–99)

## 2018-11-19 MED ORDER — FLUCONAZOLE 150 MG PO TABS: ORAL_TABLET | ORAL | 0 refills | Status: DC

## 2018-11-19 MED ORDER — LANTUS SOLOSTAR 100 UNIT/ML ~~LOC~~ SOPN: PEN_INJECTOR | SUBCUTANEOUS | 11 refills | Status: DC

## 2018-11-19 MED ORDER — LISINOPRIL 5 MG PO TABS: 5.0000 mg | ORAL_TABLET | Freq: Every day | ORAL | 3 refills | Status: DC

## 2018-11-19 MED ORDER — NOVOLOG FLEXPEN 100 UNIT/ML ~~LOC~~ SOPN: PEN_INJECTOR | SUBCUTANEOUS | 11 refills | Status: DC

## 2018-11-19 MED ORDER — AGAMATRIX PRESTO W/DEVICE KIT: PACK | 0 refills | Status: AC

## 2018-11-19 MED ORDER — AGAMATRIX ULTRA-THIN LANCETS MISC: 11 refills | Status: AC

## 2018-11-19 MED ORDER — AGAMATRIX PRESTO TEST VI STRP: ORAL_STRIP | 12 refills | Status: AC

## 2018-11-19 NOTE — Progress Notes (Signed)
Subjective:    Patient ID: Lisa Werner, female   DOB: 18-Dec-1980, 38 y.o.   MRN: [id number]   HPI   Here to establish  1.  IDDM:  States she was diagnosed with IDDM in 2008-2009.  Previously followed by Dr. Loanne Drilling, Endocrinology.  Appears last visit was in June of 2019. Has not been taking insulin for 2 months as unable to afford.   No insurance since lost job July of 2019.   Lowest A1C was 8.1% in 2011. Her last A1C was 14.0% in June of 2019.  States she was told she was becoming insulin resistant.   Patient was taking Novolog if sugar above 300 before each meal--this made her feel sick when she used it, but never rechecked her sugar.   Was giving Novolog about 20 minutes before meals.   She has been stretching all of her meds out and ran out of oral meds even longer ago. Do note a C peptide of 2.2 in 05/2017--need to check how this was evaluated.  No insulin level otherwise checked at same time. +polyuria, polydipsia, 22 lb weight loss since May 2019 visit.  She has had abdominal pain, but does not describe frank DKA.  No vomiting.   Does have peripheral neuropathy. She does not believe she has diabetic eye disease--checked last year and told she had 20/20 vision in January of 2019.  She does not know the clinic.      2.  Hypertension:  Was taking Lisinopril 5 mg daily.    3.  Hyperlipidemia:  Was taking Pravastatin.  4.  Having vaginal discharge, itching and burning of vaginal, perineum and anal area with some bleeding. for 2 months.  Noncompliance with meds when she did have insurance is noted.       No Known Allergies   Past Medical History:  Diagnosis Date  . Diabetes mellitus without complication ([place])   . Hyperlipidemia   . Hypertension     No past surgical history on file.   Family History  Problem Relation Age of Onset  . Arthritis Mother   . Kidney disease Mother   . Asthma Father   . Diabetes Maternal Grandmother      Social History    Socioeconomic History  . Marital status: Single    Spouse name: Not on file  . Number of children: Not on file  . Years of education: Not on file  . Highest education level: Not on file  Occupational History  . Not on file  Social Needs  . Financial resource strain: Not on file  . Food insecurity    Worry: Not on file    Inability: Not on file  . Transportation needs    Medical: Not on file    Non-medical: Not on file  Tobacco Use  . Smoking status: Never Smoker  . Smokeless tobacco: Never Used  Substance and Sexual Activity  . Alcohol use: No  . Drug use: No  . Sexual activity: Never  Lifestyle  . Physical activity    Days per week: Not on file    Minutes per session: Not on file  . Stress: Not on file  Relationships  . Social Herbalist on phone: Not on file    Gets together: Not on file    Attends religious service: Not on file    Active member of club or organization: Not on file    Attends meetings of clubs or organizations: Not  on file    Relationship status: Not on file  . Intimate partner violence    Fear of current or ex partner: Not on file    Emotionally abused: Not on file    Physically abused: Not on file    Forced sexual activity: Not on file  Other Topics Concern  . Not on file  Social History Narrative  . Not on file       Review of Systems    Objective:   BP 140/88 (BP Location: Left Arm, Patient Position: Sitting, Cuff Size: Normal)   Pulse 74   Resp 12   Ht 5\' 2"  (1.575 m)   Wt 186 lb (84.4 kg)   LMP 11/14/2018   BMI 34.02 kg/m   Physical Exam  NAD HEENT:  PERRL, EOMI, TMs pearly gray, throat without injection Neck:  Supple, no adenopathy, no thyromegaly Chest:  CTA CV:  RRR with normal S1 and S2, No S3, S4 or murmur.  Carotid, radial and DP pulses normal and equal Abd:  S, NT, No HSM or masses, + BS. GU:  White discharge without odor and mild external vaginal inflammation. LE:  No edema.   Assessment & Plan   1.  DM:  Restart Lantus and Novolog pens same as previous dosing, though instructed to take Novolog 10 minutes before eating and not just when sugar above 300.   If does not feel well, needs to check her sugar. Needs orange card for glucometer and supplies Called [facility], urine microalbumin/crea, CBC, CMP Pneumococcal 23 v vaccine today. Influenza in Sept/Oct  2.  Hypertension:  Lisinopril.  3.  Social issues:  Screen with T. Maxey  4.  Hyperlipidemia:  FLP.  No treatment of cholesterol for now--see if has orange card and then Atorvastatin  5.  Yeast vaginitis:  Fluconazole 150 mg daily for 3 days.  Get sugars under control.  6.  HM:  Tdap  Follow up in 6 weeks To call if wants pregnancy as would need to change meds.

## 2018-11-19 NOTE — Progress Notes (Signed)
OHQ-9, GAD-7, SDOH screening.  Patient states that she is currently under a lot of financial stress and unsure as how she will improve her situation. Patient is currently attending [employer], but will not be abel to return for the fall semester due to financial difficulties.  Patient stated that she is a few months behind on mortgage and HOA fees and she has a court date in late August.  Patient agreed to contact legal aide contact to confirm court date, since she wasn't sure what day it was.  Patient agreed to schedule a counseling appointment with LCSW-A, in the first week of August.

## 2018-11-20 LAB — COMPREHENSIVE METABOLIC PANEL
ALT: 10 IU/L (ref 0–32)
AST: 12 IU/L (ref 0–40)
Albumin/Globulin Ratio: 1.3 (ref 1.2–2.2)
Albumin: 4 g/dL (ref 3.8–4.8)
Alkaline Phosphatase: 100 IU/L (ref 39–117)
BUN/Creatinine Ratio: 9 (ref 9–23)
BUN: 8 mg/dL (ref 6–20)
Bilirubin Total: 0.2 mg/dL (ref 0.0–1.2)
CO2: 20 mmol/L (ref 20–29)
Calcium: 9.4 mg/dL (ref 8.7–10.2)
Chloride: 99 mmol/L (ref 96–106)
Creatinine, Ser: 0.87 mg/dL (ref 0.57–1.00)
GFR calc Af Amer: 98 mL/min/{1.73_m2} (ref >59)
GFR calc non Af Amer: 85 mL/min/{1.73_m2} (ref >59)
Globulin, Total: 3.1 g/dL (ref 1.5–4.5)
Glucose: 276 mg/dL — ABNORMAL HIGH (ref 65–99)
Potassium: 4.8 mmol/L (ref 3.5–5.2)
Sodium: 136 mmol/L (ref 134–144)
Total Protein: 7.1 g/dL (ref 6.0–8.5)

## 2018-11-20 LAB — CBC WITH DIFFERENTIAL/PLATELET
Basophils Absolute: 0.1 10*3/uL (ref 0.0–0.2)
Basos: 1 %
EOS (ABSOLUTE): 0.4 10*3/uL (ref 0.0–0.4)
Eos: 3 %
Hematocrit: 42.7 % (ref 34.0–46.6)
Hemoglobin: 13 g/dL (ref 11.1–15.9)
Immature Grans (Abs): 0 10*3/uL (ref 0.0–0.1)
Immature Granulocytes: 0 %
Lymphocytes Absolute: 3.3 10*3/uL — ABNORMAL HIGH (ref 0.7–3.1)
Lymphs: 25 %
MCH: 28.3 pg (ref 26.6–33.0)
MCHC: 30.4 g/dL — ABNORMAL LOW (ref 31.5–35.7)
MCV: 93 fL (ref 79–97)
Monocytes Absolute: 0.6 10*3/uL (ref 0.1–0.9)
Monocytes: 5 %
Neutrophils Absolute: 8.5 10*3/uL — ABNORMAL HIGH (ref 1.4–7.0)
Neutrophils: 66 %
Platelets: 328 10*3/uL (ref 150–450)
RBC: 4.6 x10E6/uL (ref 3.77–5.28)
RDW: 13.2 % (ref 11.7–15.4)
WBC: 12.9 10*3/uL — ABNORMAL HIGH (ref 3.4–10.8)

## 2018-11-20 LAB — MICROALBUMIN / CREATININE URINE RATIO
Creatinine, Urine: 75.2 mg/dL
Microalb/Creat Ratio: 834 mg/g creat — ABNORMAL HIGH (ref 0–29)
Microalbumin, Urine: 627.2 ug/mL

## 2018-11-20 LAB — LIPID PANEL W/O CHOL/HDL RATIO
Cholesterol, Total: 286 mg/dL — ABNORMAL HIGH (ref 100–199)
HDL: 46 mg/dL (ref >39)
LDL Calculated: 207 mg/dL — ABNORMAL HIGH (ref 0–99)
Triglycerides: 167 mg/dL — ABNORMAL HIGH (ref 0–149)
VLDL Cholesterol Cal: 33 mg/dL (ref 5–40)

## 2018-11-20 LAB — HGB A1C W/O EAG: Hgb A1c MFr Bld: 14.4 % — ABNORMAL HIGH (ref 4.8–5.6)

## 2018-12-01 ENCOUNTER — Other Ambulatory Visit: Payer: Self-pay | Admitting: Licensed Clinical Social Worker

## 2018-12-31 ENCOUNTER — Ambulatory Visit: Payer: Self-pay | Admitting: Internal Medicine

## 2019-01-30 ENCOUNTER — Ambulatory Visit: Payer: Self-pay | Admitting: Internal Medicine

## 2019-02-21 ENCOUNTER — Encounter: Payer: Self-pay | Admitting: Internal Medicine

## 2019-02-21 DIAGNOSIS — I10 Essential (primary) hypertension: Secondary | ICD-10-CM | POA: Insufficient documentation

## 2019-03-04 ENCOUNTER — Ambulatory Visit (INDEPENDENT_AMBULATORY_CARE_PROVIDER_SITE_OTHER): Payer: Self-pay | Admitting: Internal Medicine

## 2019-03-04 ENCOUNTER — Other Ambulatory Visit: Payer: Self-pay

## 2019-03-04 ENCOUNTER — Encounter: Payer: Self-pay | Admitting: Internal Medicine

## 2019-03-04 VITALS — BP 160/110 | HR 70 | Resp 12 | Ht 62.0 in | Wt 190.0 lb

## 2019-03-04 DIAGNOSIS — Z23 Encounter for immunization: Secondary | ICD-10-CM

## 2019-03-04 DIAGNOSIS — E1065 Type 1 diabetes mellitus with hyperglycemia: Secondary | ICD-10-CM

## 2019-03-04 DIAGNOSIS — I10 Essential (primary) hypertension: Secondary | ICD-10-CM

## 2019-03-04 DIAGNOSIS — R809 Proteinuria, unspecified: Secondary | ICD-10-CM

## 2019-03-04 DIAGNOSIS — E782 Mixed hyperlipidemia: Secondary | ICD-10-CM

## 2019-03-04 MED ORDER — LISINOPRIL 10 MG PO TABS: ORAL_TABLET | ORAL | 11 refills | Status: DC

## 2019-03-04 NOTE — Progress Notes (Signed)
Subjective:    Patient ID: Lisa Werner, female   DOB: 1980-06-25, 38 y.o.   MRN: [id number]   HPI   Lost to care since July.  1.  DM:  Treated as IDDM, but Cpeptide previously in normal range back in 2019. She is getting her insulins through [facility].  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 Has been working 3rd shift for 4-5 years. Working this job 5 days of week. 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 Full time student at [employer] for [employer].  They have a lab 3-4 days of week.   Checking sugars 2 days out of the week.  Generally in mid 200s.   Lantus 40-50 units daily. Does not really eat regular meals. Generally just snacks.    States she noted she was getting dizzy when she first started Lantus and decreased her Lantus to 40 units and felt better.  Her sugar, however, was still in the mid 200s.  Giving herself the Novolog maybe 1-2 times daily ranging from 3-5 units.  This without checking her sugars prior to dosing much of the time.  2. Hypertension and microalbuminuria:  Not taking Lisinopril.  3.  Hyperlipidemia:  She did not get orange card.  Has therefore not been started on statin.  Total cholesterol was 286 in July when established  Current Meds  Medication Sig  . AgaMatrix Ultra-Thin Lancets MISC Check blood glucose before meals 3 times daily.  . Blood Glucose Monitoring Suppl (AGAMATRIX PRESTO) w/Device KIT Check blood glucose 3 times daily before meals  . glucose blood (AGAMATRIX PRESTO TEST) test strip Check blood glucose 3 times daily before meals  . insulin aspart (NOVOLOG FLEXPEN) 100 UNIT/ML FlexPen Inject 4 units before meals 3 times daily if blood glucose is greater than 300  . Insulin Glargine (LANTUS SOLOSTAR) 100 UNIT/ML Solostar Pen 50 units injected subcutaneously every morning before breakfast  . Insulin Syringe-Needle U-100  (INSULIN SYRINGE 1CC/30GX1/2") 30G X 1/2" 1 ML MISC 1 Units by Does not apply route 4 (four) times daily -  before meals and at bedtime.  Marland Kitchen lisinopril (ZESTRIL) 5 MG tablet Take 1 tablet (5 mg total) by mouth daily.   No Known Allergies   Review of Systems    Objective:   BP (!) 160/110 (BP Location: Left Arm, Patient Position: Sitting, Cuff Size: Normal)   Pulse 70   Resp 12   Ht 5' 2"  (1.575 m)   Wt 190 lb (86.2 kg)   LMP 02/07/2019   BMI 34.75 kg/m   Physical Exam  NAD HEENT:  PERRL EOMI, TMs pearly gray Throat without injection. Neck:  Supple, No adenopathy. Chest:  CTA CV:  RRR without murmur or rub.  Radial and DP pulses normal and equal Abd:  S, NT, No HSM or mass, + BS LE:  No edema.   Assessment & Plan  1.  DM:  Needs a schedule.  She will purchase a calendar and fill out the next month with her different work and class schedules.  Appt to return in 1 week and can go over this with myself and SW intern, Jeraldine Loots. Will figure out where best to place meals and associated dosing of insulin so she is not having highs and lows as well as dizziness. Patient agrees to plan. A1C today.  2.  Hypertension/microalbuminuria:  Again, need to figure out  schedule so she does not miss her Lisinopril.  BMP today.  3.  Hyperlipidemia:  Will address once we get DM addressed and better controlled.  4.  HM: influenza vaccine today.

## 2019-03-04 NOTE — Patient Instructions (Signed)
Get a calendar and fill out with work and school schedule so we can figure out when to check your sugars, give your insulin and eat Also when to prepare meals

## 2019-03-05 LAB — BASIC METABOLIC PANEL
BUN/Creatinine Ratio: 15 (ref 9–23)
BUN: 13 mg/dL (ref 6–20)
CO2: 23 mmol/L (ref 20–29)
Calcium: 9.7 mg/dL (ref 8.7–10.2)
Chloride: 94 mmol/L — ABNORMAL LOW (ref 96–106)
Creatinine, Ser: 0.84 mg/dL (ref 0.57–1.00)
GFR calc Af Amer: 102 mL/min/{1.73_m2} (ref >59)
GFR calc non Af Amer: 88 mL/min/{1.73_m2} (ref >59)
Glucose: 429 mg/dL — ABNORMAL HIGH (ref 65–99)
Potassium: 4.6 mmol/L (ref 3.5–5.2)
Sodium: 132 mmol/L — ABNORMAL LOW (ref 134–144)

## 2019-03-05 LAB — HGB A1C W/O EAG: Hgb A1c MFr Bld: 12.7 % — ABNORMAL HIGH (ref 4.8–5.6)

## 2019-03-13 ENCOUNTER — Ambulatory Visit: Payer: Self-pay | Admitting: Internal Medicine

## 2019-04-27 ENCOUNTER — Telehealth: Payer: Self-pay | Admitting: Licensed Clinical Social Worker

## 2019-04-27 NOTE — Telephone Encounter (Signed)
Patent has three no shows

## 2019-04-27 NOTE — Telephone Encounter (Signed)
done

## 2019-06-03 ENCOUNTER — Ambulatory Visit: Payer: Self-pay | Admitting: Internal Medicine

## 2019-09-30 ENCOUNTER — Ambulatory Visit: Payer: 59 | Attending: Internal Medicine

## 2019-09-30 ENCOUNTER — Other Ambulatory Visit: Payer: Self-pay

## 2019-09-30 DIAGNOSIS — Z20822 Contact with and (suspected) exposure to covid-19: Secondary | ICD-10-CM | POA: Insufficient documentation

## 2019-10-01 LAB — SARS-COV-2, NAA 2 DAY TAT

## 2019-10-01 LAB — NOVEL CORONAVIRUS, NAA: SARS-CoV-2, NAA: NOT DETECTED

## 2019-11-30 ENCOUNTER — Other Ambulatory Visit: Payer: Self-pay | Admitting: Physician Assistant

## 2019-11-30 DIAGNOSIS — Z1231 Encounter for screening mammogram for malignant neoplasm of breast: Secondary | ICD-10-CM

## 2019-12-10 ENCOUNTER — Ambulatory Visit
Admission: RE | Admit: 2019-12-10 | Discharge: 2019-12-10 | Disposition: A | Discharge status: Home / Self Care | Payer: 59 | Source: Ambulatory Visit | Attending: Physician Assistant | Admitting: Physician Assistant

## 2019-12-10 ENCOUNTER — Other Ambulatory Visit: Payer: Self-pay

## 2019-12-10 DIAGNOSIS — Z1231 Encounter for screening mammogram for malignant neoplasm of breast: Secondary | ICD-10-CM

## 2021-01-02 IMAGING — MG DIGITAL SCREENING BILAT W/ TOMO W/ CAD
8 series · 8 of 24 positions shown · non-contrast
Comparison: None.

CLINICAL DATA: Screening.

EXAM:
DIGITAL SCREENING BILATERAL MAMMOGRAM WITH TOMO AND CAD

[R CC synth-2D]
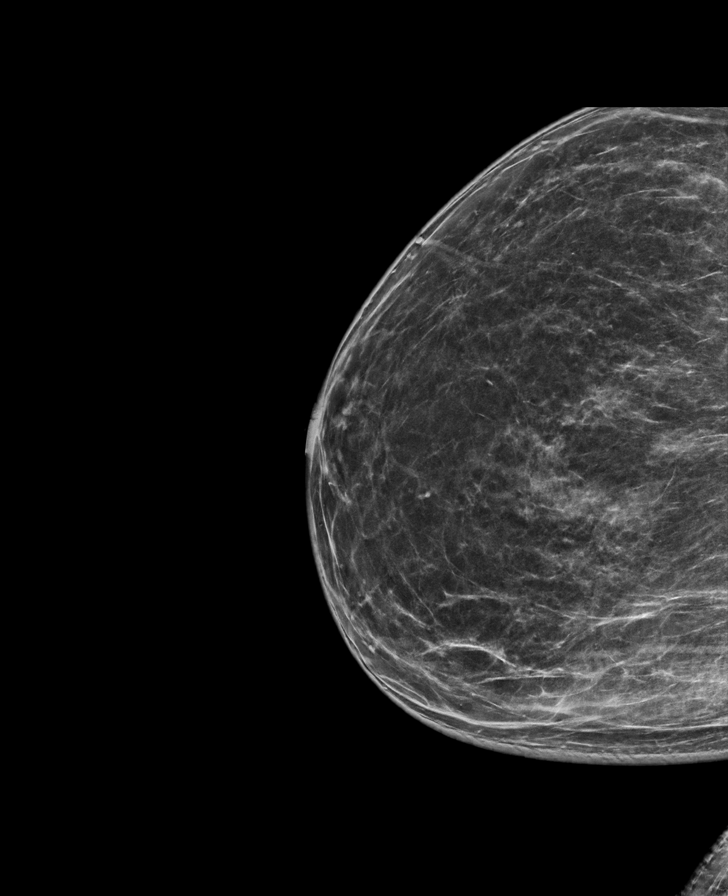

[L MLO synth-2D]
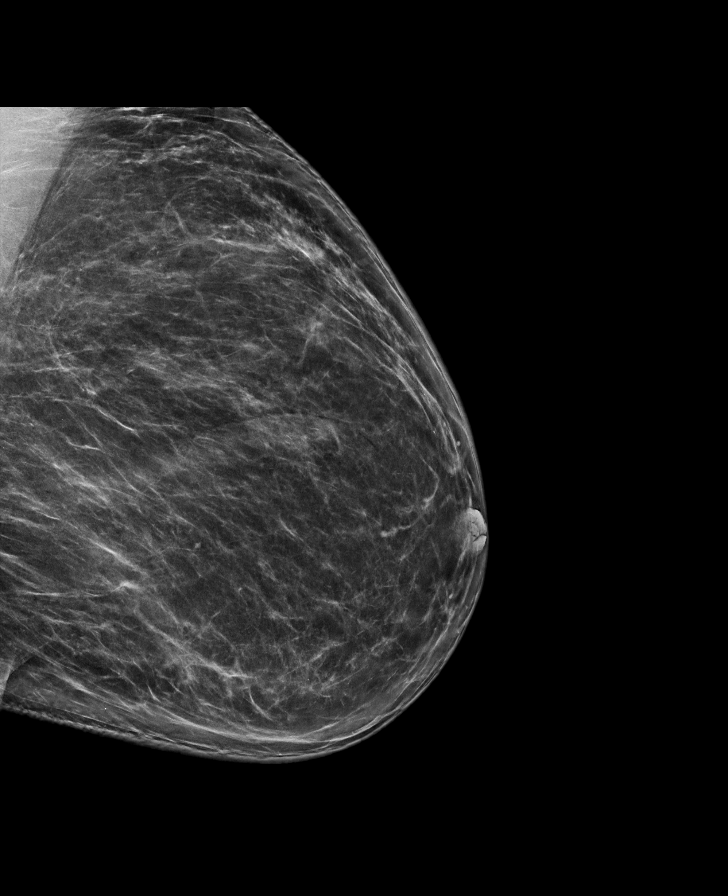

[R MLO synth-2D]
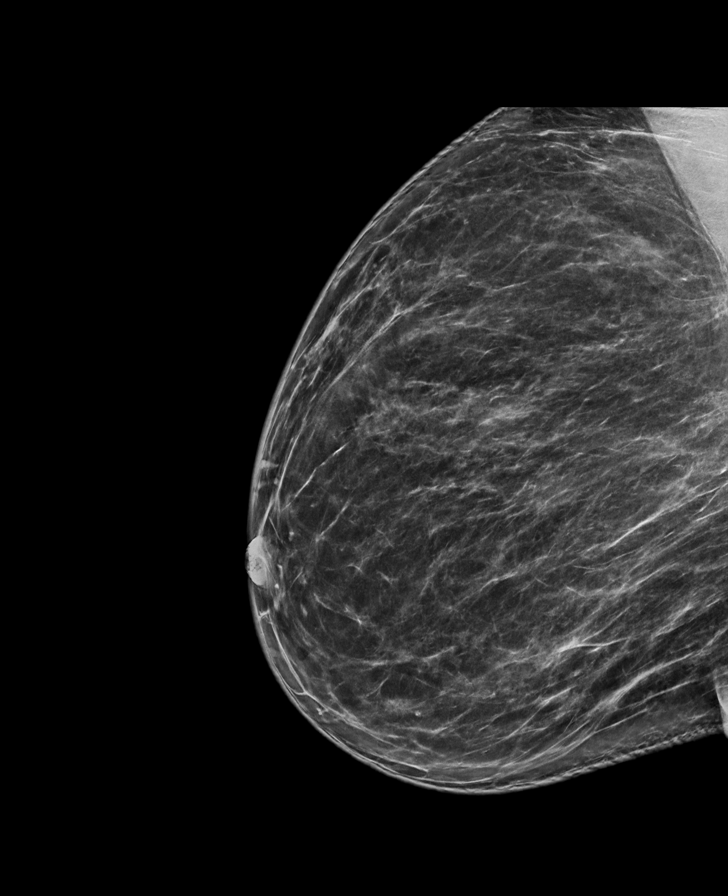

[L CC synth-2D]
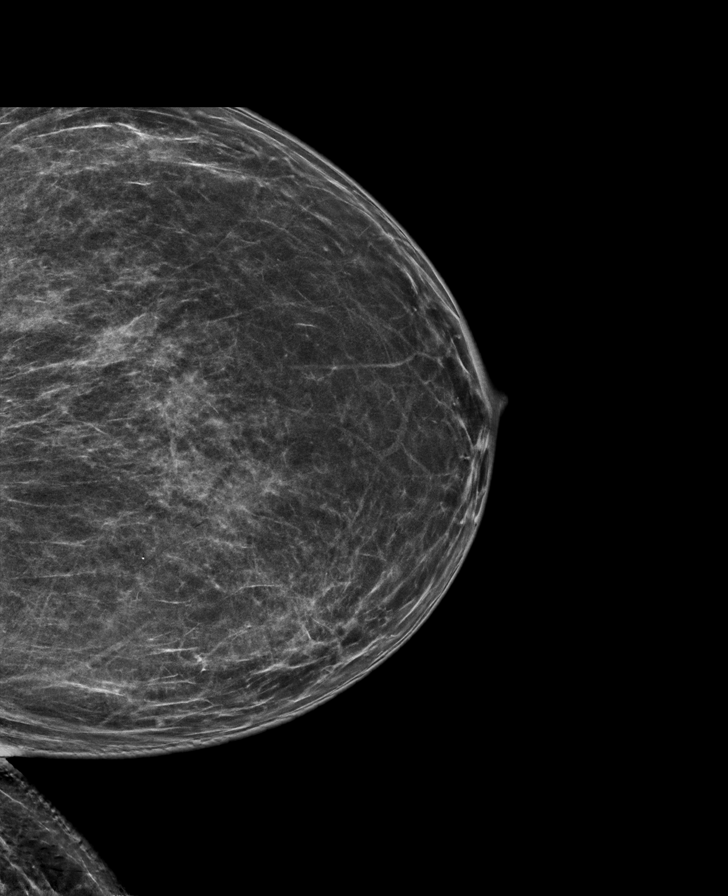

[L CC tomo · tomo slice 37/72.0]
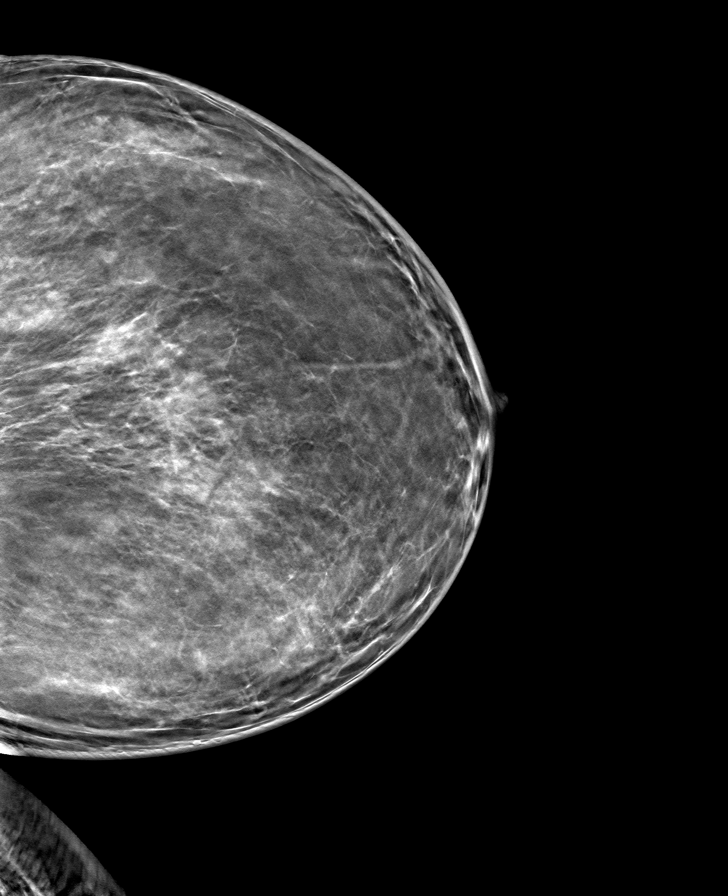

[R CC tomo · tomo slice 39/77.0]
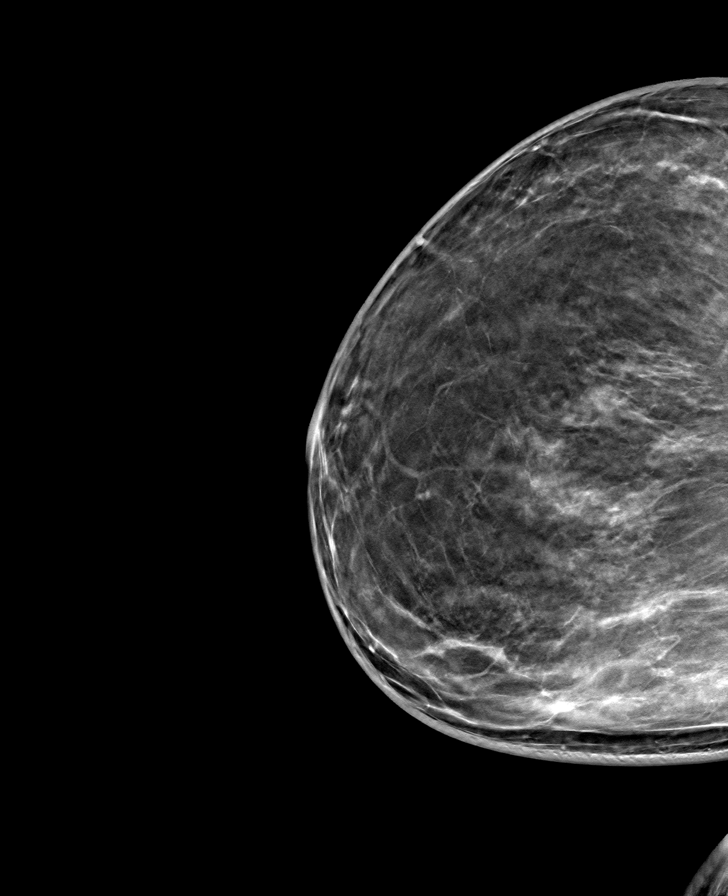

[R MLO tomo · tomo slice 42/83.0]
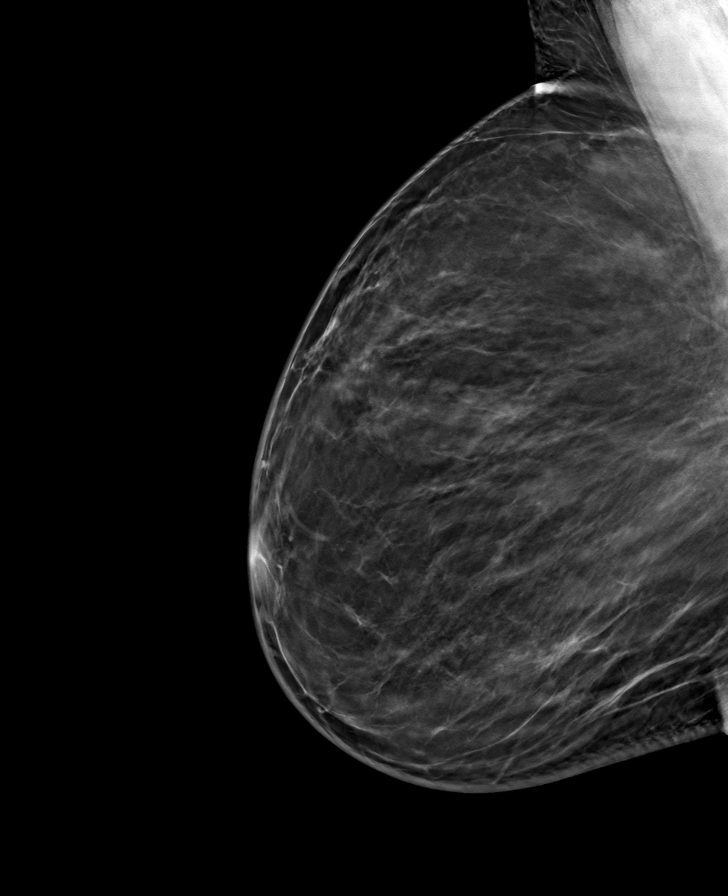

[L MLO tomo · tomo slice 45/88.0]
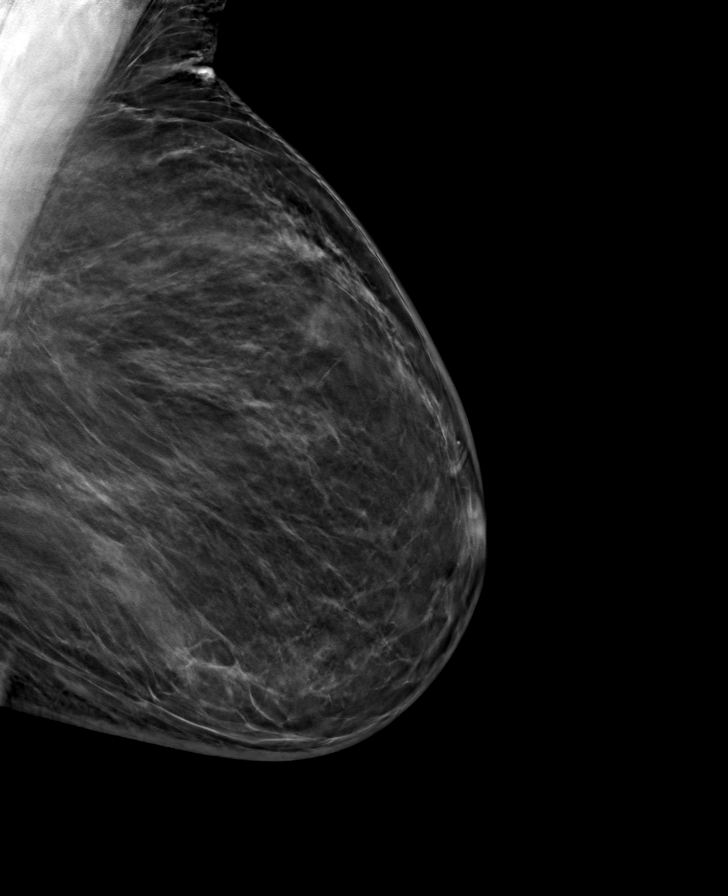

[8 of 24 positions shown; findings below may reference images not displayed]

ACR Breast Density Category b: There are scattered areas of
fibroglandular density.
FINDINGS: There are no findings suspicious for malignancy. Images were
processed with CAD.
IMPRESSION: No mammographic evidence of malignancy. A result letter of this
screening mammogram will be mailed directly to the patient.

RECOMMENDATION:
Screening mammogram at age 40. (Code:TW-R-FEN)

BI-RADS CATEGORY  1: Negative.

## 2022-12-19 ENCOUNTER — Emergency Department (HOSPITAL_COMMUNITY): Payer: BC Managed Care – PPO

## 2022-12-19 ENCOUNTER — Observation Stay (HOSPITAL_COMMUNITY): Payer: BC Managed Care – PPO

## 2022-12-19 ENCOUNTER — Inpatient Hospital Stay (HOSPITAL_COMMUNITY)
Admission: EM | Admit: 2022-12-19 | Discharge: 2022-12-24 | DRG: 065 | Disposition: A | Discharge status: Rehabilitation Facility | Payer: BC Managed Care – PPO | Source: Ambulatory Visit | Attending: Internal Medicine | Admitting: Internal Medicine

## 2022-12-19 ENCOUNTER — Other Ambulatory Visit: Payer: Self-pay

## 2022-12-19 ENCOUNTER — Encounter (HOSPITAL_COMMUNITY): Payer: Self-pay | Admitting: Internal Medicine

## 2022-12-19 DIAGNOSIS — I6329 Cerebral infarction due to unspecified occlusion or stenosis of other precerebral arteries: Principal | ICD-10-CM | POA: Diagnosis present

## 2022-12-19 DIAGNOSIS — R29704 NIHSS score 4: Secondary | ICD-10-CM | POA: Diagnosis present

## 2022-12-19 DIAGNOSIS — Z833 Family history of diabetes mellitus: Secondary | ICD-10-CM

## 2022-12-19 DIAGNOSIS — Z803 Family history of malignant neoplasm of breast: Secondary | ICD-10-CM

## 2022-12-19 DIAGNOSIS — E871 Hypo-osmolality and hyponatremia: Secondary | ICD-10-CM | POA: Diagnosis present

## 2022-12-19 DIAGNOSIS — E1165 Type 2 diabetes mellitus with hyperglycemia: Secondary | ICD-10-CM | POA: Diagnosis present

## 2022-12-19 DIAGNOSIS — I10 Essential (primary) hypertension: Secondary | ICD-10-CM | POA: Diagnosis not present

## 2022-12-19 DIAGNOSIS — G8191 Hemiplegia, unspecified affecting right dominant side: Secondary | ICD-10-CM | POA: Diagnosis present

## 2022-12-19 DIAGNOSIS — E782 Mixed hyperlipidemia: Secondary | ICD-10-CM | POA: Diagnosis present

## 2022-12-19 DIAGNOSIS — Z825 Family history of asthma and other chronic lower respiratory diseases: Secondary | ICD-10-CM

## 2022-12-19 DIAGNOSIS — K5900 Constipation, unspecified: Secondary | ICD-10-CM | POA: Diagnosis present

## 2022-12-19 DIAGNOSIS — E119 Type 2 diabetes mellitus without complications: Secondary | ICD-10-CM | POA: Diagnosis not present

## 2022-12-19 DIAGNOSIS — Z7985 Long-term (current) use of injectable non-insulin antidiabetic drugs: Secondary | ICD-10-CM

## 2022-12-19 DIAGNOSIS — E860 Dehydration: Secondary | ICD-10-CM | POA: Diagnosis present

## 2022-12-19 DIAGNOSIS — I639 Cerebral infarction, unspecified: Secondary | ICD-10-CM | POA: Diagnosis not present

## 2022-12-19 DIAGNOSIS — R739 Hyperglycemia, unspecified: Secondary | ICD-10-CM

## 2022-12-19 DIAGNOSIS — E139 Other specified diabetes mellitus without complications: Secondary | ICD-10-CM | POA: Insufficient documentation

## 2022-12-19 DIAGNOSIS — R531 Weakness: Secondary | ICD-10-CM

## 2022-12-19 DIAGNOSIS — Z79899 Other long term (current) drug therapy: Secondary | ICD-10-CM

## 2022-12-19 DIAGNOSIS — Z8261 Family history of arthritis: Secondary | ICD-10-CM

## 2022-12-19 DIAGNOSIS — R29810 Facial weakness: Secondary | ICD-10-CM | POA: Diagnosis present

## 2022-12-19 DIAGNOSIS — D72829 Elevated white blood cell count, unspecified: Secondary | ICD-10-CM | POA: Diagnosis present

## 2022-12-19 DIAGNOSIS — Z6839 Body mass index (BMI) 39.0-39.9, adult: Secondary | ICD-10-CM

## 2022-12-19 DIAGNOSIS — R29705 NIHSS score 5: Secondary | ICD-10-CM | POA: Diagnosis present

## 2022-12-19 DIAGNOSIS — Z841 Family history of disorders of kidney and ureter: Secondary | ICD-10-CM

## 2022-12-19 DIAGNOSIS — E11649 Type 2 diabetes mellitus with hypoglycemia without coma: Secondary | ICD-10-CM | POA: Diagnosis not present

## 2022-12-19 DIAGNOSIS — D6852 Prothrombin gene mutation: Secondary | ICD-10-CM | POA: Diagnosis present

## 2022-12-19 DIAGNOSIS — D6851 Activated protein C resistance: Secondary | ICD-10-CM | POA: Diagnosis present

## 2022-12-19 DIAGNOSIS — I6302 Cerebral infarction due to thrombosis of basilar artery: Secondary | ICD-10-CM

## 2022-12-19 DIAGNOSIS — Z794 Long term (current) use of insulin: Secondary | ICD-10-CM

## 2022-12-19 LAB — COMPREHENSIVE METABOLIC PANEL
ALT: 23 U/L (ref 0–44)
AST: 22 U/L (ref 15–41)
Albumin: 3 g/dL — ABNORMAL LOW (ref 3.5–5.0)
Alkaline Phosphatase: 94 U/L (ref 38–126)
Anion gap: 16 — ABNORMAL HIGH (ref 5–15)
BUN: 9 mg/dL (ref 6–20)
CO2: 20 mmol/L — ABNORMAL LOW (ref 22–32)
Calcium: 9.2 mg/dL (ref 8.9–10.3)
Chloride: 95 mmol/L — ABNORMAL LOW (ref 98–111)
Creatinine, Ser: 1 mg/dL (ref 0.44–1.00)
GFR, Estimated: >60 mL/min (ref >60)
Glucose, Bld: 402 mg/dL — ABNORMAL HIGH (ref 70–99)
Potassium: 4.4 mmol/L (ref 3.5–5.1)
Sodium: 131 mmol/L — ABNORMAL LOW (ref 135–145)
Total Bilirubin: 0.8 mg/dL (ref 0.3–1.2)
Total Protein: 7.1 g/dL (ref 6.5–8.1)

## 2022-12-19 LAB — RAPID URINE DRUG SCREEN, HOSP PERFORMED
Amphetamines: NOT DETECTED
Barbiturates: NOT DETECTED
Benzodiazepines: NOT DETECTED
Cocaine: NOT DETECTED
Opiates: NOT DETECTED
Tetrahydrocannabinol: NOT DETECTED

## 2022-12-19 LAB — CBC WITH DIFFERENTIAL/PLATELET
Abs Immature Granulocytes: 0.08 10*3/uL — ABNORMAL HIGH (ref 0.00–0.07)
Basophils Absolute: 0.1 10*3/uL (ref 0.0–0.1)
Basophils Relative: 1 %
Eosinophils Absolute: 0.3 10*3/uL (ref 0.0–0.5)
Eosinophils Relative: 2 %
HCT: 40.3 % (ref 36.0–46.0)
Hemoglobin: 13 g/dL (ref 12.0–15.0)
Immature Granulocytes: 1 %
Lymphocytes Relative: 10 %
Lymphs Abs: 1.3 10*3/uL (ref 0.7–4.0)
MCH: 29.3 pg (ref 26.0–34.0)
MCHC: 32.3 g/dL (ref 30.0–36.0)
MCV: 90.8 fL (ref 80.0–100.0)
Monocytes Absolute: 0.6 10*3/uL (ref 0.1–1.0)
Monocytes Relative: 4 %
Neutro Abs: 11 10*3/uL — ABNORMAL HIGH (ref 1.7–7.7)
Neutrophils Relative %: 82 %
Platelets: 292 10*3/uL (ref 150–400)
RBC: 4.44 MIL/uL (ref 3.87–5.11)
RDW: 13.4 % (ref 11.5–15.5)
WBC: 13.4 10*3/uL — ABNORMAL HIGH (ref 4.0–10.5)
nRBC: 0 % (ref 0.0–0.2)

## 2022-12-19 LAB — URINALYSIS, ROUTINE W REFLEX MICROSCOPIC
Bilirubin Urine: NEGATIVE
Glucose, UA: >=500 mg/dL — AB
Ketones, ur: 5 mg/dL — AB
Leukocytes,Ua: NEGATIVE
Nitrite: NEGATIVE
Protein, ur: 30 mg/dL — AB
Specific Gravity, Urine: 1.026 (ref 1.005–1.030)
pH: 5 (ref 5.0–8.0)

## 2022-12-19 LAB — CBG MONITORING, ED
Glucose-Capillary: 416 mg/dL — ABNORMAL HIGH (ref 70–99)
Glucose-Capillary: 239 mg/dL — ABNORMAL HIGH (ref 70–99)
Glucose-Capillary: 185 mg/dL — ABNORMAL HIGH (ref 70–99)
Glucose-Capillary: 253 mg/dL — ABNORMAL HIGH (ref 70–99)

## 2022-12-19 LAB — BETA-HYDROXYBUTYRIC ACID: Beta-Hydroxybutyric Acid: 0.14 mmol/L (ref 0.05–0.27)

## 2022-12-19 LAB — I-STAT VENOUS BLOOD GAS, ED
Acid-base deficit: 4 mmol/L — ABNORMAL HIGH (ref 0.0–2.0)
Bicarbonate: 20.3 mmol/L (ref 20.0–28.0)
Calcium, Ion: 1.11 mmol/L — ABNORMAL LOW (ref 1.15–1.40)
HCT: 40 % (ref 36.0–46.0)
Hemoglobin: 13.6 g/dL (ref 12.0–15.0)
O2 Saturation: 64 %
Potassium: 4.3 mmol/L (ref 3.5–5.1)
Sodium: 130 mmol/L — ABNORMAL LOW (ref 135–145)
TCO2: 21 mmol/L — ABNORMAL LOW (ref 22–32)
pCO2, Ven: 34.4 mmHg — ABNORMAL LOW (ref 44–60)
pH, Ven: 7.379 (ref 7.25–7.43)
pO2, Ven: 33 mmHg (ref 32–45)

## 2022-12-19 LAB — ETHANOL: Alcohol, Ethyl (B): <10 mg/dL (ref <10)

## 2022-12-19 LAB — MAGNESIUM: Magnesium: 1.7 mg/dL (ref 1.7–2.4)

## 2022-12-19 MED ORDER — STROKE: EARLY STAGES OF RECOVERY BOOK: Freq: Once | Status: DC

## 2022-12-19 MED ORDER — IOHEXOL 350 MG/ML SOLN
75.0000 mL | Freq: Once | INTRAVENOUS | Status: AC | PRN
  Administered 2022-12-19: 75 mL via INTRAVENOUS

## 2022-12-19 MED ORDER — LACTATED RINGERS IV BOLUS
1000.0000 mL | Freq: Once | INTRAVENOUS | Status: AC
  Administered 2022-12-19: 1000 mL via INTRAVENOUS

## 2022-12-19 MED ORDER — ONDANSETRON HCL 4 MG/2ML IJ SOLN: 4.0000 mg | Freq: Four times a day (QID) | INTRAMUSCULAR | Status: DC | PRN

## 2022-12-19 MED ORDER — INSULIN ASPART 100 UNIT/ML IJ SOLN
0.0000 [IU] | Freq: Three times a day (TID) | INTRAMUSCULAR | Status: DC
  Administered 2022-12-20: 5 [IU] via SUBCUTANEOUS
  Administered 2022-12-20: 11 [IU] via SUBCUTANEOUS
  Administered 2022-12-20: 5 [IU] via SUBCUTANEOUS
  Administered 2022-12-21: 11 [IU] via SUBCUTANEOUS
  Administered 2022-12-21: 8 [IU] via SUBCUTANEOUS
  Administered 2022-12-21: 5 [IU] via SUBCUTANEOUS
  Administered 2022-12-22: 3 [IU] via SUBCUTANEOUS
  Administered 2022-12-22: 5 [IU] via SUBCUTANEOUS
  Administered 2022-12-22: 8 [IU] via SUBCUTANEOUS
  Administered 2022-12-23 (×3): 3 [IU] via SUBCUTANEOUS
  Administered 2022-12-24: 5 [IU] via SUBCUTANEOUS
  Administered 2022-12-24: 3 [IU] via SUBCUTANEOUS

## 2022-12-19 MED ORDER — ACETAMINOPHEN 325 MG PO TABS: 650.0000 mg | ORAL_TABLET | Freq: Four times a day (QID) | ORAL | Status: DC | PRN

## 2022-12-19 MED ORDER — MELATONIN 3 MG PO TABS: 3.0000 mg | ORAL_TABLET | Freq: Every evening | ORAL | Status: DC | PRN

## 2022-12-19 MED ORDER — ACETAMINOPHEN 650 MG RE SUPP: 650.0000 mg | Freq: Four times a day (QID) | RECTAL | Status: DC | PRN

## 2022-12-19 MED ORDER — INSULIN GLARGINE-YFGN 100 UNIT/ML ~~LOC~~ SOLN
10.0000 [IU] | Freq: Every day | SUBCUTANEOUS | Status: DC
  Administered 2022-12-19: 10 [IU] via SUBCUTANEOUS
  Filled 2022-12-19 (×2): qty 0.1

## 2022-12-19 MED ORDER — INSULIN ASPART 100 UNIT/ML IJ SOLN
0.0000 [IU] | Freq: Every day | INTRAMUSCULAR | Status: DC
  Administered 2022-12-19: 3 [IU] via SUBCUTANEOUS
  Administered 2022-12-20: 2 [IU] via SUBCUTANEOUS
  Administered 2022-12-21: 3 [IU] via SUBCUTANEOUS
  Administered 2022-12-22 – 2022-12-23 (×2): 2 [IU] via SUBCUTANEOUS

## 2022-12-19 MED ORDER — INSULIN ASPART 100 UNIT/ML IJ SOLN
15.0000 [IU] | Freq: Once | INTRAMUSCULAR | Status: AC
  Administered 2022-12-19: 15 [IU] via SUBCUTANEOUS

## 2022-12-19 MED ORDER — INSULIN ASPART 100 UNIT/ML IJ SOLN: 5.0000 [IU] | Freq: Once | INTRAMUSCULAR | Status: DC

## 2022-12-19 MED ORDER — ROSUVASTATIN CALCIUM 20 MG PO TABS
20.0000 mg | ORAL_TABLET | Freq: Every day | ORAL | Status: DC
  Administered 2022-12-19: 20 mg via ORAL
  Filled 2022-12-19: qty 1

## 2022-12-19 NOTE — ED Triage Notes (Signed)
Pt BIB EMS from PCP, was being seen for right sided weakness that has been going on for approx 4 days. At office pt had episode of explosive diarrhea and high blood sugar. Pt states only one additional episode of diarrhea in past 24 hours. CBG with EMS 536. Aox4.

## 2022-12-19 NOTE — H&P (Signed)
History and Physical      Lisa Werner JYN:[id number] DOB: 11/08/1980 DOA: 12/19/2022; DOS: 12/19/2022  PCP: Julieanne Manson, MD  Patient coming from: home   I have personally briefly reviewed patient's old medical records in [facility]  Chief Complaint: right-sided weakness  HPI: Lisa Werner is a 42 y.o. female with medical history significant for type 2 diabetes mellitus, hypertension, hyperlipidemia, who is admitted to [facility] on 12/19/2022 with acute ischemic stroke after presenting from home to [facility] ED complaining of right-sided weakness.   The patient reports persistent right-sided weakness involving the right upper extremity as well as right lower extremity starting 4 days ago.  Denies any associated additional acute focal weakness, nor any associated acute focal numbness, paresthesias, dysphagia, dizziness, vertigo, nausea, vomiting, acute change in vision, word finding difficulties, slurring of speech, facial droop, or headache.  She also denies any recent chest pain, shortness of breath, palpitations, diaphoresis, presyncope, or syncope.  Denies any known prior history of stroke.  Medical history is notable for poorly controlled type 2 diabetes mellitus, with most recent hemoglobin A1c noted to be 12.7% in November 2020.  She also has a history that includes hypertension and hyperlipidemia.  Denies any known history of paroxysmal atrial fibrillation or any known history of obstructive sleep apnea.  Conveys that she is a lifelong non-smoker.  Not on any antiplatelet or anticoagulant medications as an outpatient, including no aspirin.  She is currently on rosuvastatin 20 mg p.o. daily at home.  Medical history also appears to be notable for chronic leukocytosis, with chart review revealing the following prior lipid cell count data points: Will blood cell count of 12.9 in July 2020, 14.6 in May 2019, 12.3 in February 2019.    ED Course:   Vital signs in the ED were notable for the following: Afebrile; heart rates in the 80s to 90s; systolic blood pressures in the 120s to 140s; respiratory rate 15-23, oxygen saturation 98 to 100% on room air.  Labs were notable for the following: CMP notable for the following: Sodium 131, which corrected to approximately 136 when taking into account concomitant hyperglycemia, potassium 4.4, bicarbonate 20, anion gap 16, creatinine 1.0 compared to most recent prior serum creatinine did one 0.84 in November 2020, glucose 402, liver enzymes within normal limits.  Serum ethanol level less than 10.  Beta hydroxybutyric acid 0.14.  VBG 7.379/34.4.  CBC notable for will with cell count 13.4 compared to most recent prior value of 12.9 and July 2020, hemoglobin 13, platelet count 292.  Urinary drug screen was pan negative.  Urinalysis notable for no white blood cells, leukocyte esterase/nitrate negative.   Initial CBG 416, with repeat trending down to 239 following interval IV fluids and short acting insulin, as further detailed below, with most recent CBG noted to be 185.  Per my interpretation, EKG in ED demonstrated the following: Sinus rhythm with left anterior fascicular block, heart rate 93, no evidence of T wave or ST changes, including no evidence of ST elevation.  Imaging in the ED, per corresponding formal radiology read, was notable for the following: Noncontrast CT head showed no evidence of acute intracranial process, including no evidence of acute infarct or intracranial hemorrhage.  MRI brain, per pulm report, showed evidence of acute infarct involving the left pons.  MRA head pending.   EDP discussed patient's case and imaging with the on-call neurologist, Dr.  Wilford Corner, who recommended admission to the hospitalist service for further evaluation/management of  acute ischemic CVA, as well as further assessment of potential modifiable ischemic CVA risk factors.  Neurology to formally consult, with  additional recommendations to follow.  While in the ED, the following were administered: NovoLog 15 units SQ x 1 dose, lactated Ringer's x 1 L bolus.  Subsequently, the patient was admitted for further evaluation management of acute ischemic stroke involving the left pons.      Review of Systems: As per HPI otherwise 10 point review of systems negative.   Past Medical History:  Diagnosis Date   Diabetes mellitus without complication (HCC)    Hyperlipidemia    Hypertension     History reviewed. No pertinent surgical history.  Social History:  reports that she has never smoked. She has never used smokeless tobacco. She reports that she does not drink alcohol and does not use drugs.   Allergies  Allergen Reactions   Pollen Extract     Family History  Problem Relation Age of Onset   Arthritis Mother    Kidney disease Mother    Asthma Father    Diabetes Maternal Grandmother    Breast cancer Maternal Grandmother    Breast cancer Paternal Grandmother     Family history reviewed and not pertinent    Prior to Admission medications   Medication Sig Start Date End Date Taking? Authorizing Provider  albuterol (VENTOLIN HFA) 108 (90 Base) MCG/ACT inhaler Inhale 2 puffs into the lungs every 6 (six) hours as needed for wheezing. 11/21/22  Yes [provider]  hydrochlorothiazide (HYDRODIURIL) 12.5 MG tablet Take 12.5 mg by mouth in the morning. 10/25/22  Yes [provider]  insulin aspart (NOVOLOG FLEXPEN) 100 UNIT/ML FlexPen Inject 4 units before meals 3 times daily if blood glucose is greater than 300 Patient taking differently: Inject 15 Units into the skin 3 (three) times daily with meals. 11/19/18  Yes Julieanne Manson, MD  rosuvastatin (CRESTOR) 20 MG tablet Take 20 mg by mouth at bedtime. 11/05/22  Yes [provider]  TRESIBA FLEXTOUCH 100 UNIT/ML FlexTouch Pen Inject 16 Units into the skin at bedtime. 04/24/22  Yes [provider]   AgaMatrix Ultra-Thin Lancets MISC Check blood glucose before meals 3 times daily. 11/19/18   Julieanne Manson, MD  Blood Glucose Monitoring Suppl (AGAMATRIX PRESTO) w/Device KIT Check blood glucose 3 times daily before meals 11/19/18   Julieanne Manson, MD  fluconazole (DIFLUCAN) 150 MG tablet 1 tab by mouth daily for 3 days. Patient not taking: Reported on 03/04/2019 11/19/18   Julieanne Manson, MD  glucose blood (AGAMATRIX PRESTO TEST) test strip Check blood glucose 3 times daily before meals 11/19/18   Julieanne Manson, MD  Insulin Glargine (LANTUS SOLOSTAR) 100 UNIT/ML Solostar Pen 50 units injected subcutaneously every morning before breakfast Patient not taking: Reported on 12/19/2022 11/19/18   Julieanne Manson, MD  Insulin Syringe-Needle U-100 (INSULIN SYRINGE 1CC/30GX1/2") 30G X 1/2" 1 ML MISC 1 Units by Does not apply route 4 (four) times daily -  before meals and at bedtime. 07/24/17   Nche, Bonna Gains, NP  lisinopril (ZESTRIL) 10 MG tablet 1 tab by mouth daily after out of 5 mg tabs Patient not taking: Reported on 12/19/2022 03/04/19   Julieanne Manson, MD  pravastatin (PRAVACHOL) 20 MG tablet Take 1 tablet (20 mg total) by mouth daily. Patient not taking: Reported on 11/19/2018 07/24/17   Anne Ng, NP     Objective    Physical Exam: Vitals:   12/19/22 1645 12/19/22 1945 12/19/22 2000 12/19/22 2023  BP: (!) 127/93 123/76 124/71   Pulse: 91 90 88   Resp: 18 18 18    Temp:    98.3 F (36.8 C)  TempSrc:    Oral  SpO2: 100% 100% 99%   Weight:      Height:        General: appears to be stated age; alert, oriented Skin: warm, dry, no rash Head:  AT/[place] Mouth:  Oral mucosa membranes appear moist, normal dentition Neck: supple; trachea midline Heart:  RRR; did not appreciate any M/R/G Lungs: CTAB, did not appreciate any wheezes, rales, or rhonchi Abdomen: + BS; soft, ND, NT Vascular: 2+ pedal pulses b/l; 2+ radial pulses b/l Extremities: no peripheral  edema, no muscle wasting Neuro: 3/5 strength in the right upper and right lower extremities; 5 out of 5 strength in the left upper and left lower extremities; sensation intact in upper and lower extremities b/l; cranial nerves II through XII grossly intact; no evidence suggestive of slurred speech, dysarthria, or facial droop; Normal muscle tone. No tremors.    Labs on Admission: I have personally reviewed following labs and imaging studies  CBC: Recent Labs  Lab 12/19/22 1136 12/19/22 1258  WBC 13.4*  --   NEUTROABS 11.0*  --   HGB 13.0 13.6  HCT 40.3 40.0  MCV 90.8  --   PLT 292  --    Basic Metabolic Panel: Recent Labs  Lab 12/19/22 1136 12/19/22 1258  NA 131* 130*  K 4.4 4.3  CL 95*  --   CO2 20*  --   GLUCOSE 402*  --   BUN 9  --   CREATININE 1.00  --   CALCIUM 9.2  --   MG 1.7  --    GFR: Estimated Creatinine Clearance: 77.5 mL/min (by C-G formula based on SCr of 1 mg/dL). Liver Function Tests: Recent Labs  Lab 12/19/22 1136  AST 22  ALT 23  ALKPHOS 94  BILITOT 0.8  PROT 7.1  ALBUMIN 3.0*   No results for input(s): "LIPASE", "AMYLASE" in the last 168 hours. No results for input(s): "AMMONIA" in the last 168 hours. Coagulation Profile: No results for input(s): "INR", "PROTIME" in the last 168 hours. Cardiac Enzymes: No results for input(s): "CKTOTAL", "CKMB", "CKMBINDEX", "TROPONINI" in the last 168 hours. BNP (last 3 results) No results for input(s): "PROBNP" in the last 8760 hours. HbA1C: No results for input(s): "HGBA1C" in the last 72 hours. CBG: Recent Labs  Lab 12/19/22 1128 12/19/22 1608 12/19/22 1658  GLUCAP 416* 239* 185*   Lipid Profile: No results for input(s): "CHOL", "HDL", "LDLCALC", "TRIG", "CHOLHDL", "LDLDIRECT" in the last 72 hours. Thyroid Function Tests: No results for input(s): "TSH", "T4TOTAL", "FREET4", "T3FREE", "THYROIDAB" in the last 72 hours. Anemia Panel: No results for input(s): "VITAMINB12", "FOLATE", "FERRITIN",  "TIBC", "IRON", "RETICCTPCT" in the last 72 hours. Urine analysis:    Component Value Date/Time   COLORURINE YELLOW (A) 12/19/2022 1228   APPEARANCEUR CLOUDY (A) 12/19/2022 1228   LABSPEC 1.026 12/19/2022 1228   PHURINE 5.0 12/19/2022 1228   GLUCOSEU >=500 (A) 12/19/2022 1228   HGBUR MODERATE (A) 12/19/2022 1228   BILIRUBINUR NEGATIVE 12/19/2022 1228   BILIRUBINUR neg 09/12/2017 1438   KETONESUR 5 (A) 12/19/2022 1228   PROTEINUR 30 (A) 12/19/2022 1228   UROBILINOGEN 0.2 09/12/2017 1438   UROBILINOGEN 0.2 04/01/2014 0535   NITRITE NEGATIVE 12/19/2022 1228   LEUKOCYTESUR NEGATIVE 12/19/2022 1228    Radiological Exams on Admission: MR BRAIN WO CONTRAST  Result Date:  12/19/2022 CLINICAL DATA:  Stroke suspected EXAM: MRI HEAD WITHOUT CONTRAST MRA HEAD WITHOUT CONTRAST TECHNIQUE: Multiplanar, multi-echo pulse sequences of the brain and surrounding structures were acquired without intravenous contrast. Angiographic images of the Circle of Willis were acquired using MRA technique without intravenous contrast. COMPARISON:  No prior MRI available, correlation is made with CT head 12/19/2022 FINDINGS: MRI HEAD FINDINGS Brain: Restricted diffusion with ADC correlate in the left pons (series 5, images 73-76), which measures up to 1.5 x 1.2 x 0.9 cm. This area is associated with increased T2 hyperintense signal, likely cytotoxic edema. No acute hemorrhage, mass, mass effect, or midline shift. No hydrocephalus or extra-axial collection. Normal pituitary and craniocervical junction. Hemosiderin deposition in the right thalamus and cerebral peduncle, likely sequela of prior hypertensive microhemorrhage. Vascular: Please see MRA findings below. Skull and upper cervical spine: Normal marrow signal. Sinuses/Orbits: Mucosal thickening in the ethmoid air cells. No acute finding in the orbits. Other: The mastoid air cells are well aerated. MRA HEAD FINDINGS Anterior circulation: Both internal carotid arteries are  patent to the termini, without significant stenosis. A1 segments patent. Normal anterior communicating artery. Anterior cerebral arteries are patent to their distal aspects without significant stenosis. 1-2 mm laterally directed outpouching from the proximal right A2 (series 9, image 111), which may represent a tiny aneurysm versus an infundibulum. No M1 stenosis or occlusion. Distal MCA branches perfused to their distal aspects without significant stenosis. Posterior circulation: Right dominant system, with relatively large right vertebral and basilar arteries, which are otherwise unremarkable; no evidence of aneurysmal dilatation or significant tortuosity. Vertebral arteries patent to the vertebrobasilar junction without stenosis. Basilar patent to its distal aspect. Superior cerebellar arteries patent proximally. Patent P1 segments. PCAs perfused to their distal aspects without significant stenosis. The right posterior communicating artery is patent. Anatomic variants: None significant IMPRESSION: 1. Acute infarct in the left pons. 2. No intracranial large vessel occlusion or significant stenosis. 3. 1-2 mm laterally directed outpouching from the proximal right A2, which may represent a tiny aneurysm versus an infundibulum. These results were called by telephone at the time of interpretation on 12/19/2022 at 8:25 pm to provider [facility], who verbally acknowledged these results. Electronically Signed   By: Wiliam Ke M.D.   On: 12/19/2022 20:25   MR ANGIO HEAD WO CONTRAST  Result Date: 12/19/2022 CLINICAL DATA:  Stroke suspected EXAM: MRI HEAD WITHOUT CONTRAST MRA HEAD WITHOUT CONTRAST TECHNIQUE: Multiplanar, multi-echo pulse sequences of the brain and surrounding structures were acquired without intravenous contrast. Angiographic images of the Circle of Willis were acquired using MRA technique without intravenous contrast. COMPARISON:  No prior MRI available, correlation is made with CT head 12/19/2022  FINDINGS: MRI HEAD FINDINGS Brain: Restricted diffusion with ADC correlate in the left pons (series 5, images 73-76), which measures up to 1.5 x 1.2 x 0.9 cm. This area is associated with increased T2 hyperintense signal, likely cytotoxic edema. No acute hemorrhage, mass, mass effect, or midline shift. No hydrocephalus or extra-axial collection. Normal pituitary and craniocervical junction. Hemosiderin deposition in the right thalamus and cerebral peduncle, likely sequela of prior hypertensive microhemorrhage. Vascular: Please see MRA findings below. Skull and upper cervical spine: Normal marrow signal. Sinuses/Orbits: Mucosal thickening in the ethmoid air cells. No acute finding in the orbits. Other: The mastoid air cells are well aerated. MRA HEAD FINDINGS Anterior circulation: Both internal carotid arteries are patent to the termini, without significant stenosis. A1 segments patent. Normal anterior communicating artery. Anterior cerebral arteries are patent to their distal  aspects without significant stenosis. 1-2 mm laterally directed outpouching from the proximal right A2 (series 9, image 111), which may represent a tiny aneurysm versus an infundibulum. No M1 stenosis or occlusion. Distal MCA branches perfused to their distal aspects without significant stenosis. Posterior circulation: Right dominant system, with relatively large right vertebral and basilar arteries, which are otherwise unremarkable; no evidence of aneurysmal dilatation or significant tortuosity. Vertebral arteries patent to the vertebrobasilar junction without stenosis. Basilar patent to its distal aspect. Superior cerebellar arteries patent proximally. Patent P1 segments. PCAs perfused to their distal aspects without significant stenosis. The right posterior communicating artery is patent. Anatomic variants: None significant IMPRESSION: 1. Acute infarct in the left pons. 2. No intracranial large vessel occlusion or significant stenosis. 3.  1-2 mm laterally directed outpouching from the proximal right A2, which may represent a tiny aneurysm versus an infundibulum. These results were called by telephone at the time of interpretation on 12/19/2022 at 8:25 pm to provider [facility], who verbally acknowledged these results. Electronically Signed   By: Wiliam Ke M.D.   On: 12/19/2022 20:25   CT Head Wo Contrast  Result Date: 12/19/2022 CLINICAL DATA:  Hyperglycemia and weakness. EXAM: CT HEAD WITHOUT CONTRAST TECHNIQUE: Contiguous axial images were obtained from the base of the skull through the vertex without intravenous contrast. RADIATION DOSE REDUCTION: This exam was performed according to the departmental dose-optimization program which includes automated exposure control, adjustment of the mA and/or kV according to patient size and/or use of iterative reconstruction technique. COMPARISON:  None Available. FINDINGS: Brain: No evidence of acute infarction, hemorrhage, hydrocephalus, extra-axial collection or mass lesion/mass effect. Vascular: The basilar artery measures approximately 6 mm in diameter and is mildly calcified and tortuous in appearance. Skull: Normal. Negative for fracture or focal lesion. Sinuses/Orbits: No acute finding. Other: None. IMPRESSION: 1. No acute intracranial abnormality. 2. 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 Electronically Signed   By: Aram Candela M.D.   On: 12/19/2022 15:15      Assessment/Plan   Principal Problem:   Acute ischemic stroke (HCC) Active Problems:   DM2 (diabetes mellitus, type 2) (HCC)   Mixed hyperlipidemia   Essential hypertension   Leukocytosis     #) Acute ischemic CVA involving left pons: dx on the basis of acute onset of right hemiparesis starting 4 days ago, with persistence of symptoms, and MRI brain, per preliminary report, showing evidence of acute infarct involving the left pons, while noting that CT head shows no e/o acute  intracranial process, including no e/o acute intracranial hemorrhage and no e/o acute infarct. Additionally, CTA head and neck has been ordered by neuro with result currently pending.   EDP discussed patient's case and imaging with the on-call neurologist, Dr.  Wilford Corner, who recommended admission to the hospitalist service for further evaluation/management of acute ischemic CVA, as well as further assessment of potential modifiable ischemic CVA risk factors.  Neurology to formally consult, with additional recommendations to follow.   The patient possesses multiple modifiable CVA risk factors including a history of poorly controlled type 2 diabetes mellitus, essential hypertension, hyperlipidemia. No known history of paroxysmal atrial fibrillation or obstructive sleep apnea, will also noting that the patient is a lifelong non-smoker.  Sinus rhythm without EKG in ED today showed evidence of acute ischemic changes. Will perform further assessment of modifiable ischemic CVA risk factors, detailed below.   Not a candidate for TPA administration nor thrombectomy given that she presented well outside of the window for  consideration for such. Current outpatient antiplatelet/anticoagulant regimen: None. Will follow for neuro recommendations regarding antiplatelet therapy. Current outpatient anti-lipid regimen: Rosuvastatin 20 mg p.o. nightly.   Of note, given the timing of onset of the patient's symptoms, she is outside of the window for observance of permissive hypertension at this time.   Plan: Nursing bedside swallow evaluation x 1 now, and will not initiate oral medications or diet until the patient has passed this. Head of the bed at 30 degrees. Neuro checks per protocol. VS per protocol. Monitor on telemetry, including monitoring for atrial fibrillation as modifiable risk factor for acute ischemic CVA.  Neurology to formally consult, as above.  CTA head and neck per neurology.  TTE with bubble study has been  ordered for the morning. Additionally, as component of evaluation of potential modifiable ischemic CVA risk factors, will also check lipid panel and A1c. PT/OT/ST consults have been ordered for the morning.  Platelet intervention, per neurology recommendation.                #) Chronic leukocytosis: For at least the last 5 years, patient has demonstrated evidence of leukocytosis, dating back to at least February 2019, with white blood cell count range over that timeframe noted to be 12-15, with today's white blood cell count consistent with this baseline range.  Etiology for this is not entirely clear to me at this time.  Will attempt additional chart review to ascertain.  No evidence of acute underlying infection at this time, including urinalysis that was inconsistent with UTI, while the patient also possesses no acute respiratory symptoms to warrant further evaluation via chest x-ray.  Overall, in the absence of any evidence of underlying infection at this time, criteria for sepsis not currently met.  Plan: Repeat CBC in the morning.                   #) Type 2 Diabetes Mellitus: documented history of such, which appears poorly controlled, with most recent hemoglobin A1c noted to be 12.7% when checked in November 2020.  Notable as a modifiable ischemic CVA risk factor in the context of her presenting acute ischemic stroke. Home insulin regimen: Tresiba 16 units SQ nightly as well as scheduled NovoLog 15 units 3 times daily with meals. Home oral hypoglycemic agents: None. presenting blood sugar: In the low 400s, subsequently proving to 185 following interval 1 L of IV fluids as well as 15 units of NovoLog x 1.  While CMP reflected a very mild anion gap metabolic acidosis, presentation does not appear consistent with DKA, given the nonelevated nature of her presenting beta-hydroxybutyrate acid, as well as VBG, which did not demonstrate evidence of metabolic acidemia.   in  terms of initial dose of basal insulin to be started during this hospitalization, will resume approximately half of outpatient dose in order to reduce risk for ensuing hypoglycemia.  Plan: accuchecks QAC and HS with low dose SSI.  Lantus 10 units SQ nightly, as above.  Add on hemoglobin A1c level.                 #) Essential Hypertension: documented h/o such, with outpatient antihypertensive regimen including HCTZ.  As the patient appears mildly dehydrated at this time, will hold home HCTZ TZ for now, while noting that there is not a current indication for observance of permissive hypertension, given that she is outside of the window for such in the setting of her presenting acute ischemic CVA, as further detailed above.  SBP's in the ED today: 120s to 140s mmHg.   Plan: Close monitoring of subsequent BP via routine VS. hold home HCTZ for now, as above.  Monitor strict I's and O's and daily weights.                   #) Hyperlipidemia: documented h/o such. On rosuvastatin 20 mg p.o. nightly as outpatient.   Plan: continue home statin.  Follow for result of lipid panel, as above.        DVT prophylaxis: SCD's   Code Status: Full code Family Communication: none Disposition Plan: Per Rounding Team Consults called: EDP has d/w on-call neurology, Dr. Wilford Corner, who will formally consult.  Admission status: Observation     I SPENT GREATER THAN 75  MINUTES IN CLINICAL CARE TIME/MEDICAL DECISION-MAKING IN COMPLETING THIS ADMISSION.      Chaney Born Khylon Davies DO [facility]  From 7PM - 7AM   12/19/2022, 8:32 PM

## 2022-12-19 NOTE — Consult Note (Addendum)
Neurology Consultation  Reason for Consult: Stroke  Referring Physician: Dr Rubin Payor  CC: Right-sided weakness  History is obtained from: Patient, chart  HPI: Lisa Werner is a 42 y.o. female past medical history of hypertension, hyperlipidemia, diabetes brought in for emergent evaluation from primary care office for right-sided weakness that has been going on since Saturday, 12/15/2022.  Patient reports that she started noticing that her right side is not working as well as the left side.  Denies any slurred speech.  Denies any facial asymmetry.  She has been having trouble with ambulation.  She went to her doctor who recommended she be brought to the ER for emergent evaluation.  CBG with EMS was 536.  She says that she is compliant to medications but missed morning insulin. Denies headaches.  Denies vision changes denies chest pain shortness of breath.  Denies dysphagia  LKW: Sometime on Saturday, 12/15/2022 IV thrombolysis given?: no, outside the window EVT: No-outside the window, stroke also likely small vessel etiology Premorbid modified Rankin scale (mRS): 0   ROS: Full ROS was performed and is negative except as noted in the HPI.   Past Medical History:  Diagnosis Date   Diabetes mellitus without complication (HCC)    Hyperlipidemia    Hypertension     Family History  Problem Relation Age of Onset   Arthritis Mother    Kidney disease Mother    Asthma Father    Diabetes Maternal Grandmother    Breast cancer Maternal Grandmother    Breast cancer Paternal Grandmother     Social History:   reports that she has never smoked. She has never used smokeless tobacco. She reports that she does not drink alcohol and does not use drugs.  Medications  Current Facility-Administered Medications:     stroke: early stages of recovery book, , Does not apply, Once, Howerter, Justin B, DO   acetaminophen (TYLENOL) tablet 650 mg, 650 mg, Oral, Q6H PRN **OR** acetaminophen  (TYLENOL) suppository 650 mg, 650 mg, Rectal, Q6H PRN, Howerter, Justin B, DO   [START ON 12/20/2022] insulin aspart (novoLOG) injection 0-15 Units, 0-15 Units, Subcutaneous, TID WC, Howerter, Justin B, DO   insulin aspart (novoLOG) injection 0-5 Units, 0-5 Units, Subcutaneous, QHS, Howerter, Justin B, DO   insulin glargine-yfgn (SEMGLEE) injection 10 Units, 10 Units, Subcutaneous, QHS, Howerter, Justin B, DO   melatonin tablet 3 mg, 3 mg, Oral, QHS PRN, Howerter, Justin B, DO   ondansetron (ZOFRAN) injection 4 mg, 4 mg, Intravenous, Q6H PRN, Howerter, Justin B, DO  Current Outpatient Medications:    albuterol (VENTOLIN HFA) 108 (90 Base) MCG/ACT inhaler, Inhale 2 puffs into the lungs every 6 (six) hours as needed for wheezing., Disp: , Rfl:    hydrochlorothiazide (HYDRODIURIL) 12.5 MG tablet, Take 12.5 mg by mouth in the morning., Disp: , Rfl:    insulin aspart (NOVOLOG FLEXPEN) 100 UNIT/ML FlexPen, Inject 4 units before meals 3 times daily if blood glucose is greater than 300 (Patient taking differently: Inject 15 Units into the skin 3 (three) times daily with meals.), Disp: 6 mL, Rfl: 11   rosuvastatin (CRESTOR) 20 MG tablet, Take 20 mg by mouth at bedtime., Disp: , Rfl:    TRESIBA FLEXTOUCH 100 UNIT/ML FlexTouch Pen, Inject 16 Units into the skin at bedtime., Disp: , Rfl:    AgaMatrix Ultra-Thin Lancets MISC, Check blood glucose before meals 3 times daily., Disp: 100 each, Rfl: 11   Blood Glucose Monitoring Suppl (AGAMATRIX PRESTO) w/Device KIT, Check blood glucose 3  times daily before meals, Disp: 1 kit, Rfl: 0   fluconazole (DIFLUCAN) 150 MG tablet, 1 tab by mouth daily for 3 days. (Patient not taking: Reported on 03/04/2019), Disp: 3 tablet, Rfl: 0   glucose blood (AGAMATRIX PRESTO TEST) test strip, Check blood glucose 3 times daily before meals, Disp: 100 each, Rfl: 12   Insulin Glargine (LANTUS SOLOSTAR) 100 UNIT/ML Solostar Pen, 50 units injected subcutaneously every morning before breakfast  (Patient not taking: Reported on 12/19/2022), Disp: 5 pen, Rfl: 11   Insulin Syringe-Needle U-100 (INSULIN SYRINGE 1CC/30GX1/2") 30G X 1/2" 1 ML MISC, 1 Units by Does not apply route 4 (four) times daily -  before meals and at bedtime., Disp: 100 each, Rfl: 5   lisinopril (ZESTRIL) 10 MG tablet, 1 tab by mouth daily after out of 5 mg tabs (Patient not taking: Reported on 12/19/2022), Disp: 30 tablet, Rfl: 11   pravastatin (PRAVACHOL) 20 MG tablet, Take 1 tablet (20 mg total) by mouth daily. (Patient not taking: Reported on 11/19/2018), Disp: 30 tablet, Rfl: 2  Exam: Current vital signs: BP 123/76   Pulse 90   Temp 98.1 F (36.7 C) (Oral)   Resp 18   Ht 5\' 2"  (1.575 m)   Wt 90.7 kg   SpO2 100%   BMI 36.58 kg/m  Vital signs in last 24 hours: Temp:  [98.1 F (36.7 C)-98.2 F (36.8 C)] 98.1 F (36.7 C) (08/21 1624) Pulse Rate:  [88-96] 90 (08/21 1945) Resp:  [15-23] 18 (08/21 1945) BP: (123-153)/(76-95) 123/76 (08/21 1945) SpO2:  [98 %-100 %] 100 % (08/21 1945) Weight:  [90.7 kg] 90.7 kg (08/21 1122) General: Awake alert in no distress HEENT: Normocephalic atraumatic Lungs are clear to auscultation Cardiovascular examination reveals regular rate and rhythm Abdomen nontender Neurological exam Awake alert oriented x 3.  No dysarthria No aphasia Cranial nerve examination: Pupils equal round react light, extraocular movements intact, visual fields full, facial sensation intact, face appears grossly symmetric but there is a subtle loss of nasolabial fold on the right at rest.  Smile is symmetric.  Orotracheally intact.  Shoulder shrug intact.  Tongue and palate midline. Motor examination reveals barely antigravity 3/5 strength in both right upper and lower extremity with drift.  Left side is full strength. Sensation intact without extinction Coordination difficult to assess on the right.  Intact on the left. NIHSS 1a Level of Conscious.: 0 1b LOC Questions: 0 1c LOC Commands: 0 2 Best  Gaze: 0 3 Visual: 0 4 Facial Palsy: 1 5a Motor Arm - left: 0 5b Motor Arm - Right: 2 6a Motor Leg - Left: 0 6b Motor Leg - Right: 2 7 Limb Ataxia: 0 8 Sensory: 0 9 Best Language: 0 10 Dysarthria: 0 11 Extinct. and Inatten.: 0 TOTAL: 05   Labs I have reviewed labs in epic and the results pertinent to this consultation are:  CBC    Component Value Date/Time   WBC 13.4 (H) 12/19/2022 1136   RBC 4.44 12/19/2022 1136   HGB 13.6 12/19/2022 1258   HGB 13.0 11/19/2018 1136   HCT 40.0 12/19/2022 1258   HCT 42.7 11/19/2018 1136   PLT 292 12/19/2022 1136   PLT 328 11/19/2018 1136   MCV 90.8 12/19/2022 1136   MCV 93 11/19/2018 1136   MCH 29.3 12/19/2022 1136   MCHC 32.3 12/19/2022 1136   RDW 13.4 12/19/2022 1136   RDW 13.2 11/19/2018 1136   LYMPHSABS 1.3 12/19/2022 1136   LYMPHSABS 3.3 (H) 11/19/2018 1136  MONOABS 0.6 12/19/2022 1136   EOSABS 0.3 12/19/2022 1136   EOSABS 0.4 11/19/2018 1136   BASOSABS 0.1 12/19/2022 1136   BASOSABS 0.1 11/19/2018 1136    CMP     Component Value Date/Time   NA 130 (L) 12/19/2022 1258   NA 132 (L) 03/04/2019 1834   K 4.3 12/19/2022 1258   CL 95 (L) 12/19/2022 1136   CO2 20 (L) 12/19/2022 1136   GLUCOSE 402 (H) 12/19/2022 1136   BUN 9 12/19/2022 1136   BUN 13 03/04/2019 1834   CREATININE 1.00 12/19/2022 1136   CALCIUM 9.2 12/19/2022 1136   PROT 7.1 12/19/2022 1136   PROT 7.1 11/19/2018 1136   ALBUMIN 3.0 (L) 12/19/2022 1136   ALBUMIN 4.0 11/19/2018 1136   AST 22 12/19/2022 1136   ALT 23 12/19/2022 1136   ALKPHOS 94 12/19/2022 1136   BILITOT 0.8 12/19/2022 1136   BILITOT 0.2 11/19/2018 1136   GFRNONAA >60 12/19/2022 1136   GFRAA 102 03/04/2019 1834    Lipid Panel     Component Value Date/Time   CHOL 286 (H) 11/19/2018 1136   TRIG 167 (H) 11/19/2018 1136   HDL 46 11/19/2018 1136   CHOLHDL 7 05/31/2017 1412   VLDL 22.6 05/31/2017 1412   LDLCALC 207 (H) 11/19/2018 1136    Imaging I have reviewed the images  obtained:  CT-head read without acute abnormality but in  hindsight after looking at the MRI, there is probably a hypodensity in the left pons.  MRI brain reveals acute infarction in the left pons.  No intracranial LVO on MRA of the head and neck-1 to 2 mm laterally directed outpouching from the proximal right A2 which may represent tiny aneurysm versus infundibulum.  Assessment:  42 year old past history of hypertension hyperlipidemia diabetes brought in for evaluation of right-sided weakness that has been ongoing for 4 days noted to have a left pontine stroke.  Etiology likely small vessel disease due to uncontrolled risk factors of hypertension hyperlipidemia and diabetes-blood sugars were in the 500 range when she was brought in. On exam she has subtle right facial weakness and right-sided hemiparesis.  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 High intensity statin for goal LDL less than 70 No need for permissive hypertension-symptoms have been ongoing for 4 days CTA head and neck 2D echo A1c Lipid panel PT OT next speech therapy N.p.o. until cleared by bedside swallow evaluation-I see that that has been completed and patient was eating a sandwich when I was in the room. Given her abundant risk factors which can be responsible for the stroke, I do not think a hypercoagulable workup is necessary. I will defer this to the stroke team-can be ordered if they feel otherwise.  Stroke team to follow Luminary plan discussed with EDP Dr. Rubin Payor and final plan was discussed with Dr. Arlean Hopping, admitting hospitalist.  -- Milon Dikes, MD Neurologist [facility] Pager: [phone]

## 2022-12-19 NOTE — ED Notes (Signed)
Pt to MRI via stretcher.

## 2022-12-19 NOTE — ED Notes (Signed)
Phlebotomy at bedside.

## 2022-12-19 NOTE — ED Notes (Signed)
Back from MRI, alert, NAD, calm, interactive.

## 2022-12-19 NOTE — ED Provider Notes (Signed)
[facility] EMERGENCY DEPARTMENT AT [facility] Provider Note   CSN: [id number] Arrival date & time: 12/19/22  1119     History  Chief Complaint  Patient presents with  . Hyperglycemia  . Weakness    Lisa Werner is a 42 y.o. female with T1DM, HTN, HLD who presents BIB EMS from PCP, was being seen for right sided weakness that has been going on for approx 4 days.  This is never happened before.  Not associated with any head trauma or loss of consciousness.  No numbness or tingling anywhere, slurred speech, facial droop, word finding difficulty.  No history of clotting disorder or CVA.  Last known normal was 4 days ago.  She has had trouble getting around at home due to the right sided weakness.  Denies any fever/chills, chest pain, abdominal pain, nausea vomiting. At office pt had episode of nonbloody diarrhea and high blood sugar. Pt states only one additional episode of diarrhea in past 24 hours. CBG with EMS 536 mg/dL. She reports she missed her insulin dose this AM.    Past Medical History:  Diagnosis Date  . Diabetes mellitus without complication (HCC)   . Hyperlipidemia   . Hypertension        Home Medications Prior to Admission medications   Medication Sig Start Date End Date Taking? Authorizing Provider  albuterol (VENTOLIN HFA) 108 (90 Base) MCG/ACT inhaler Inhale 2 puffs into the lungs every 6 (six) hours as needed for wheezing. 11/21/22  Yes [provider]  hydrochlorothiazide (HYDRODIURIL) 12.5 MG tablet Take 12.5 mg by mouth in the morning. 10/25/22  Yes [provider]  insulin aspart (NOVOLOG FLEXPEN) 100 UNIT/ML FlexPen Inject 4 units before meals 3 times daily if blood glucose is greater than 300 Patient taking differently: Inject 15 Units into the skin 3 (three) times daily with meals. 11/19/18  Yes Julieanne Manson, MD  rosuvastatin (CRESTOR) 20 MG tablet Take 20 mg by mouth at bedtime. 11/05/22  Yes [provider]   TRESIBA FLEXTOUCH 100 UNIT/ML FlexTouch Pen Inject 16 Units into the skin at bedtime. 04/24/22  Yes [provider]  AgaMatrix Ultra-Thin Lancets MISC Check blood glucose before meals 3 times daily. 11/19/18   Julieanne Manson, MD  Blood Glucose Monitoring Suppl (AGAMATRIX PRESTO) w/Device KIT Check blood glucose 3 times daily before meals 11/19/18   Julieanne Manson, MD  fluconazole (DIFLUCAN) 150 MG tablet 1 tab by mouth daily for 3 days. Patient not taking: Reported on 03/04/2019 11/19/18   Julieanne Manson, MD  glucose blood (AGAMATRIX PRESTO TEST) test strip Check blood glucose 3 times daily before meals 11/19/18   Julieanne Manson, MD  Insulin Glargine (LANTUS SOLOSTAR) 100 UNIT/ML Solostar Pen 50 units injected subcutaneously every morning before breakfast Patient not taking: Reported on 12/19/2022 11/19/18   Julieanne Manson, MD  Insulin Syringe-Needle U-100 (INSULIN SYRINGE 1CC/30GX1/2") 30G X 1/2" 1 ML MISC 1 Units by Does not apply route 4 (four) times daily -  before meals and at bedtime. 07/24/17   Nche, Bonna Gains, NP  lisinopril (ZESTRIL) 10 MG tablet 1 tab by mouth daily after out of 5 mg tabs Patient not taking: Reported on 12/19/2022 03/04/19   Julieanne Manson, MD  pravastatin (PRAVACHOL) 20 MG tablet Take 1 tablet (20 mg total) by mouth daily. Patient not taking: Reported on 11/19/2018 07/24/17   Nche, Bonna Gains, NP      Allergies    Pollen extract    Review of Systems  Review of Systems A 10 point review of systems was performed and is negative unless otherwise reported in HPI.  Physical Exam Updated Vital Signs BP (!) 136/95   Pulse 91   Temp 98.2 F (36.8 C) (Oral)   Resp 15   Ht 5\' 2"  (1.575 m)   Wt 90.7 kg   SpO2 100%   BMI 36.58 kg/m  Physical Exam General: Normal appearing female, lying in bed.  HEENT: PERRLA, EOMI, no nystagmus, Sclera anicteric, MMM, trachea midline. Tongue protrudes midline. Normal speech.  Cardiology: RRR,  no murmurs/rubs/gallops. BL radial and DP pulses equal bilaterally.  Resp: Normal respiratory rate and effort. CTAB, no wheezes, rhonchi, crackles.  Abd: Soft, non-tender, non-distended. No rebound tenderness or guarding.  GU: Deferred. MSK: No peripheral edema or signs of trauma. Extremities without deformity or TTP.  Skin: warm, dry.  Back: No CVA tenderness Neuro: A&Ox4, CNs II-XII grossly intact. 3/5 strength in RUE/RLE. 5/5 strength in LUE/LLE. Sensation grossly intact.  Psych: Normal mood and affect.   1a  Level of consciousness: 0=alert; keenly responsive  1b. LOC questions:  0=Performs both tasks correctly  1c. LOC commands: 0=Performs both tasks correctly  2.  Best Gaze: 0=normal  3.  Visual: 0=No visual loss  4. Facial Palsy: 0=Normal symmetric movement  5a.  Motor left arm: 0=No drift, limb holds 90 (or 45) degrees for full 10 seconds  5b.  Motor right arm: 2=Some effort against gravity, limb cannot get to or maintain (if cured) 90 (or 45) degrees, drifts down to bed, but has some effort against gravity  6a. motor left leg: 0=No drift, limb holds 90 (or 45) degrees for full 10 seconds  6b  Motor right leg:  2=Some effort against gravity, limb cannot get to or maintain (if cured) 90 (or 45) degrees, drifts down to bed, but has some effort against gravity  7. Limb Ataxia: 0=Absent  8.  Sensory: 0=Normal; no sensory loss  9. Best Language:  0=No aphasia, normal  10. Dysarthria: 0=Normal  11. Extinction and Inattention: 0=No abnormality   Total:   4        ED Results / Procedures / Treatments   Labs (all labs ordered are listed, but only abnormal results are displayed) Labs Reviewed  CBC WITH DIFFERENTIAL/PLATELET - Abnormal; Notable for the following components:      Result Value   WBC 13.4 (*)    Neutro Abs 11.0 (*)    Abs Immature Granulocytes 0.08 (*)    All other components within normal limits  COMPREHENSIVE METABOLIC PANEL - Abnormal; Notable for the following  components:   Sodium 131 (*)    Chloride 95 (*)    CO2 20 (*)    Glucose, Bld 402 (*)    Albumin 3.0 (*)    Anion gap 16 (*)    All other components within normal limits  URINALYSIS, ROUTINE W REFLEX MICROSCOPIC - Abnormal; Notable for the following components:   Color, Urine YELLOW (*)    APPearance CLOUDY (*)    Glucose, UA >=500 (*)    Hgb urine dipstick MODERATE (*)    Ketones, ur 5 (*)    Protein, ur 30 (*)    Bacteria, UA RARE (*)    All other components within normal limits  CBG MONITORING, ED - Abnormal; Notable for the following components:   Glucose-Capillary 416 (*)    All other components within normal limits  I-STAT VENOUS BLOOD GAS, ED - Abnormal; Notable for the following  components:   pCO2, Ven 34.4 (*)    TCO2 21 (*)    Acid-base deficit 4.0 (*)    Sodium 130 (*)    Calcium, Ion 1.11 (*)    All other components within normal limits  RAPID URINE DRUG SCREEN, HOSP PERFORMED  ETHANOL  MAGNESIUM  BETA-HYDROXYBUTYRIC ACID  HCG, QUANTITATIVE, PREGNANCY    EKG None  Radiology CT Head Wo Contrast  Result Date: 12/19/2022 CLINICAL DATA:  Hyperglycemia and weakness. EXAM: CT HEAD WITHOUT CONTRAST TECHNIQUE: Contiguous axial images were obtained from the base of the skull through the vertex without intravenous contrast. RADIATION DOSE REDUCTION: This exam was performed according to the departmental dose-optimization program which includes automated exposure control, adjustment of the mA and/or kV according to patient size and/or use of iterative reconstruction technique. COMPARISON:  None Available. FINDINGS: Brain: No evidence of acute infarction, hemorrhage, hydrocephalus, extra-axial collection or mass lesion/mass effect. Vascular: The basilar artery measures approximately 6 mm in diameter and is mildly calcified and tortuous in appearance. Skull: Normal. Negative for fracture or focal lesion. Sinuses/Orbits: No acute finding. Other: None. IMPRESSION: 1. No acute  intracranial abnormality. 2. 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 Electronically Signed   By: Aram Candela M.D.   On: 12/19/2022 15:15    Procedures Procedures    Medications Ordered in ED Medications  lactated ringers bolus 1,000 mL (1,000 mLs Intravenous New Bag/Given 12/19/22 1540)  insulin aspart (novoLOG) injection 15 Units (15 Units Subcutaneous Given 12/19/22 1528)    ED Course/ Medical Decision Making/ A&P                          Medical Decision Making Amount and/or Complexity of Data Reviewed Labs: ordered. Decision-making details documented in ED Course. Radiology: ordered.  Risk Prescription drug management.    This patient presents to the ED for concern of right sided weakness, hyperglycemia, this involves an extensive number of treatment options, and is a complaint that carries with it a high risk of complications and morbidity.  I considered the following differential and admission for this acute, potentially life threatening condition.   MDM:    Given the acute onset of neurological symptoms, stroke is the most concerning etiology of these acute symptoms. The neuro exam is significant for RUE/RLE weakness. NIHSS 4. Also consider her hyperglycemia or DKA, electrolyte derangements, ICH though no headache as possible etiologies.  Plan to obtain emergent CT brain. Per chart review, patient takes 15 U aspart SQ TID, will give 1L LR and 15 U aspart SQ and reassess.   LKN: 4 days ago Glucose: 536 mg/dL AC: No BP: 119/14  Clinical Course as of 12/19/22 1610  Wed Dec 19, 2022  1134 Glucose-Capillary(!): 416 [HN]  1432 Alcohol, Ethyl (B): <10 neg [HN]  1432 Beta-Hydroxybutyric Acid: 0.14 Neg, likely no DKA [HN]  1432 pH, Ven: 7.379 No acidosis [HN]  1432 WBC(!): 13.4 +Leukocytosis [HN]  1452 Glucose(!): 402 [HN]  1453 Consulted to neurology. Ordered MRI brain wo contrast. [HN]  1531 D/w neurology Dr. Otelia Limes who  recommends MRA head without contrast based on [facility] read as well as MRI head. Recommends re-engaging with them once MRI has been completed. Patient is signed out to the oncoming ED physician Dr. Rubin Payor who is made aware of her history, presentation, exam, workup, and plan (MRI, MRA, repeat glucose).  [HN]  1609 Glucose-Capillary(!): 239 Improved glucose [HN]    Clinical Course User Index [HN]  Loetta Rough, MD    Labs: I Ordered, and personally interpreted labs.  The pertinent results include:  those listed above  Imaging Studies ordered: I ordered imaging studies including CTH, MRI brain wo contrast I independently visualized and interpreted imaging. I agree with the radiologist interpretation  Additional history obtained from son at bedside.    Reevaluation: After the interventions noted above, I reevaluated the patient and found that they have :stayed the same  Social Determinants of Health: .Lives independently  Disposition:  Signed out pending MRA/MRI, repeat glucose  Co morbidities that complicate the patient evaluation . Past Medical History:  Diagnosis Date  . Diabetes mellitus without complication (HCC)   . Hyperlipidemia   . Hypertension      Medicines Meds ordered this encounter  Medications  . lactated ringers bolus 1,000 mL  . DISCONTD: insulin aspart (novoLOG) injection 5 Units  . insulin aspart (novoLOG) injection 15 Units    I have reviewed the patients home medicines and have made adjustments as needed  Problem List / ED Course: Problem List Items Addressed This Visit   None Visit Diagnoses     Acute right-sided weakness    -  Primary   Hyperglycemia                       This note was created using dictation software, which may contain spelling or grammatical errors.    Loetta Rough, MD 12/19/22 1600

## 2022-12-19 NOTE — ED Provider Notes (Signed)
  Physical Exam  BP (!) 127/93   Pulse 91   Temp 98.1 F (36.7 C) (Oral)   Resp 18   Ht 5\' 2"  (1.575 m)   Wt 90.7 kg   SpO2 100%   BMI 36.58 kg/m   Physical Exam  Procedures  Procedures  ED Course / MDM   Clinical Course as of 12/19/22 1934  Wed Dec 19, 2022  1134 Glucose-Capillary(!): 416 [HN]  1432 Alcohol, Ethyl (B): <10 neg [HN]  1432 Beta-Hydroxybutyric Acid: 0.14 Neg, likely no DKA [HN]  1432 pH, Ven: 7.379 No acidosis [HN]  1432 WBC(!): 13.4 +Leukocytosis [HN]  1452 Glucose(!): 402 [HN]  1453 Consulted to neurology. Ordered MRI brain wo contrast. [HN]  1531 D/w neurology Dr. Otelia Limes who recommends MRA head without contrast based on [facility] read as well as MRI head. Recommends re-engaging with them once MRI has been completed. Patient is signed out to the oncoming ED physician Dr. Rubin Payor who is made aware of her history, presentation, exam, workup, and plan (MRI, MRA, repeat glucose).  [HN]  1609 Glucose-Capillary(!): 239 Improved glucose [HN]    Clinical Course User Index [HN] Loetta Rough, MD   Medical Decision Making Amount and/or Complexity of Data Reviewed Labs: ordered. Decision-making details documented in ED Course. Radiology: ordered.  Risk Prescription drug management. Decision regarding hospitalization.   Patient with right-sided weakness.  Had for 4 days.  Received in signout.  Discussed with Dr. Jerrell Belfast from neurology.  MRI shows brainstem stroke.  Will require admission to hospital.  Will discuss with unassigned medicine.  Not a TNK candidate due to time of onset of 4 days ago.       Benjiman Core, MD 12/19/22 [phone]

## 2022-12-20 ENCOUNTER — Encounter (HOSPITAL_COMMUNITY): Payer: Self-pay | Admitting: Internal Medicine

## 2022-12-20 ENCOUNTER — Other Ambulatory Visit: Payer: Self-pay

## 2022-12-20 ENCOUNTER — Observation Stay (HOSPITAL_COMMUNITY): Payer: BC Managed Care – PPO

## 2022-12-20 DIAGNOSIS — I639 Cerebral infarction, unspecified: Secondary | ICD-10-CM | POA: Diagnosis present

## 2022-12-20 DIAGNOSIS — Z6839 Body mass index (BMI) 39.0-39.9, adult: Secondary | ICD-10-CM | POA: Diagnosis not present

## 2022-12-20 DIAGNOSIS — E1165 Type 2 diabetes mellitus with hyperglycemia: Secondary | ICD-10-CM | POA: Diagnosis present

## 2022-12-20 DIAGNOSIS — I69351 Hemiplegia and hemiparesis following cerebral infarction affecting right dominant side: Secondary | ICD-10-CM | POA: Diagnosis not present

## 2022-12-20 DIAGNOSIS — K5900 Constipation, unspecified: Secondary | ICD-10-CM | POA: Diagnosis present

## 2022-12-20 DIAGNOSIS — E119 Type 2 diabetes mellitus without complications: Secondary | ICD-10-CM | POA: Diagnosis not present

## 2022-12-20 DIAGNOSIS — Z8261 Family history of arthritis: Secondary | ICD-10-CM | POA: Diagnosis not present

## 2022-12-20 DIAGNOSIS — E860 Dehydration: Secondary | ICD-10-CM | POA: Diagnosis present

## 2022-12-20 DIAGNOSIS — R531 Weakness: Secondary | ICD-10-CM | POA: Diagnosis not present

## 2022-12-20 DIAGNOSIS — I6389 Other cerebral infarction: Secondary | ICD-10-CM | POA: Diagnosis not present

## 2022-12-20 DIAGNOSIS — R29810 Facial weakness: Secondary | ICD-10-CM | POA: Diagnosis present

## 2022-12-20 DIAGNOSIS — E11649 Type 2 diabetes mellitus with hypoglycemia without coma: Secondary | ICD-10-CM | POA: Diagnosis not present

## 2022-12-20 DIAGNOSIS — Z794 Long term (current) use of insulin: Secondary | ICD-10-CM | POA: Diagnosis not present

## 2022-12-20 DIAGNOSIS — R29704 NIHSS score 4: Secondary | ICD-10-CM | POA: Diagnosis present

## 2022-12-20 DIAGNOSIS — Z7985 Long-term (current) use of injectable non-insulin antidiabetic drugs: Secondary | ICD-10-CM | POA: Diagnosis not present

## 2022-12-20 DIAGNOSIS — D6851 Activated protein C resistance: Secondary | ICD-10-CM | POA: Diagnosis present

## 2022-12-20 DIAGNOSIS — E871 Hypo-osmolality and hyponatremia: Secondary | ICD-10-CM | POA: Diagnosis present

## 2022-12-20 DIAGNOSIS — Z803 Family history of malignant neoplasm of breast: Secondary | ICD-10-CM | POA: Diagnosis not present

## 2022-12-20 DIAGNOSIS — Z833 Family history of diabetes mellitus: Secondary | ICD-10-CM | POA: Diagnosis not present

## 2022-12-20 DIAGNOSIS — F329 Major depressive disorder, single episode, unspecified: Secondary | ICD-10-CM | POA: Diagnosis not present

## 2022-12-20 DIAGNOSIS — Z841 Family history of disorders of kidney and ureter: Secondary | ICD-10-CM | POA: Diagnosis not present

## 2022-12-20 DIAGNOSIS — D6852 Prothrombin gene mutation: Secondary | ICD-10-CM | POA: Diagnosis present

## 2022-12-20 DIAGNOSIS — E139 Other specified diabetes mellitus without complications: Secondary | ICD-10-CM | POA: Diagnosis not present

## 2022-12-20 DIAGNOSIS — I10 Essential (primary) hypertension: Secondary | ICD-10-CM | POA: Diagnosis present

## 2022-12-20 DIAGNOSIS — Z825 Family history of asthma and other chronic lower respiratory diseases: Secondary | ICD-10-CM | POA: Diagnosis not present

## 2022-12-20 DIAGNOSIS — E782 Mixed hyperlipidemia: Secondary | ICD-10-CM | POA: Diagnosis present

## 2022-12-20 DIAGNOSIS — D72829 Elevated white blood cell count, unspecified: Secondary | ICD-10-CM | POA: Diagnosis present

## 2022-12-20 DIAGNOSIS — I6329 Cerebral infarction due to unspecified occlusion or stenosis of other precerebral arteries: Secondary | ICD-10-CM | POA: Diagnosis present

## 2022-12-20 DIAGNOSIS — Z79899 Other long term (current) drug therapy: Secondary | ICD-10-CM | POA: Diagnosis not present

## 2022-12-20 DIAGNOSIS — G8191 Hemiplegia, unspecified affecting right dominant side: Secondary | ICD-10-CM | POA: Diagnosis present

## 2022-12-20 DIAGNOSIS — R739 Hyperglycemia, unspecified: Secondary | ICD-10-CM | POA: Diagnosis not present

## 2022-12-20 LAB — CBC WITH DIFFERENTIAL/PLATELET
Abs Immature Granulocytes: 0.05 10*3/uL (ref 0.00–0.07)
Basophils Absolute: 0.1 10*3/uL (ref 0.0–0.1)
Basophils Relative: 1 %
Eosinophils Absolute: 0.4 10*3/uL (ref 0.0–0.5)
Eosinophils Relative: 4 %
HCT: 41.8 % (ref 36.0–46.0)
Hemoglobin: 13.2 g/dL (ref 12.0–15.0)
Immature Granulocytes: 0 %
Lymphocytes Relative: 24 %
Lymphs Abs: 2.8 10*3/uL (ref 0.7–4.0)
MCH: 28.3 pg (ref 26.0–34.0)
MCHC: 31.6 g/dL (ref 30.0–36.0)
MCV: 89.5 fL (ref 80.0–100.0)
Monocytes Absolute: 0.7 10*3/uL (ref 0.1–1.0)
Monocytes Relative: 6 %
Neutro Abs: 7.6 10*3/uL (ref 1.7–7.7)
Neutrophils Relative %: 65 %
Platelets: 295 10*3/uL (ref 150–400)
RBC: 4.67 MIL/uL (ref 3.87–5.11)
RDW: 13.5 % (ref 11.5–15.5)
WBC: 11.6 10*3/uL — ABNORMAL HIGH (ref 4.0–10.5)
nRBC: 0 % (ref 0.0–0.2)

## 2022-12-20 LAB — COMPREHENSIVE METABOLIC PANEL
ALT: 21 U/L (ref 0–44)
AST: 24 U/L (ref 15–41)
Albumin: 2.8 g/dL — ABNORMAL LOW (ref 3.5–5.0)
Alkaline Phosphatase: 87 U/L (ref 38–126)
Anion gap: 11 (ref 5–15)
BUN: 7 mg/dL (ref 6–20)
CO2: 21 mmol/L — ABNORMAL LOW (ref 22–32)
Calcium: 8.8 mg/dL — ABNORMAL LOW (ref 8.9–10.3)
Chloride: 101 mmol/L (ref 98–111)
Creatinine, Ser: 0.8 mg/dL (ref 0.44–1.00)
GFR, Estimated: >60 mL/min (ref >60)
Glucose, Bld: 279 mg/dL — ABNORMAL HIGH (ref 70–99)
Potassium: 3.5 mmol/L (ref 3.5–5.1)
Sodium: 133 mmol/L — ABNORMAL LOW (ref 135–145)
Total Bilirubin: 0.7 mg/dL (ref 0.3–1.2)
Total Protein: 7.1 g/dL (ref 6.5–8.1)

## 2022-12-20 LAB — ECHOCARDIOGRAM COMPLETE BUBBLE STUDY
Area-P 1/2: 4.54 cm2
Calc EF: 70.2 %
S' Lateral: 2.1 cm
Single Plane A2C EF: 71.1 %
Single Plane A4C EF: 72.3 %

## 2022-12-20 LAB — MAGNESIUM: Magnesium: 1.9 mg/dL (ref 1.7–2.4)

## 2022-12-20 LAB — LIPID PANEL
Cholesterol: 265 mg/dL — ABNORMAL HIGH (ref 0–200)
HDL: 36 mg/dL — ABNORMAL LOW (ref >40)
LDL Cholesterol: 180 mg/dL — ABNORMAL HIGH (ref 0–99)
Total CHOL/HDL Ratio: 7.4 RATIO
Triglycerides: 245 mg/dL — ABNORMAL HIGH (ref <150)
VLDL: 49 mg/dL — ABNORMAL HIGH (ref 0–40)

## 2022-12-20 LAB — CBG MONITORING, ED: Glucose-Capillary: 308 mg/dL — ABNORMAL HIGH (ref 70–99)

## 2022-12-20 LAB — GLUCOSE, CAPILLARY
Glucose-Capillary: 205 mg/dL — ABNORMAL HIGH (ref 70–99)
Glucose-Capillary: 250 mg/dL — ABNORMAL HIGH (ref 70–99)
Glucose-Capillary: 223 mg/dL — ABNORMAL HIGH (ref 70–99)

## 2022-12-20 LAB — HCG, QUANTITATIVE, PREGNANCY: hCG, Beta Chain, Quant, S: <1 m[IU]/mL (ref <5)

## 2022-12-20 MED ORDER — INSULIN GLARGINE-YFGN 100 UNIT/ML ~~LOC~~ SOLN
30.0000 [IU] | Freq: Every day | SUBCUTANEOUS | Status: DC
Start: 2022-12-20 — End: 2022-12-20

## 2022-12-20 MED ORDER — INSULIN ASPART 100 UNIT/ML IJ SOLN
15.0000 [IU] | Freq: Three times a day (TID) | INTRAMUSCULAR | Status: DC
  Administered 2022-12-20 – 2022-12-24 (×13): 15 [IU] via SUBCUTANEOUS

## 2022-12-20 MED ORDER — ASPIRIN 81 MG PO TBEC
81.0000 mg | DELAYED_RELEASE_TABLET | Freq: Every day | ORAL | Status: DC
  Administered 2022-12-20 – 2022-12-24 (×5): 81 mg via ORAL
  Filled 2022-12-20 (×5): qty 1

## 2022-12-20 MED ORDER — ROSUVASTATIN CALCIUM 20 MG PO TABS
40.0000 mg | ORAL_TABLET | Freq: Every day | ORAL | Status: DC
  Administered 2022-12-20 – 2022-12-24 (×5): 40 mg via ORAL
  Filled 2022-12-20 (×5): qty 2

## 2022-12-20 MED ORDER — CLOPIDOGREL BISULFATE 75 MG PO TABS
75.0000 mg | ORAL_TABLET | Freq: Every day | ORAL | Status: DC
  Administered 2022-12-20 – 2022-12-24 (×5): 75 mg via ORAL
  Filled 2022-12-20 (×5): qty 1

## 2022-12-20 MED ORDER — INSULIN GLARGINE-YFGN 100 UNIT/ML ~~LOC~~ SOLN
20.0000 [IU] | Freq: Every day | SUBCUTANEOUS | Status: DC
  Administered 2022-12-20 – 2022-12-23 (×4): 20 [IU] via SUBCUTANEOUS
  Filled 2022-12-20 (×5): qty 0.2

## 2022-12-20 NOTE — Progress Notes (Addendum)
STROKE TEAM PROGRESS NOTE   BRIEF HPI Ms. Lisa Werner is a 42 y.o. female with history of hypertension, hyperlipidemia and diabetes presenting with right-sided weakness since Saturday 8/17.  She was found to be hypoglycemic with capillary blood glucose of 536 on admission.  Patient states that she does not check her blood sugar at home and stopped wearing her glucose monitor because it beeped too much.  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  While she does have several apparent risk factors for stroke, considering her age, she will need workup of stroke in young patient.   SIGNIFICANT HOSPITAL EVENTS   INTERIM HISTORY/SUBJECTIVE Patient is hemodynamically stable but has remained hyperglycemic.  Will place diabetes coordinator consult.   OBJECTIVE  CBC    Component Value Date/Time   WBC 11.6 (H) 12/20/2022 0133   RBC 4.67 12/20/2022 0133   HGB 13.2 12/20/2022 0133   HGB 13.0 11/19/2018 1136   HCT 41.8 12/20/2022 0133   HCT 42.7 11/19/2018 1136   PLT 295 12/20/2022 0133   PLT 328 11/19/2018 1136   MCV 89.5 12/20/2022 0133   MCV 93 11/19/2018 1136   MCH 28.3 12/20/2022 0133   MCHC 31.6 12/20/2022 0133   RDW 13.5 12/20/2022 0133   RDW 13.2 11/19/2018 1136   LYMPHSABS 2.8 12/20/2022 0133   LYMPHSABS 3.3 (H) 11/19/2018 1136   MONOABS 0.7 12/20/2022 0133   EOSABS 0.4 12/20/2022 0133   EOSABS 0.4 11/19/2018 1136   BASOSABS 0.1 12/20/2022 0133   BASOSABS 0.1 11/19/2018 1136    BMET    Component Value Date/Time   NA 133 (L) 12/20/2022 0133   NA 132 (L) 03/04/2019 1834   K 3.5 12/20/2022 0133   CL 101 12/20/2022 0133   CO2 21 (L) 12/20/2022 0133   GLUCOSE 279 (H) 12/20/2022 0133   BUN 7 12/20/2022 0133   BUN 13 03/04/2019 1834   CREATININE 0.80 12/20/2022 0133   CALCIUM 8.8 (L) 12/20/2022 0133   GFRNONAA >60 12/20/2022 0133    IMAGING past 24 hours ECHOCARDIOGRAM COMPLETE BUBBLE STUDY  Result Date: 12/20/2022    ECHOCARDIOGRAM REPORT    Patient Name:   Lisa Werner Date of Exam: 12/20/2022 Medical Rec #:  [id number]               Height:       62.0 in Accession #:    [id number]              Weight:       200.0 lb Date of Birth:  [birth year]              BSA:          1.912 m Patient Age:    41 years                BP:           110/76 mmHg Patient Gender: F                       HR:           88 bpm. Exam Location:  Inpatient Procedure: 2D Echo, Cardiac Doppler, Color Doppler and Strain Analysis Indications:    Stroke  History:        Patient has no prior history of Echocardiogram examinations.                 Risk Factors:Hypertension, Diabetes and Dyslipidemia.  Sonographer:  Sheralyn Boatman RDCS Referring Phys: [id number] Angie Fava  Sonographer Comments: Patient is obese. Image acquisition challenging due to patient body habitus. IMPRESSIONS  1. Left ventricular ejection fraction, by estimation, is 65 to 70%. The left ventricle has normal function. The left ventricle has no regional wall motion abnormalities. There is moderate left ventricular hypertrophy. Left ventricular diastolic parameters were normal. The average left ventricular global longitudinal strain is -22.0 %. The global longitudinal strain is normal.  2. Right ventricular systolic function is normal. The right ventricular size is normal.  3. The mitral valve is normal in structure. Trivial mitral valve regurgitation. No evidence of mitral stenosis.  4. The aortic valve is tricuspid. Aortic valve regurgitation is not visualized. No aortic stenosis is present.  5. Agitated saline contrast bubble study was negative, with no evidence of any interatrial shunt. Comparison(s): No prior Echocardiogram. Conclusion(s)/Recommendation(s): Otherwise normal echocardiogram, with minor abnormalities described in the report. FINDINGS  Left Ventricle: Left ventricular ejection fraction, by estimation, is 65 to 70%. The left ventricle has normal function. The left ventricle has no regional  wall motion abnormalities. The average left ventricular global longitudinal strain is -22.0 %. The global longitudinal strain is normal. The left ventricular internal cavity size was small. There is moderate left ventricular hypertrophy. Left ventricular diastolic parameters were normal. Right Ventricle: The right ventricular size is normal. No increase in right ventricular wall thickness. Right ventricular systolic function is normal. Left Atrium: Left atrial size was normal in size. Right Atrium: Right atrial size was normal in size. Pericardium: There is no evidence of pericardial effusion. Mitral Valve: The mitral valve is normal in structure. Trivial mitral valve regurgitation. No evidence of mitral valve stenosis. Tricuspid Valve: The tricuspid valve is normal in structure. Tricuspid valve regurgitation is trivial. No evidence of tricuspid stenosis. Aortic Valve: The aortic valve is tricuspid. Aortic valve regurgitation is not visualized. No aortic stenosis is present. Pulmonic Valve: The pulmonic valve was grossly normal. Pulmonic valve regurgitation is not visualized. No evidence of pulmonic stenosis. Aorta: The aortic root, ascending aorta, aortic arch and descending aorta are all structurally normal, with no evidence of dilitation or obstruction. Venous: The inferior vena cava was not well visualized. IAS/Shunts: No atrial level shunt detected by color flow Doppler. Agitated saline contrast was given intravenously to evaluate for intracardiac shunting. Agitated saline contrast bubble study was negative, with no evidence of any interatrial shunt.  LEFT VENTRICLE PLAX 2D LVIDd:         3.35 cm     Diastology LVIDs:         2.10 cm     LV e' medial:    5.55 cm/s LV PW:         1.55 cm     LV E/e' medial:  12.4 LV IVS:        1.55 cm     LV e' lateral:   5.87 cm/s LVOT diam:     2.30 cm     LV E/e' lateral: 11.7 LV SV:         68 LV SV Index:   35          2D Longitudinal Strain LVOT Area:     4.15 cm    2D  Strain GLS (A2C):   -24.5 %                            2D Strain GLS (A3C):   -17.6 %  2D Strain GLS (A4C):   -23.9 % LV Volumes (MOD)           2D Strain GLS Avg:     -22.0 % LV vol d, MOD A2C: 58.1 ml LV vol d, MOD A4C: 60.2 ml LV vol s, MOD A2C: 16.8 ml LV vol s, MOD A4C: 16.7 ml LV SV MOD A2C:     41.3 ml LV SV MOD A4C:     60.2 ml LV SV MOD BP:      42.3 ml RIGHT VENTRICLE            IVC RV S prime:     8.92 cm/s  IVC diam: 2.00 cm TAPSE (M-mode): 1.5 cm LEFT ATRIUM             Index        RIGHT ATRIUM           Index LA diam:        3.50 cm 1.83 cm/m   RA Area:     11.50 cm LA Vol (A2C):   28.3 ml 14.80 ml/m  RA Volume:   22.80 ml  11.93 ml/m LA Vol (A4C):   32.2 ml 16.84 ml/m LA Biplane Vol: 30.3 ml 15.85 ml/m  AORTIC VALVE LVOT Vmax:   88.10 cm/s LVOT Vmean:  60.200 cm/s LVOT VTI:    0.163 m  AORTA Ao Root diam: 3.00 cm Ao Asc diam:  2.80 cm MITRAL VALVE MV Area (PHT): 4.54 cm    SHUNTS MV Decel Time: 167 msec    Systemic VTI:  0.16 m MV E velocity: 68.55 cm/s  Systemic Diam: 2.30 cm MV A velocity: 61.70 cm/s MV E/A ratio:  1.11 Jodelle Red MD Electronically signed by Jodelle Red MD Signature Date/Time: 12/20/2022/12:03:25 PM    Final    CT ANGIO HEAD NECK W WO CM  Result Date: 12/19/2022 CLINICAL DATA:  Stroke on MRI, determine embolic source EXAM: CT ANGIOGRAPHY HEAD AND NECK WITH AND WITHOUT CONTRAST TECHNIQUE: Multidetector CT imaging of the head and neck was performed using the standard protocol during bolus administration of intravenous contrast. Multiplanar CT image reconstructions and MIPs were obtained to evaluate the vascular anatomy. Carotid stenosis measurements (when applicable) are obtained utilizing NASCET criteria, using the distal internal carotid diameter as the denominator. RADIATION DOSE REDUCTION: This exam was performed according to the departmental dose-optimization program which includes automated exposure control, adjustment of  the mA and/or kV according to patient size and/or use of iterative reconstruction technique. CONTRAST:  75mL OMNIPAQUE IOHEXOL 350 MG/ML SOLN COMPARISON:  12/19/2022 CT head, MRI head, and MRA head, no prior CTA FINDINGS: CT HEAD FINDINGS For noncontrast findings, please see same day CT head. CTA NECK FINDINGS Aortic arch: Two-vessel arch with a common origin of the brachiocephalic and left common carotid arteries. Imaged portion shows no evidence of aneurysm or dissection. No significant stenosis of the major arch vessel origins. Right carotid system: No evidence of dissection, occlusion, or hemodynamically significant stenosis (greater than 50%). Atherosclerotic disease at the bifurcation and in the proximal ICA is not hemodynamically significant. Left carotid system: No evidence of dissection, occlusion, or hemodynamically significant stenosis (greater than 50%). Atherosclerotic disease at the bifurcation and in the proximal ICA is not hemodynamically significant. Vertebral arteries: Right dominant system. The left vertebral artery is patent to the skull base, with areas of caliber change in the proximal left V2 segment (series 7, image 241 an series 9, image 109 and 113), concerning for dissection.  The right vertebral artery is dominant and patent to the skull base without evidence of dissection or significant stenosis. Skeleton: No acute osseous abnormality. Degenerative changes in the cervical spine. Other neck: No acute finding. Upper chest: No focal pulmonary opacity or pleural effusion. Review of the MIP images confirms the above findings CTA HEAD FINDINGS Anterior circulation: Both internal carotid arteries are patent to the termini, with mild stenosis in the distal right supraclinoid ICA (series 7, image 107 A1 segments patent. Normal anterior communicating artery. Anterior cerebral arteries are patent to their distal aspects without significant stenosis. No M1 stenosis or occlusion. MCA branches perfused  to their distal aspects without significant stenosis. Posterior circulation: Vertebral arteries patent to the vertebrobasilar junction without significant stenosis. Posterior inferior cerebellar arteries patent proximally. Basilar patent to its distal aspect without significant stenosis. Superior cerebellar arteries patent proximally. Patent P1 segments. PCAs perfused to their distal aspects without significant stenosis. The right posterior communicating artery is patent. Venous sinuses: As permitted by contrast timing, patent. Anatomic variants: None significant. Review of the MIP images confirms the above findings IMPRESSION: 1. Areas of caliber change in the proximal left V2 segment, concerning for dissection. 2. No intracranial large vessel occlusion. Mild stenosis in the distal right supraclinoid ICA. 3. No hemodynamically significant stenosis in the neck. Imaging results were communicated on 12/19/2022 at 11:47 pm to provider Dr. Wilford Corner via secure text paging. Electronically Signed   By: Wiliam Ke M.D.   On: 12/19/2022 23:48   MR BRAIN WO CONTRAST  Result Date: 12/19/2022 CLINICAL DATA:  Stroke suspected EXAM: MRI HEAD WITHOUT CONTRAST MRA HEAD WITHOUT CONTRAST TECHNIQUE: Multiplanar, multi-echo pulse sequences of the brain and surrounding structures were acquired without intravenous contrast. Angiographic images of the Circle of Willis were acquired using MRA technique without intravenous contrast. COMPARISON:  No prior MRI available, correlation is made with CT head 12/19/2022 FINDINGS: MRI HEAD FINDINGS Brain: Restricted diffusion with ADC correlate in the left pons (series 5, images 73-76), which measures up to 1.5 x 1.2 x 0.9 cm. This area is associated with increased T2 hyperintense signal, likely cytotoxic edema. No acute hemorrhage, mass, mass effect, or midline shift. No hydrocephalus or extra-axial collection. Normal pituitary and craniocervical junction. Hemosiderin deposition in the right  thalamus and cerebral peduncle, likely sequela of prior hypertensive microhemorrhage. Vascular: Please see MRA findings below. Skull and upper cervical spine: Normal marrow signal. Sinuses/Orbits: Mucosal thickening in the ethmoid air cells. No acute finding in the orbits. Other: The mastoid air cells are well aerated. MRA HEAD FINDINGS Anterior circulation: Both internal carotid arteries are patent to the termini, without significant stenosis. A1 segments patent. Normal anterior communicating artery. Anterior cerebral arteries are patent to their distal aspects without significant stenosis. 1-2 mm laterally directed outpouching from the proximal right A2 (series 9, image 111), which may represent a tiny aneurysm versus an infundibulum. No M1 stenosis or occlusion. Distal MCA branches perfused to their distal aspects without significant stenosis. Posterior circulation: Right dominant system, with relatively large right vertebral and basilar arteries, which are otherwise unremarkable; no evidence of aneurysmal dilatation or significant tortuosity. Vertebral arteries patent to the vertebrobasilar junction without stenosis. Basilar patent to its distal aspect. Superior cerebellar arteries patent proximally. Patent P1 segments. PCAs perfused to their distal aspects without significant stenosis. The right posterior communicating artery is patent. Anatomic variants: None significant IMPRESSION: 1. Acute infarct in the left pons. 2. No intracranial large vessel occlusion or significant stenosis. 3. 1-2 mm laterally directed outpouching  from the proximal right A2, which may represent a tiny aneurysm versus an infundibulum. These results were called by telephone at the time of interpretation on 12/19/2022 at 8:25 pm to provider [facility], who verbally acknowledged these results. Electronically Signed   By: Wiliam Ke M.D.   On: 12/19/2022 20:25   MR ANGIO HEAD WO CONTRAST  Result Date: 12/19/2022 CLINICAL DATA:  Stroke  suspected EXAM: MRI HEAD WITHOUT CONTRAST MRA HEAD WITHOUT CONTRAST TECHNIQUE: Multiplanar, multi-echo pulse sequences of the brain and surrounding structures were acquired without intravenous contrast. Angiographic images of the Circle of Willis were acquired using MRA technique without intravenous contrast. COMPARISON:  No prior MRI available, correlation is made with CT head 12/19/2022 FINDINGS: MRI HEAD FINDINGS Brain: Restricted diffusion with ADC correlate in the left pons (series 5, images 73-76), which measures up to 1.5 x 1.2 x 0.9 cm. This area is associated with increased T2 hyperintense signal, likely cytotoxic edema. No acute hemorrhage, mass, mass effect, or midline shift. No hydrocephalus or extra-axial collection. Normal pituitary and craniocervical junction. Hemosiderin deposition in the right thalamus and cerebral peduncle, likely sequela of prior hypertensive microhemorrhage. Vascular: Please see MRA findings below. Skull and upper cervical spine: Normal marrow signal. Sinuses/Orbits: Mucosal thickening in the ethmoid air cells. No acute finding in the orbits. Other: The mastoid air cells are well aerated. MRA HEAD FINDINGS Anterior circulation: Both internal carotid arteries are patent to the termini, without significant stenosis. A1 segments patent. Normal anterior communicating artery. Anterior cerebral arteries are patent to their distal aspects without significant stenosis. 1-2 mm laterally directed outpouching from the proximal right A2 (series 9, image 111), which may represent a tiny aneurysm versus an infundibulum. No M1 stenosis or occlusion. Distal MCA branches perfused to their distal aspects without significant stenosis. Posterior circulation: Right dominant system, with relatively large right vertebral and basilar arteries, which are otherwise unremarkable; no evidence of aneurysmal dilatation or significant tortuosity. Vertebral arteries patent to the vertebrobasilar junction  without stenosis. Basilar patent to its distal aspect. Superior cerebellar arteries patent proximally. Patent P1 segments. PCAs perfused to their distal aspects without significant stenosis. The right posterior communicating artery is patent. Anatomic variants: None significant IMPRESSION: 1. Acute infarct in the left pons. 2. No intracranial large vessel occlusion or significant stenosis. 3. 1-2 mm laterally directed outpouching from the proximal right A2, which may represent a tiny aneurysm versus an infundibulum. These results were called by telephone at the time of interpretation on 12/19/2022 at 8:25 pm to provider [facility], who verbally acknowledged these results. Electronically Signed   By: Wiliam Ke M.D.   On: 12/19/2022 20:25   CT Head Wo Contrast  Result Date: 12/19/2022 CLINICAL DATA:  Hyperglycemia and weakness. EXAM: CT HEAD WITHOUT CONTRAST TECHNIQUE: Contiguous axial images were obtained from the base of the skull through the vertex without intravenous contrast. RADIATION DOSE REDUCTION: This exam was performed according to the departmental dose-optimization program which includes automated exposure control, adjustment of the mA and/or kV according to patient size and/or use of iterative reconstruction technique. COMPARISON:  None Available. FINDINGS: Brain: No evidence of acute infarction, hemorrhage, hydrocephalus, extra-axial collection or mass lesion/mass effect. Vascular: The basilar artery measures approximately 6 mm in diameter and is mildly calcified and tortuous in appearance. Skull: Normal. Negative for fracture or focal lesion. Sinuses/Orbits: No acute finding. Other: None. IMPRESSION: 1. No acute intracranial abnormality. 2. 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 Electronically Signed   By: Demetrius Revel.D.  On: 12/19/2022 15:15    Vitals:   12/20/22 1038 12/20/22 1145 12/20/22 1157 12/20/22 1221  BP: 121/76  121/76 138/86  Pulse:  86 84 86 81  Resp: 20 (!) 23 19 19   Temp:   98.1 F (36.7 C) 98 F (36.7 C)  TempSrc:    Oral  SpO2: 96% 100% 100% 100%  Weight:      Height:         PHYSICAL EXAM General:  Alert, well-nourished, well-developed patient in no acute distress Psych:  Mood and affect appropriate for situation CV: Regular rate and rhythm on monitor Respiratory:  Regular, unlabored respirations on room air   NEURO:  Mental Status: AA&Ox3, patient is able to give clear and coherent history Speech/Language: speech is without dysarthria or aphasia.    Cranial Nerves:  II: PERRL. Visual fields full.  III, IV, VI: EOMI. Eyelids elevate symmetrically.  V: Sensation is intact to light touch and symmetrical to face.  VII: Face is symmetrical resting and smiling VIII: hearing intact to voice. IX, X: Phonation is normal.  JX:[id number] shrug stronger on the left than the right XII: tongue is midline without fasciculations. Motor: 5/5 strength to left upper and lower extremity, 4 -/5 to right upper and lower extremities Tone: is normal and bulk is normal Sensation- Intact to light touch bilaterally.  Coordination: FTN intact bilaterally.No drift.  Gait- deferred   ASSESSMENT/PLAN  Acute Ischemic Infarct:  left pontine stroke, subacute, likely small vessel disease  CT head No acute abnormality.  Atherosclerotic dolichoectasia of the basilar artery CTA head & neck areas of caliber change in proximal left V2 segment, concerning for dissection vs. Hypoplastic. no intracranial LVO, mild stenosis in distal right supraclinoid ICA MRI acute left pontine infarct MRA LVO or significant stenosis, 1 to 2 mm laterally directed outpouching from proximal right A2, which may represent a tiny aneurysm versus an infundibulum.  2D Echo EF 65 to 70%, no atrial level shunt, left atrium normal in size LDL 180 HgbA1c pending UDS neg VTE prophylaxis -SCDs No antithrombotic prior to admission, now on aspirin 81 mg daily and  clopidogrel 75 mg daily for 3 weeks and then aspirin alone. Therapy recommendations:  CIR Disposition: Pending  Hypertension Home meds: Hydrochlorothiazide 12.5 mg daily, lisinopril 10 mg daily Stable Maintain normotension  Hyperlipidemia Home meds: Rosuvastatin 20 mg daily, increased to 40 LDL 180, goal < 70 Continue statin at discharge  Diabetes type II Uncontrolled Home meds: Insulin aspart 4 units before meals if blood sugar greater than 300, insulin glargine 50 units daily HgbA1c pending goal < 7.0 CBGs SSI Diabetes coordinator consult Recommend close follow-up with PCP for better DM control  Other Stroke Risk Factors Obesity, Body mass index is 36.58 kg/m., BMI >/= 30 associated with increased stroke risk, recommend weight loss, diet and exercise as appropriate   Other Active Problems Mild leukocytosis WBC 11.6  Hospital day # 0  Patient seen by NP and then by MD, MD to edit note as needed. Cortney E Ernestina Columbia , MSN, AGACNP-BC [facility] See Amion for schedule and pager information 12/20/2022 1:42 PM   ATTENDING NOTE: I reviewed above note and agree with the assessment and plan. Pt was seen and examined.   No family at bedside.  Patient lying bed, stated that she takes insulin and statin medication at home.  On exam, she still has right hemiparesis, mild right facial droop.  MRI showed left pontine infarct.  CTA head and neck left  P2 dissection versus hypoplastic vessel.  Etiology still most likely small vessel disease given uncontrolled risk factor.  LDL 180, A1c pending.  On DAPT for 3 weeks and then aspirin alone.  Crestor increased from 20 to 40.  Aggressive stroke risk factor modification.  PT and OT recommend CIR  For detailed assessment and plan, please refer to above/below as I have made changes wherever appropriate.   Neurology will sign off. Please call with questions. Pt will follow up with stroke clinic NP at [facility] in about 4 weeks. Thanks for  the consult.   Marvel Plan, MD PhD Stroke Neurology 12/20/2022 6:57 PM    To contact Stroke Continuity provider, please refer to [email or web address]. After hours, contact General Neurology

## 2022-12-20 NOTE — Evaluation (Addendum)
Physical Therapy Evaluation Patient Details Name: Lisa Werner MRN: [id number] DOB: Nov 03, 1980 Today's Date: 12/20/2022  History of Present Illness  Lisa Werner is a 42 y.o. female presented from home to [facility] ED complaining of right-sided weakness. 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 PHMx: diabetes mellitus 2, hypertension, hyperlipidemia.  Clinical Impression  Pt admitted with/for stroke with R sided weakness.  Pt needing min to light mod assist for mobility OOB and gait.  Pt currently limited functionally due to the problems listed. ( See problems list.)   Pt will benefit from PT to maximize function and safety in order to get ready for next venue listed below.  Completed education for pt's risk factors and signs of stroke  "BE FAST".         If plan is discharge home, recommend the following: A little help with walking and/or transfers;A little help with bathing/dressing/bathroom;Assistance with cooking/housework;Help with stairs or ramp for entrance   Can travel by private vehicle        Equipment Recommendations Other (comment) (TBA`)  Recommendations for Other Services  Rehab consult    Functional Status Assessment Patient has had a recent decline in their functional status and demonstrates the ability to make significant improvements in function in a reasonable and predictable amount of time.     Precautions / Restrictions Precautions Precautions: Fall Restrictions Weight Bearing Restrictions: No      Mobility  Bed Mobility Overal bed mobility: Needs Assistance Bed Mobility: Supine to Sit, Sit to Supine     Supine to sit: Contact guard Sit to supine: Contact guard assist        Transfers Overall transfer level: Needs assistance   Transfers: Sit to/from Stand, Bed to chair/wheelchair/BSC Sit to Stand: Min assist Stand pivot transfers: Min assist         General transfer comment: cues for hand placement/safety.   Stability assist    Ambulation/Gait Ambulation/Gait assistance: Mod assist Gait Distance (Feet): 75 Feet Assistive device: 1 person hand held assist Gait Pattern/deviations: Step-through pattern, Step-to pattern, Decreased stance time - right, Decreased stride length, Decreased step length - left   Gait velocity interpretation: <1.8 ft/sec, indicate of risk for recurrent falls   General Gait Details: unccordinated and unsteady gait overall.  decrease control advancing R LE and the unsteady w/bearing on the RLE to advance the L LE.  Stairs            Wheelchair Mobility     Tilt Bed    Modified Rankin (Stroke Patients Only) Modified Rankin (Stroke Patients Only) Pre-Morbid Rankin Score: No symptoms Modified Rankin: Moderately severe disability     Balance Overall balance assessment: Needs assistance Sitting-balance support: Single extremity supported, No upper extremity supported, Feet supported Sitting balance-Leahy Scale: Fair     Standing balance support: Single extremity supported, During functional activity Standing balance-Leahy Scale: Poor Standing balance comment: reliant on external support.                             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 that is  related to her (pt's father is taking care of him at present)    Prior Function Prior Level of Function : Working/employed;Driving;Independent/Modified Independent                     Extremity/Trunk Assessment   Upper Extremity  Assessment Upper Extremity Assessment: Defer to OT evaluation RUE Deficits / Details: Pt with minimal controlled use of RUE for functional tasks--has movement at all joints (decreased strength and coordination RUE Coordination: decreased fine motor;decreased gross motor    Lower Extremity Assessment Lower Extremity Assessment: RLE deficits/detail RLE Deficits / Details: weakness, soft buckling, poor heel/toe patterning. RLE Coordination: decreased fine motor    Cervical / Trunk Assessment Cervical / Trunk Assessment: Normal  Communication   Communication Communication: No apparent difficulties  Cognition Arousal: Alert Behavior During Therapy: WFL for tasks assessed/performed Overall Cognitive Status: Within Functional Limits for tasks assessed                                          General Comments General comments (skin integrity, edema, etc.): vss    Exercises     Assessment/Plan    PT Assessment Patient needs continued PT services  PT Problem List Decreased strength;Decreased activity tolerance;Decreased balance;Decreased mobility;Decreased coordination;Decreased knowledge of use of DME       PT Treatment Interventions DME instruction;Gait training;Stair training;Functional mobility training;Therapeutic activities;Balance training;Neuromuscular re-education;Patient/family education    PT Goals (Current goals can be found in the Care Plan section)  Acute Rehab PT Goals Patient Stated Goal: back to work as soon as possible. PT Goal Formulation: With patient Time For Goal Achievement: 01/03/23 Potential to Achieve Goals: Good    Frequency Min 1X/week     Co-evaluation               AM-PAC PT "6 Clicks" Mobility  Outcome Measure Help needed turning from your back to your side while in a flat bed without using bedrails?: A Little Help needed moving from lying on your back to sitting on the side of a flat bed without using bedrails?: A  Little Help needed moving to and from a bed to a chair (including a wheelchair)?: A Little Help needed standing up from a chair using your arms (e.g., wheelchair or bedside chair)?: A Little Help needed to walk in hospital room?: A Lot Help needed climbing 3-5 steps with a railing? : A Lot 6 Click Score: 16    End of Session   Activity Tolerance: Patient tolerated treatment well Patient left: in bed;with call bell/phone within reach Nurse Communication: Mobility status PT Visit Diagnosis: Unsteadiness on feet (R26.81);Other symptoms and signs involving the nervous system (R29.898);Other abnormalities of gait and mobility (R26.89)    Time: 8469-6295 PT Time Calculation (min) (ACUTE ONLY): 22 min   Charges:   PT Evaluation $PT Eval Moderate Complexity: 1 Mod   PT General Charges $$ ACUTE PT VISIT: 1 Visit         12/20/2022  Jacinto Halim., PT Acute Rehabilitation Services [phone]  (office)  Eliseo Gum Phyliss Hulick 12/20/2022, 1:09 PM

## 2022-12-20 NOTE — Progress Notes (Signed)
  Echocardiogram 2D Echocardiogram has been performed.  Lisa Werner 12/20/2022, 10:16 AM

## 2022-12-20 NOTE — ED Notes (Signed)
Pt sleeping at this time, nad noted. Resp e.u

## 2022-12-20 NOTE — ED Notes (Signed)
ED TO INPATIENT HANDOFF REPORT  ED Nurse Name and Phone #: [phone]  S Name/Age/Gender Marsh Dolly 42 y.o. female Room/Bed: 042C/042C  Code Status   Code Status: Full Code  Home/SNF/Other Home Patient oriented to: self, place, time, and situation Is this baseline? Yes   Triage Complete: Triage complete  Chief Complaint Acute ischemic stroke [facility]) [I63.9]  Triage Note Pt BIB EMS from PCP, was being seen for right sided weakness that has been going on for approx 4 days. At office pt had episode of explosive diarrhea and high blood sugar. Pt states only one additional episode of diarrhea in past 24 hours. CBG with EMS 536. Aox4.    Allergies Allergies  Allergen Reactions   Pollen Extract     Level of Care/Admitting Diagnosis ED Disposition     ED Disposition  Admit   Condition  --   Comment  Hospital Area: [facility] [[id number]]  Level of Care: Telemetry Medical [104]  May place patient in observation at [facility] or [facility] if equivalent level of care is available:: No  Covid Evaluation: Asymptomatic - no recent exposure (last 10 days) testing not required  Diagnosis: Acute ischemic stroke [facility]) [[id number]]  Admitting Physician: Angie Fava [[id number]]  Attending Physician: Angie Fava [[id number]]          B Medical/Surgery History Past Medical History:  Diagnosis Date   Diabetes mellitus without complication (HCC)    Hyperlipidemia    Hypertension    History reviewed. No pertinent surgical history.   A IV Location/Drains/Wounds Patient Lines/Drains/Airways Status     Active Line/Drains/Airways     Name Placement date Placement time Site Days   Peripheral IV 12/19/22 20 G 1" Right Antecubital 12/19/22  1535  Antecubital  1            Intake/Output Last 24 hours  Intake/Output Summary (Last 24 hours) at 12/20/2022 0912 Last data filed at 12/19/2022 1645 Gross per 24 hour  Intake 1000 ml  Output --   Net 1000 ml    Labs/Imaging Results for orders placed or performed during the hospital encounter of 12/19/22 (from the past 48 hour(s))  CBG monitoring, ED     Status: Abnormal   Collection Time: 12/19/22 11:28 AM  Result Value Ref Range   Glucose-Capillary 416 (H) 70 - 99 mg/dL    Comment: Glucose reference range applies only to samples taken after fasting for at least 8 hours.  CBC with Differential     Status: Abnormal   Collection Time: 12/19/22 11:36 AM  Result Value Ref Range   WBC 13.4 (H) 4.0 - 10.5 K/uL   RBC 4.44 3.87 - 5.11 MIL/uL   Hemoglobin 13.0 12.0 - 15.0 g/dL   HCT 55.7 32.2 - 02.5 %   MCV 90.8 80.0 - 100.0 fL   MCH 29.3 26.0 - 34.0 pg   MCHC 32.3 30.0 - 36.0 g/dL   RDW 42.7 06.2 - 37.6 %   Platelets 292 150 - 400 K/uL   nRBC 0.0 0.0 - 0.2 %   Neutrophils Relative % 82 %   Neutro Abs 11.0 (H) 1.7 - 7.7 K/uL   Lymphocytes Relative 10 %   Lymphs Abs 1.3 0.7 - 4.0 K/uL   Monocytes Relative 4 %   Monocytes Absolute 0.6 0.1 - 1.0 K/uL   Eosinophils Relative 2 %   Eosinophils Absolute 0.3 0.0 - 0.5 K/uL   Basophils Relative 1 %   Basophils Absolute 0.1  0.0 - 0.1 K/uL   Immature Granulocytes 1 %   Abs Immature Granulocytes 0.08 (H) 0.00 - 0.07 K/uL    Comment: Performed at [facility], [address] [place], [place] [address]  Comprehensive metabolic panel     Status: Abnormal   Collection Time: 12/19/22 11:36 AM  Result Value Ref Range   Sodium 131 (L) 135 - 145 mmol/L   Potassium 4.4 3.5 - 5.1 mmol/L   Chloride 95 (L) 98 - 111 mmol/L   CO2 20 (L) 22 - 32 mmol/L   Glucose, Bld 402 (H) 70 - 99 mg/dL    Comment: Glucose reference range applies only to samples taken after fasting for at least 8 hours.   BUN 9 6 - 20 mg/dL   Creatinine, Ser 3.66 0.44 - 1.00 mg/dL   Calcium 9.2 8.9 - 44.0 mg/dL   Total Protein 7.1 6.5 - 8.1 g/dL   Albumin 3.0 (L) 3.5 - 5.0 g/dL   AST 22 15 - 41 U/L   ALT 23 0 - 44 U/L   Alkaline Phosphatase 94 38 - 126 U/L   Total  Bilirubin 0.8 0.3 - 1.2 mg/dL   GFR, Estimated >34 >74 mL/min    Comment: (NOTE) Calculated using the CKD-EPI Creatinine Equation (2021)    Anion gap 16 (H) 5 - 15    Comment: Performed at [facility], [address], [place], [place] [address]  Rapid urine drug screen (hospital performed)     Status: None   Collection Time: 12/19/22 11:36 AM  Result Value Ref Range   Opiates NONE DETECTED NONE DETECTED   Cocaine NONE DETECTED NONE DETECTED   Benzodiazepines NONE DETECTED NONE DETECTED   Amphetamines NONE DETECTED NONE DETECTED   Tetrahydrocannabinol NONE DETECTED NONE DETECTED   Barbiturates NONE DETECTED NONE DETECTED    Comment: (NOTE) DRUG SCREEN FOR MEDICAL PURPOSES ONLY.  IF CONFIRMATION IS NEEDED FOR ANY PURPOSE, NOTIFY LAB WITHIN 5 DAYS.  LOWEST DETECTABLE LIMITS FOR URINE DRUG SCREEN Drug Class                     Cutoff (ng/mL) Amphetamine and metabolites    1000 Barbiturate and metabolites    200 Benzodiazepine                 200 Opiates and metabolites        300 Cocaine and metabolites        300 THC                            50 Performed at [facility], [address] [place], [place] [address]   Magnesium     Status: None   Collection Time: 12/19/22 11:36 AM  Result Value Ref Range   Magnesium 1.7 1.7 - 2.4 mg/dL    Comment: Performed at [facility], [address] [place], [place] [address]  Ethanol     Status: None   Collection Time: 12/19/22 12:00 PM  Result Value Ref Range   Alcohol, Ethyl (B) <10 <10 mg/dL    Comment: (NOTE) Lowest detectable limit for serum alcohol is 10 mg/dL.  For medical purposes only. Performed at [facility], [address] [place], [place] [address]   Beta-hydroxybutyric acid     Status: None   Collection Time: 12/19/22 12:00 PM  Result Value Ref Range   Beta-Hydroxybutyric Acid 0.14  0.05 - 0.27 mmol/L    Comment: Performed at [facility], [address] [place], [place]  [address]  Urinalysis, Routine w reflex microscopic -Urine, Clean Catch     Status: Abnormal   Collection Time: 12/19/22 12:28 PM  Result Value Ref Range   Color, Urine YELLOW (A) YELLOW   APPearance CLOUDY (A) CLEAR   Specific Gravity, Urine 1.026 1.005 - 1.030   pH 5.0 5.0 - 8.0   Glucose, UA >=500 (A) NEGATIVE mg/dL   Hgb urine dipstick MODERATE (A) NEGATIVE   Bilirubin Urine NEGATIVE NEGATIVE   Ketones, ur 5 (A) NEGATIVE mg/dL   Protein, ur 30 (A) NEGATIVE mg/dL   Nitrite NEGATIVE NEGATIVE   Leukocytes,Ua NEGATIVE NEGATIVE   RBC / HPF 0-5 0 - 5 RBC/hpf   WBC, UA 0-5 0 - 5 WBC/hpf   Bacteria, UA RARE (A) NONE SEEN   Squamous Epithelial / HPF 0-5 0 - 5 /HPF    Comment: Performed at [facility], [address], [place], [place] [address]  I-Stat venous blood gas, [facility] ED, [facility], [facility])     Status: Abnormal   Collection Time: 12/19/22 12:58 PM  Result Value Ref Range   pH, Ven 7.379 7.25 - 7.43   pCO2, Ven 34.4 (L) 44 - 60 mmHg   pO2, Ven 33 32 - 45 mmHg   Bicarbonate 20.3 20.0 - 28.0 mmol/L   TCO2 21 (L) 22 - 32 mmol/L   O2 Saturation 64 %   Acid-base deficit 4.0 (H) 0.0 - 2.0 mmol/L   Sodium 130 (L) 135 - 145 mmol/L   Potassium 4.3 3.5 - 5.1 mmol/L   Calcium, Ion 1.11 (L) 1.15 - 1.40 mmol/L   HCT 40.0 36.0 - 46.0 %   Hemoglobin 13.6 12.0 - 15.0 g/dL   Sample type VENOUS    Comment NOTIFIED PHYSICIAN   CBG monitoring, ED     Status: Abnormal   Collection Time: 12/19/22  4:08 PM  Result Value Ref Range   Glucose-Capillary 239 (H) 70 - 99 mg/dL    Comment: Glucose reference range applies only to samples taken after fasting for at least 8 hours.  CBG monitoring, ED     Status: Abnormal   Collection Time: 12/19/22  4:58 PM  Result Value Ref Range   Glucose-Capillary 185 (H) 70 - 99 mg/dL    Comment: Glucose reference range applies only to samples taken after fasting for at least 8 hours.  CBG monitoring, ED     Status: Abnormal   Collection Time: 12/19/22 10:18 PM  Result  Value Ref Range   Glucose-Capillary 253 (H) 70 - 99 mg/dL    Comment: Glucose reference range applies only to samples taken after fasting for at least 8 hours.  CBC with Differential/Platelet     Status: Abnormal   Collection Time: 12/20/22  1:33 AM  Result Value Ref Range   WBC 11.6 (H) 4.0 - 10.5 K/uL   RBC 4.67 3.87 - 5.11 MIL/uL   Hemoglobin 13.2 12.0 - 15.0 g/dL   HCT 09.8 11.9 - 14.7 %   MCV 89.5 80.0 - 100.0 fL   MCH 28.3 26.0 - 34.0 pg   MCHC 31.6 30.0 - 36.0 g/dL   RDW 82.9 56.2 - 13.0 %   Platelets 295 150 - 400 K/uL   nRBC 0.0 0.0 - 0.2 %   Neutrophils Relative % 65 %   Neutro Abs 7.6 1.7 - 7.7 K/uL   Lymphocytes Relative  24 %   Lymphs Abs 2.8 0.7 - 4.0 K/uL   Monocytes Relative 6 %   Monocytes Absolute 0.7 0.1 - 1.0 K/uL   Eosinophils Relative 4 %   Eosinophils Absolute 0.4 0.0 - 0.5 K/uL   Basophils Relative 1 %   Basophils Absolute 0.1 0.0 - 0.1 K/uL   Immature Granulocytes 0 %   Abs Immature Granulocytes 0.05 0.00 - 0.07 K/uL    Comment: Performed at [facility], [address], [place], [place] [address]  Comprehensive metabolic panel     Status: Abnormal   Collection Time: 12/20/22  1:33 AM  Result Value Ref Range   Sodium 133 (L) 135 - 145 mmol/L   Potassium 3.5 3.5 - 5.1 mmol/L   Chloride 101 98 - 111 mmol/L   CO2 21 (L) 22 - 32 mmol/L   Glucose, Bld 279 (H) 70 - 99 mg/dL    Comment: Glucose reference range applies only to samples taken after fasting for at least 8 hours.   BUN 7 6 - 20 mg/dL   Creatinine, Ser 4.03 0.44 - 1.00 mg/dL   Calcium 8.8 (L) 8.9 - 10.3 mg/dL   Total Protein 7.1 6.5 - 8.1 g/dL   Albumin 2.8 (L) 3.5 - 5.0 g/dL   AST 24 15 - 41 U/L   ALT 21 0 - 44 U/L   Alkaline Phosphatase 87 38 - 126 U/L   Total Bilirubin 0.7 0.3 - 1.2 mg/dL   GFR, Estimated >47 >42 mL/min    Comment: (NOTE) Calculated using the CKD-EPI Creatinine Equation (2021)    Anion gap 11 5 - 15    Comment: Performed at [facility], [address] [place], [place] [address]  Magnesium     Status: None   Collection Time: 12/20/22  1:33 AM  Result Value Ref Range   Magnesium 1.9 1.7 - 2.4 mg/dL    Comment: Performed at [facility], [address] [place], [place] [address]  hCG, quantitative, pregnancy     Status: None   Collection Time: 12/20/22  1:33 AM  Result Value Ref Range   hCG, Beta Chain, Quant, S <1 <5 mIU/mL    Comment:          GEST. AGE      CONC.  (mIU/mL)   <=1 WEEK        5 - 50     2 WEEKS       50 - 500     3 WEEKS       100 - 10,000     4 WEEKS     1,000 - 30,000     5 WEEKS     3,500 - 115,000   6-8 WEEKS     12,000 - 270,000    12 WEEKS     15,000 - 220,000        FEMALE AND NON-PREGNANT FEMALE:     LESS THAN 5 mIU/mL Performed at [facility], [address], [place], [place] [address]   Lipid panel     Status: Abnormal   Collection Time: 12/20/22  1:33 AM  Result Value Ref Range   Cholesterol 265 (H) 0 - 200 mg/dL   Triglycerides 188 (H) <150 mg/dL   HDL 36 (L) >41 mg/dL   Total CHOL/HDL Ratio 7.4 RATIO   VLDL 49 (H) 0 - 40 mg/dL   LDL Cholesterol 660 (H) 0 - 99 mg/dL  Comment:        Total Cholesterol/HDL:CHD Risk Coronary Heart Disease Risk Table                     Men   Women  1/2 Average Risk   3.4   3.3  Average Risk       5.0   4.4  2 X Average Risk   9.6   7.1  3 X Average Risk  23.4   11.0        Use the calculated Patient Ratio above and the CHD Risk Table to determine the patient's CHD Risk.        ATP III CLASSIFICATION (LDL):  <100     mg/dL   Optimal  413-244  mg/dL   Near or Above                    Optimal  130-159  mg/dL   Borderline  010-272  mg/dL   High  >536     mg/dL   Very High Performed at [facility], [address] [place], [place] [address]   CBG monitoring, ED     Status: Abnormal   Collection Time: 12/20/22  8:16 AM  Result Value Ref Range   Glucose-Capillary 308 (H) 70 - 99 mg/dL    Comment: Glucose reference range applies only to  samples taken after fasting for at least 8 hours.   CT ANGIO HEAD NECK W WO CM  Result Date: 12/19/2022 CLINICAL DATA:  Stroke on MRI, determine embolic source EXAM: CT ANGIOGRAPHY HEAD AND NECK WITH AND WITHOUT CONTRAST TECHNIQUE: Multidetector CT imaging of the head and neck was performed using the standard protocol during bolus administration of intravenous contrast. Multiplanar CT image reconstructions and MIPs were obtained to evaluate the vascular anatomy. Carotid stenosis measurements (when applicable) are obtained utilizing NASCET criteria, using the distal internal carotid diameter as the denominator. RADIATION DOSE REDUCTION: This exam was performed according to the departmental dose-optimization program which includes automated exposure control, adjustment of the mA and/or kV according to patient size and/or use of iterative reconstruction technique. CONTRAST:  75mL OMNIPAQUE IOHEXOL 350 MG/ML SOLN COMPARISON:  12/19/2022 CT head, MRI head, and MRA head, no prior CTA FINDINGS: CT HEAD FINDINGS For noncontrast findings, please see same day CT head. CTA NECK FINDINGS Aortic arch: Two-vessel arch with a common origin of the brachiocephalic and left common carotid arteries. Imaged portion shows no evidence of aneurysm or dissection. No significant stenosis of the major arch vessel origins. Right carotid system: No evidence of dissection, occlusion, or hemodynamically significant stenosis (greater than 50%). Atherosclerotic disease at the bifurcation and in the proximal ICA is not hemodynamically significant. Left carotid system: No evidence of dissection, occlusion, or hemodynamically significant stenosis (greater than 50%). Atherosclerotic disease at the bifurcation and in the proximal ICA is not hemodynamically significant. Vertebral arteries: Right dominant system. The left vertebral artery is patent to the skull base, with areas of caliber change in the proximal left V2 segment (series 7, image 241 an  series 9, image 109 and 113), concerning for dissection. The right vertebral artery is dominant and patent to the skull base without evidence of dissection or significant stenosis. Skeleton: No acute osseous abnormality. Degenerative changes in the cervical spine. Other neck: No acute finding. Upper chest: No focal pulmonary opacity or pleural effusion. Review of the MIP images confirms the above findings CTA HEAD FINDINGS Anterior circulation: Both internal carotid arteries  are patent to the termini, with mild stenosis in the distal right supraclinoid ICA (series 7, image 107 A1 segments patent. Normal anterior communicating artery. Anterior cerebral arteries are patent to their distal aspects without significant stenosis. No M1 stenosis or occlusion. MCA branches perfused to their distal aspects without significant stenosis. Posterior circulation: Vertebral arteries patent to the vertebrobasilar junction without significant stenosis. Posterior inferior cerebellar arteries patent proximally. Basilar patent to its distal aspect without significant stenosis. Superior cerebellar arteries patent proximally. Patent P1 segments. PCAs perfused to their distal aspects without significant stenosis. The right posterior communicating artery is patent. Venous sinuses: As permitted by contrast timing, patent. Anatomic variants: None significant. Review of the MIP images confirms the above findings IMPRESSION: 1. Areas of caliber change in the proximal left V2 segment, concerning for dissection. 2. No intracranial large vessel occlusion. Mild stenosis in the distal right supraclinoid ICA. 3. No hemodynamically significant stenosis in the neck. Imaging results were communicated on 12/19/2022 at 11:47 pm to provider Dr. Wilford Corner via secure text paging. Electronically Signed   By: Wiliam Ke M.D.   On: 12/19/2022 23:48   MR BRAIN WO CONTRAST  Result Date: 12/19/2022 CLINICAL DATA:  Stroke suspected EXAM: MRI HEAD WITHOUT  CONTRAST MRA HEAD WITHOUT CONTRAST TECHNIQUE: Multiplanar, multi-echo pulse sequences of the brain and surrounding structures were acquired without intravenous contrast. Angiographic images of the Circle of Willis were acquired using MRA technique without intravenous contrast. COMPARISON:  No prior MRI available, correlation is made with CT head 12/19/2022 FINDINGS: MRI HEAD FINDINGS Brain: Restricted diffusion with ADC correlate in the left pons (series 5, images 73-76), which measures up to 1.5 x 1.2 x 0.9 cm. This area is associated with increased T2 hyperintense signal, likely cytotoxic edema. No acute hemorrhage, mass, mass effect, or midline shift. No hydrocephalus or extra-axial collection. Normal pituitary and craniocervical junction. Hemosiderin deposition in the right thalamus and cerebral peduncle, likely sequela of prior hypertensive microhemorrhage. Vascular: Please see MRA findings below. Skull and upper cervical spine: Normal marrow signal. Sinuses/Orbits: Mucosal thickening in the ethmoid air cells. No acute finding in the orbits. Other: The mastoid air cells are well aerated. MRA HEAD FINDINGS Anterior circulation: Both internal carotid arteries are patent to the termini, without significant stenosis. A1 segments patent. Normal anterior communicating artery. Anterior cerebral arteries are patent to their distal aspects without significant stenosis. 1-2 mm laterally directed outpouching from the proximal right A2 (series 9, image 111), which may represent a tiny aneurysm versus an infundibulum. No M1 stenosis or occlusion. Distal MCA branches perfused to their distal aspects without significant stenosis. Posterior circulation: Right dominant system, with relatively large right vertebral and basilar arteries, which are otherwise unremarkable; no evidence of aneurysmal dilatation or significant tortuosity. Vertebral arteries patent to the vertebrobasilar junction without stenosis. Basilar patent to its  distal aspect. Superior cerebellar arteries patent proximally. Patent P1 segments. PCAs perfused to their distal aspects without significant stenosis. The right posterior communicating artery is patent. Anatomic variants: None significant IMPRESSION: 1. Acute infarct in the left pons. 2. No intracranial large vessel occlusion or significant stenosis. 3. 1-2 mm laterally directed outpouching from the proximal right A2, which may represent a tiny aneurysm versus an infundibulum. These results were called by telephone at the time of interpretation on 12/19/2022 at 8:25 pm to provider [facility], who verbally acknowledged these results. Electronically Signed   By: Wiliam Ke M.D.   On: 12/19/2022 20:25   MR ANGIO HEAD WO CONTRAST  Result Date:  12/19/2022 CLINICAL DATA:  Stroke suspected EXAM: MRI HEAD WITHOUT CONTRAST MRA HEAD WITHOUT CONTRAST TECHNIQUE: Multiplanar, multi-echo pulse sequences of the brain and surrounding structures were acquired without intravenous contrast. Angiographic images of the Circle of Willis were acquired using MRA technique without intravenous contrast. COMPARISON:  No prior MRI available, correlation is made with CT head 12/19/2022 FINDINGS: MRI HEAD FINDINGS Brain: Restricted diffusion with ADC correlate in the left pons (series 5, images 73-76), which measures up to 1.5 x 1.2 x 0.9 cm. This area is associated with increased T2 hyperintense signal, likely cytotoxic edema. No acute hemorrhage, mass, mass effect, or midline shift. No hydrocephalus or extra-axial collection. Normal pituitary and craniocervical junction. Hemosiderin deposition in the right thalamus and cerebral peduncle, likely sequela of prior hypertensive microhemorrhage. Vascular: Please see MRA findings below. Skull and upper cervical spine: Normal marrow signal. Sinuses/Orbits: Mucosal thickening in the ethmoid air cells. No acute finding in the orbits. Other: The mastoid air cells are well aerated. MRA HEAD FINDINGS  Anterior circulation: Both internal carotid arteries are patent to the termini, without significant stenosis. A1 segments patent. Normal anterior communicating artery. Anterior cerebral arteries are patent to their distal aspects without significant stenosis. 1-2 mm laterally directed outpouching from the proximal right A2 (series 9, image 111), which may represent a tiny aneurysm versus an infundibulum. No M1 stenosis or occlusion. Distal MCA branches perfused to their distal aspects without significant stenosis. Posterior circulation: Right dominant system, with relatively large right vertebral and basilar arteries, which are otherwise unremarkable; no evidence of aneurysmal dilatation or significant tortuosity. Vertebral arteries patent to the vertebrobasilar junction without stenosis. Basilar patent to its distal aspect. Superior cerebellar arteries patent proximally. Patent P1 segments. PCAs perfused to their distal aspects without significant stenosis. The right posterior communicating artery is patent. Anatomic variants: None significant IMPRESSION: 1. Acute infarct in the left pons. 2. No intracranial large vessel occlusion or significant stenosis. 3. 1-2 mm laterally directed outpouching from the proximal right A2, which may represent a tiny aneurysm versus an infundibulum. These results were called by telephone at the time of interpretation on 12/19/2022 at 8:25 pm to provider [facility], who verbally acknowledged these results. Electronically Signed   By: Wiliam Ke M.D.   On: 12/19/2022 20:25   CT Head Wo Contrast  Result Date: 12/19/2022 CLINICAL DATA:  Hyperglycemia and weakness. EXAM: CT HEAD WITHOUT CONTRAST TECHNIQUE: Contiguous axial images were obtained from the base of the skull through the vertex without intravenous contrast. RADIATION DOSE REDUCTION: This exam was performed according to the departmental dose-optimization program which includes automated exposure control, adjustment of the  mA and/or kV according to patient size and/or use of iterative reconstruction technique. COMPARISON:  None Available. FINDINGS: Brain: No evidence of acute infarction, hemorrhage, hydrocephalus, extra-axial collection or mass lesion/mass effect. Vascular: The basilar artery measures approximately 6 mm in diameter and is mildly calcified and tortuous in appearance. Skull: Normal. Negative for fracture or focal lesion. Sinuses/Orbits: No acute finding. Other: None. IMPRESSION: 1. No acute intracranial abnormality. 2. 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 Electronically Signed   By: Aram Candela M.D.   On: 12/19/2022 15:15    Pending Labs Unresulted Labs (From admission, onward)     Start     Ordered   12/20/22 0829  Hemoglobin A1c  Add-on,   AD        12/20/22 0829   12/19/22 1958  Hemoglobin A1c  (Labs)  Add-on,   AD  Comments: To assess prior glycemic control    12/19/22 1957            Vitals/Pain Today's Vitals   12/20/22 0400 12/20/22 0701 12/20/22 0727 12/20/22 0728  BP: 136/83 114/77 110/76   Pulse: 96 84 82   Resp: 20 15 15    Temp:   98.2 F (36.8 C)   TempSrc:   Oral   SpO2: 100% 98% 97%   Weight:      Height:      PainSc:    Asleep    Isolation Precautions No active isolations  Medications Medications  acetaminophen (TYLENOL) tablet 650 mg (has no administration in time range)    Or  acetaminophen (TYLENOL) suppository 650 mg (has no administration in time range)   stroke: early stages of recovery book ( Does not apply Not Given 12/19/22 2112)  melatonin tablet 3 mg (has no administration in time range)  ondansetron (ZOFRAN) injection 4 mg (has no administration in time range)  insulin aspart (novoLOG) injection 0-15 Units (11 Units Subcutaneous Given 12/20/22 0823)  insulin aspart (novoLOG) injection 0-5 Units (3 Units Subcutaneous Given 12/19/22 2244)  rosuvastatin (CRESTOR) tablet 40 mg (has no administration in  time range)  aspirin EC tablet 81 mg (has no administration in time range)  clopidogrel (PLAVIX) tablet 75 mg (has no administration in time range)  insulin glargine-yfgn (SEMGLEE) injection 30 Units (has no administration in time range)  lactated ringers bolus 1,000 mL (0 mLs Intravenous Stopped 12/19/22 1645)  insulin aspart (novoLOG) injection 15 Units (15 Units Subcutaneous Given 12/19/22 1528)  iohexol (OMNIPAQUE) 350 MG/ML injection 75 mL (75 mLs Intravenous Contrast Given 12/19/22 2125)    Mobility walks with person assist     Focused Assessments Neuro Assessment Handoff:  Swallow screen pass? Yes  Cardiac Rhythm: Normal sinus rhythm NIH Stroke Scale  Dizziness Present: No Headache Present: No Interval: Shift assessment Level of Consciousness (1a.)   : Alert, keenly responsive LOC Questions (1b. )   : Answers both questions correctly LOC Commands (1c. )   : Performs both tasks correctly Best Gaze (2. )  : Normal Visual (3. )  : No visual loss Facial Palsy (4. )    : Normal symmetrical movements Motor Arm, Left (5a. )   : No drift Motor Arm, Right (5b. ) : Some effort against gravity Motor Leg, Left (6a. )  : No drift Motor Leg, Right (6b. ) : Drift Limb Ataxia (7. ): Absent Sensory (8. )  : Normal, no sensory loss Best Language (9. )  : No aphasia Dysarthria (10. ): Normal Extinction/Inattention (11.)   : No Abnormality Complete NIHSS TOTAL: 3     Neuro Assessment: Within Defined Limits Neuro Checks:   Shift assessment (12/19/22 2010)  Has TPA been given? No If patient is a Neuro Trauma and patient is going to OR before floor call report to 4N Charge nurse: [phone] or [phone]   R Recommendations: See Admitting Provider Note  Report given to:   Additional Notes:  pt is moderate assist with ADLs due to right sided weakness.

## 2022-12-20 NOTE — Progress Notes (Signed)
PROGRESS NOTE    Lisa Werner  ZOX:[id number]  DOB: [birth year]  DOA: 12/19/2022 PCP: Julieanne Manson, MD Outpatient Specialists:   Hospital course:  42 year old with HTN, extremely poorly controlled DM, dyslipidemia was admitted with 4 days of right-sided weakness and hyperglycemia.  MRI reveals left pontine stroke.  Subjective:  Patient thinks that she might be getting a little bit of functionality back in her right arm and leg.  Admits she does not pay too much attention to her diabetes.  Notes her A1c is 12, does not check fingersticks at home on a regular basis.   Objective: Vitals:   12/20/22 1145 12/20/22 1157 12/20/22 1221 12/20/22 1541  BP:  121/76 138/86 127/68  Pulse: 84 86 81 89  Resp: (!) 23 19 19 18   Temp:  98.1 F (36.7 C) 98 F (36.7 C) 98.2 F (36.8 C)  TempSrc:   Oral Oral  SpO2: 100% 100% 100% 97%  Weight:      Height:       No intake or output data in the 24 hours ending 12/20/22 1850 Filed Weights   12/19/22 1122  Weight: 90.7 kg     Exam:  General: Subdued somewhat dejected female lying in bed in NAD Eyes: sclera anicteric, conjuctiva mild injection bilaterally CVS: S1-S2, regular  Respiratory:  decreased air entry bilaterally secondary to decreased inspiratory effort, rales at bases  GI: NABS, soft, NT  LE: Warm and well-perfused  Data Reviewed:  Basic Metabolic Panel: Recent Labs  Lab 12/19/22 1136 12/19/22 1258 12/20/22 0133  NA 131* 130* 133*  K 4.4 4.3 3.5  CL 95*  --  101  CO2 20*  --  21*  GLUCOSE 402*  --  279*  BUN 9  --  7  CREATININE 1.00  --  0.80  CALCIUM 9.2  --  8.8*  MG 1.7  --  1.9    CBC: Recent Labs  Lab 12/19/22 1136 12/19/22 1258 12/20/22 0133  WBC 13.4*  --  11.6*  NEUTROABS 11.0*  --  7.6  HGB 13.0 13.6 13.2  HCT 40.3 40.0 41.8  MCV 90.8  --  89.5  PLT 292  --  295     Scheduled Meds:   stroke: early stages of recovery book   Does not apply Once   aspirin EC  81 mg Oral  Daily   clopidogrel  75 mg Oral Daily   insulin aspart  0-15 Units Subcutaneous TID WC   insulin aspart  0-5 Units Subcutaneous QHS   insulin aspart  15 Units Subcutaneous TID WC   insulin glargine-yfgn  20 Units Subcutaneous Daily   rosuvastatin  40 mg Oral Daily   Continuous Infusions:   Assessment & Plan:   Subacute left pontine stroke Appreciate neurology input Patient is on aspirin and Plavix Statin started for LDL of 180 TTE with bubble study is pending A1c is ordered MRI with acute pontine infarct, MRI without large vessel stenosis, echo with normal EF PT/OT assistance requested  DM2, poorly controlled A1c pending, patient admits she never checks her fingersticks at home Start glargine at 20 units per pharmacy recommendations Apparently home medication reconciliation is incorrect and patient is not on 50 units of glargine at home per pharmacy Aim for tighter control Discussed at length the importance of controlling her diabetes, patient has a 72 year old foster son and hopefully will be motivated to follow fingersticks more closely than she has.  HTN BP well-controlled on HCTZ and lisinopril per  home doses   Dyslipidemia Rosuvastatin started  Leukocytosis Appears to be chronic, at least for the past 5 years    DVT prophylaxis: SCD Code Status: Full Family Communication: None today       Studies: ECHOCARDIOGRAM COMPLETE BUBBLE STUDY  Result Date: 12/20/2022    ECHOCARDIOGRAM REPORT   Patient Name:   Lisa Werner [facility] Date of Exam: 12/20/2022 Medical Rec #:  [id number]               Height:       62.0 in Accession #:    [id number]              Weight:       200.0 lb Date of Birth:  1980/12/03              BSA:          1.912 m Patient Age:    41 years                BP:           110/76 mmHg Patient Gender: F                       HR:           88 bpm. Exam Location:  Inpatient Procedure: 2D Echo, Cardiac Doppler, Color Doppler and Strain Analysis  Indications:    Stroke  History:        Patient has no prior history of Echocardiogram examinations.                 Risk Factors:Hypertension, Diabetes and Dyslipidemia.  Sonographer:    Sheralyn Boatman RDCS Referring Phys: [id number] Angie Fava  Sonographer Comments: Patient is obese. Image acquisition challenging due to patient body habitus. IMPRESSIONS  1. Left ventricular ejection fraction, by estimation, is 65 to 70%. The left ventricle has normal function. The left ventricle has no regional wall motion abnormalities. There is moderate left ventricular hypertrophy. Left ventricular diastolic parameters were normal. The average left ventricular global longitudinal strain is -22.0 %. The global longitudinal strain is normal.  2. Right ventricular systolic function is normal. The right ventricular size is normal.  3. The mitral valve is normal in structure. Trivial mitral valve regurgitation. No evidence of mitral stenosis.  4. The aortic valve is tricuspid. Aortic valve regurgitation is not visualized. No aortic stenosis is present.  5. Agitated saline contrast bubble study was negative, with no evidence of any interatrial shunt. Comparison(s): No prior Echocardiogram. Conclusion(s)/Recommendation(s): Otherwise normal echocardiogram, with minor abnormalities described in the report. FINDINGS  Left Ventricle: Left ventricular ejection fraction, by estimation, is 65 to 70%. The left ventricle has normal function. The left ventricle has no regional wall motion abnormalities. The average left ventricular global longitudinal strain is -22.0 %. The global longitudinal strain is normal. The left ventricular internal cavity size was small. There is moderate left ventricular hypertrophy. Left ventricular diastolic parameters were normal. Right Ventricle: The right ventricular size is normal. No increase in right ventricular wall thickness. Right ventricular systolic function is normal. Left Atrium: Left atrial size was  normal in size. Right Atrium: Right atrial size was normal in size. Pericardium: There is no evidence of pericardial effusion. Mitral Valve: The mitral valve is normal in structure. Trivial mitral valve regurgitation. No evidence of mitral valve stenosis. Tricuspid Valve: The tricuspid valve is normal in structure. Tricuspid valve regurgitation is trivial. No evidence of tricuspid stenosis. Aortic  Valve: The aortic valve is tricuspid. Aortic valve regurgitation is not visualized. No aortic stenosis is present. Pulmonic Valve: The pulmonic valve was grossly normal. Pulmonic valve regurgitation is not visualized. No evidence of pulmonic stenosis. Aorta: The aortic root, ascending aorta, aortic arch and descending aorta are all structurally normal, with no evidence of dilitation or obstruction. Venous: The inferior vena cava was not well visualized. IAS/Shunts: No atrial level shunt detected by color flow Doppler. Agitated saline contrast was given intravenously to evaluate for intracardiac shunting. Agitated saline contrast bubble study was negative, with no evidence of any interatrial shunt.  LEFT VENTRICLE PLAX 2D LVIDd:         3.35 cm     Diastology LVIDs:         2.10 cm     LV e' medial:    5.55 cm/s LV PW:         1.55 cm     LV E/e' medial:  12.4 LV IVS:        1.55 cm     LV e' lateral:   5.87 cm/s LVOT diam:     2.30 cm     LV E/e' lateral: 11.7 LV SV:         68 LV SV Index:   35          2D Longitudinal Strain LVOT Area:     4.15 cm    2D Strain GLS (A2C):   -24.5 %                            2D Strain GLS (A3C):   -17.6 %                            2D Strain GLS (A4C):   -23.9 % LV Volumes (MOD)           2D Strain GLS Avg:     -22.0 % LV vol d, MOD A2C: 58.1 ml LV vol d, MOD A4C: 60.2 ml LV vol s, MOD A2C: 16.8 ml LV vol s, MOD A4C: 16.7 ml LV SV MOD A2C:     41.3 ml LV SV MOD A4C:     60.2 ml LV SV MOD BP:      42.3 ml RIGHT VENTRICLE            IVC RV S prime:     8.92 cm/s  IVC diam: 2.00 cm TAPSE  (M-mode): 1.5 cm LEFT ATRIUM             Index        RIGHT ATRIUM           Index LA diam:        3.50 cm 1.83 cm/m   RA Area:     11.50 cm LA Vol (A2C):   28.3 ml 14.80 ml/m  RA Volume:   22.80 ml  11.93 ml/m LA Vol (A4C):   32.2 ml 16.84 ml/m LA Biplane Vol: 30.3 ml 15.85 ml/m  AORTIC VALVE LVOT Vmax:   88.10 cm/s LVOT Vmean:  60.200 cm/s LVOT VTI:    0.163 m  AORTA Ao Root diam: 3.00 cm Ao Asc diam:  2.80 cm MITRAL VALVE MV Area (PHT): 4.54 cm    SHUNTS MV Decel Time: 167 msec    Systemic VTI:  0.16 m MV E velocity: 68.55 cm/s  Systemic Diam: 2.30 cm MV  A velocity: 61.70 cm/s MV E/A ratio:  1.11 Jodelle Red MD Electronically signed by Jodelle Red MD Signature Date/Time: 12/20/2022/12:03:25 PM    Final    CT ANGIO HEAD NECK W WO CM  Result Date: 12/19/2022 CLINICAL DATA:  Stroke on MRI, determine embolic source EXAM: CT ANGIOGRAPHY HEAD AND NECK WITH AND WITHOUT CONTRAST TECHNIQUE: Multidetector CT imaging of the head and neck was performed using the standard protocol during bolus administration of intravenous contrast. Multiplanar CT image reconstructions and MIPs were obtained to evaluate the vascular anatomy. Carotid stenosis measurements (when applicable) are obtained utilizing NASCET criteria, using the distal internal carotid diameter as the denominator. RADIATION DOSE REDUCTION: This exam was performed according to the departmental dose-optimization program which includes automated exposure control, adjustment of the mA and/or kV according to patient size and/or use of iterative reconstruction technique. CONTRAST:  75mL OMNIPAQUE IOHEXOL 350 MG/ML SOLN COMPARISON:  12/19/2022 CT head, MRI head, and MRA head, no prior CTA FINDINGS: CT HEAD FINDINGS For noncontrast findings, please see same day CT head. CTA NECK FINDINGS Aortic arch: Two-vessel arch with a common origin of the brachiocephalic and left common carotid arteries. Imaged portion shows no evidence of aneurysm or  dissection. No significant stenosis of the major arch vessel origins. Right carotid system: No evidence of dissection, occlusion, or hemodynamically significant stenosis (greater than 50%). Atherosclerotic disease at the bifurcation and in the proximal ICA is not hemodynamically significant. Left carotid system: No evidence of dissection, occlusion, or hemodynamically significant stenosis (greater than 50%). Atherosclerotic disease at the bifurcation and in the proximal ICA is not hemodynamically significant. Vertebral arteries: Right dominant system. The left vertebral artery is patent to the skull base, with areas of caliber change in the proximal left V2 segment (series 7, image 241 an series 9, image 109 and 113), concerning for dissection. The right vertebral artery is dominant and patent to the skull base without evidence of dissection or significant stenosis. Skeleton: No acute osseous abnormality. Degenerative changes in the cervical spine. Other neck: No acute finding. Upper chest: No focal pulmonary opacity or pleural effusion. Review of the MIP images confirms the above findings CTA HEAD FINDINGS Anterior circulation: Both internal carotid arteries are patent to the termini, with mild stenosis in the distal right supraclinoid ICA (series 7, image 107 A1 segments patent. Normal anterior communicating artery. Anterior cerebral arteries are patent to their distal aspects without significant stenosis. No M1 stenosis or occlusion. MCA branches perfused to their distal aspects without significant stenosis. Posterior circulation: Vertebral arteries patent to the vertebrobasilar junction without significant stenosis. Posterior inferior cerebellar arteries patent proximally. Basilar patent to its distal aspect without significant stenosis. Superior cerebellar arteries patent proximally. Patent P1 segments. PCAs perfused to their distal aspects without significant stenosis. The right posterior communicating artery  is patent. Venous sinuses: As permitted by contrast timing, patent. Anatomic variants: None significant. Review of the MIP images confirms the above findings IMPRESSION: 1. Areas of caliber change in the proximal left V2 segment, concerning for dissection. 2. No intracranial large vessel occlusion. Mild stenosis in the distal right supraclinoid ICA. 3. No hemodynamically significant stenosis in the neck. Imaging results were communicated on 12/19/2022 at 11:47 pm to provider Dr. Wilford Corner via secure text paging. Electronically Signed   By: Wiliam Ke M.D.   On: 12/19/2022 23:48   MR BRAIN WO CONTRAST  Result Date: 12/19/2022 CLINICAL DATA:  Stroke suspected EXAM: MRI HEAD WITHOUT CONTRAST MRA HEAD WITHOUT CONTRAST TECHNIQUE: Multiplanar, multi-echo pulse sequences  of the brain and surrounding structures were acquired without intravenous contrast. Angiographic images of the Circle of Willis were acquired using MRA technique without intravenous contrast. COMPARISON:  No prior MRI available, correlation is made with CT head 12/19/2022 FINDINGS: MRI HEAD FINDINGS Brain: Restricted diffusion with ADC correlate in the left pons (series 5, images 73-76), which measures up to 1.5 x 1.2 x 0.9 cm. This area is associated with increased T2 hyperintense signal, likely cytotoxic edema. No acute hemorrhage, mass, mass effect, or midline shift. No hydrocephalus or extra-axial collection. Normal pituitary and craniocervical junction. Hemosiderin deposition in the right thalamus and cerebral peduncle, likely sequela of prior hypertensive microhemorrhage. Vascular: Please see MRA findings below. Skull and upper cervical spine: Normal marrow signal. Sinuses/Orbits: Mucosal thickening in the ethmoid air cells. No acute finding in the orbits. Other: The mastoid air cells are well aerated. MRA HEAD FINDINGS Anterior circulation: Both internal carotid arteries are patent to the termini, without significant stenosis. A1 segments patent.  Normal anterior communicating artery. Anterior cerebral arteries are patent to their distal aspects without significant stenosis. 1-2 mm laterally directed outpouching from the proximal right A2 (series 9, image 111), which may represent a tiny aneurysm versus an infundibulum. No M1 stenosis or occlusion. Distal MCA branches perfused to their distal aspects without significant stenosis. Posterior circulation: Right dominant system, with relatively large right vertebral and basilar arteries, which are otherwise unremarkable; no evidence of aneurysmal dilatation or significant tortuosity. Vertebral arteries patent to the vertebrobasilar junction without stenosis. Basilar patent to its distal aspect. Superior cerebellar arteries patent proximally. Patent P1 segments. PCAs perfused to their distal aspects without significant stenosis. The right posterior communicating artery is patent. Anatomic variants: None significant IMPRESSION: 1. Acute infarct in the left pons. 2. No intracranial large vessel occlusion or significant stenosis. 3. 1-2 mm laterally directed outpouching from the proximal right A2, which may represent a tiny aneurysm versus an infundibulum. These results were called by telephone at the time of interpretation on 12/19/2022 at 8:25 pm to provider [facility], who verbally acknowledged these results. Electronically Signed   By: Wiliam Ke M.D.   On: 12/19/2022 20:25   MR ANGIO HEAD WO CONTRAST  Result Date: 12/19/2022 CLINICAL DATA:  Stroke suspected EXAM: MRI HEAD WITHOUT CONTRAST MRA HEAD WITHOUT CONTRAST TECHNIQUE: Multiplanar, multi-echo pulse sequences of the brain and surrounding structures were acquired without intravenous contrast. Angiographic images of the Circle of Willis were acquired using MRA technique without intravenous contrast. COMPARISON:  No prior MRI available, correlation is made with CT head 12/19/2022 FINDINGS: MRI HEAD FINDINGS Brain: Restricted diffusion with ADC correlate in  the left pons (series 5, images 73-76), which measures up to 1.5 x 1.2 x 0.9 cm. This area is associated with increased T2 hyperintense signal, likely cytotoxic edema. No acute hemorrhage, mass, mass effect, or midline shift. No hydrocephalus or extra-axial collection. Normal pituitary and craniocervical junction. Hemosiderin deposition in the right thalamus and cerebral peduncle, likely sequela of prior hypertensive microhemorrhage. Vascular: Please see MRA findings below. Skull and upper cervical spine: Normal marrow signal. Sinuses/Orbits: Mucosal thickening in the ethmoid air cells. No acute finding in the orbits. Other: The mastoid air cells are well aerated. MRA HEAD FINDINGS Anterior circulation: Both internal carotid arteries are patent to the termini, without significant stenosis. A1 segments patent. Normal anterior communicating artery. Anterior cerebral arteries are patent to their distal aspects without significant stenosis. 1-2 mm laterally directed outpouching from the proximal right A2 (series 9, image 111), which may  represent a tiny aneurysm versus an infundibulum. No M1 stenosis or occlusion. Distal MCA branches perfused to their distal aspects without significant stenosis. Posterior circulation: Right dominant system, with relatively large right vertebral and basilar arteries, which are otherwise unremarkable; no evidence of aneurysmal dilatation or significant tortuosity. Vertebral arteries patent to the vertebrobasilar junction without stenosis. Basilar patent to its distal aspect. Superior cerebellar arteries patent proximally. Patent P1 segments. PCAs perfused to their distal aspects without significant stenosis. The right posterior communicating artery is patent. Anatomic variants: None significant IMPRESSION: 1. Acute infarct in the left pons. 2. No intracranial large vessel occlusion or significant stenosis. 3. 1-2 mm laterally directed outpouching from the proximal right A2, which may  represent a tiny aneurysm versus an infundibulum. These results were called by telephone at the time of interpretation on 12/19/2022 at 8:25 pm to provider [facility], who verbally acknowledged these results. Electronically Signed   By: Wiliam Ke M.D.   On: 12/19/2022 20:25   CT Head Wo Contrast  Result Date: 12/19/2022 CLINICAL DATA:  Hyperglycemia and weakness. EXAM: CT HEAD WITHOUT CONTRAST TECHNIQUE: Contiguous axial images were obtained from the base of the skull through the vertex without intravenous contrast. RADIATION DOSE REDUCTION: This exam was performed according to the departmental dose-optimization program which includes automated exposure control, adjustment of the mA and/or kV according to patient size and/or use of iterative reconstruction technique. COMPARISON:  None Available. FINDINGS: Brain: No evidence of acute infarction, hemorrhage, hydrocephalus, extra-axial collection or mass lesion/mass effect. Vascular: The basilar artery measures approximately 6 mm in diameter and is mildly calcified and tortuous in appearance. Skull: Normal. Negative for fracture or focal lesion. Sinuses/Orbits: No acute finding. Other: None. IMPRESSION: 1. No acute intracranial abnormality. 2. 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 Electronically Signed   By: Aram Candela M.D.   On: 12/19/2022 15:15    Principal Problem:   Acute ischemic stroke [facility]) Active Problems:   DM2 (diabetes mellitus, type 2) (HCC)   Mixed hyperlipidemia   Essential hypertension   Leukocytosis   Acute right-sided weakness   CVA (cerebral vascular accident) (HCC)     Aristotelis Vilardi Orma Flaming, [facility]  If 7PM-7AM, please contact night-coverage [email or web address]   LOS: 0 days

## 2022-12-20 NOTE — Evaluation (Signed)
Occupational Therapy Evaluation Patient Details Name: Lisa Werner MRN: [id number] DOB: 11/09/1980 Today's Date: 12/20/2022   History of Present Illness Lisa Werner is a 42 y.o. female presented from home to [facility] ED complaining of right-sided weakness. 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 PHMx: diabetes mellitus 2, hypertension, hyperlipidemia.   Clinical Impression   [name] admitted with above presents to acute OT with PLOF of being totally independent with basic ADLs, IADLs, driving, working, and taking care of an 42 yo foster boy that is related to her. She presently is setup/S-Mod A for basic ADLs. She will continue to benefit from acute OT with follow up from intensive inpatient follow up therapy, >3 hours/day for her to get back to her PLOF in the most timely manner.        If plan is discharge home, recommend the following: A little help with walking and/or transfers;A lot of help with bathing/dressing/bathroom;Assistance with cooking/housework;Help with stairs or ramp for entrance;Assist for transportation    Functional Status Assessment  Patient has had a recent decline in their functional status and demonstrates the ability to make significant improvements in function in a reasonable and predictable amount of time.  Equipment Recommendations  Other (comment) (TBD next venue)       Precautions / Restrictions Precautions Precautions: Fall Restrictions Weight Bearing Restrictions: No      Mobility Bed Mobility Overal bed mobility: Needs Assistance Bed Mobility: Supine to Sit, Sit to Supine     Supine to sit: Mod assist (A trunk and legs) Sit to supine: Min assist (A legs)        Transfers Overall transfer level: Needs assistance Equipment used: 1 person hand held assist Transfers: Sit to/from Stand, Bed to chair/wheelchair/BSC Sit to Stand: Min assist Stand pivot transfers: Min assist                 Balance Overall balance assessment: Needs assistance Sitting-balance support: No upper extremity supported, Feet supported Sitting balance-Leahy Scale: Fair     Standing balance support: Single extremity supported, During functional activity Standing balance-Leahy Scale: Poor Standing balance comment: reliant on external support.                           ADL either performed or assessed with clinical judgement   ADL Overall ADL's : Needs assistance/impaired Eating/Feeding: Modified independent;Sitting   Grooming: Minimal assistance;Sitting   Upper Body Bathing: Minimal assistance;Sitting   Lower Body Bathing: Moderate assistance Lower Body Bathing Details (indicate cue type and reason): min A sit<>stand Upper Body Dressing : Minimal assistance;Sitting   Lower Body Dressing: Moderate assistance Lower Body Dressing Details (indicate cue type and reason): min A sit<>stand Toilet Transfer: Minimal assistance;Stand-pivot   Toileting- Clothing Manipulation and Hygiene: Moderate assistance Toileting - Clothing Manipulation Details (indicate cue type and reason): min A sit<>stand             Vision Baseline Vision/History: 0 No visual deficits Ability to See in Adequate Light: 0 Adequate Patient Visual Report: No change from baseline Vision Assessment?: Yes Eye Alignment: Within Functional Limits Ocular Range of Motion: Within Functional Limits Alignment/Gaze Preference: Within Defined Limits Tracking/Visual Pursuits: Able to track stimulus in all quads without difficulty Saccades: Within functional limits Convergence: Within functional limits Visual Fields: No apparent deficits            Pertinent Vitals/Pain Pain Assessment Pain Assessment: No/denies pain  Extremity/Trunk Assessment Upper Extremity Assessment Upper Extremity Assessment: Defer to OT evaluation RUE Deficits / Details: Pt with minimal controlled use of RUE for functional tasks--has  movement at all joints (decreased strength and coordination RUE Coordination: decreased fine motor;decreased gross motor      Communication Communication Communication: No apparent difficulties   Cognition Arousal: Alert Behavior During Therapy: WFL for tasks assessed/performed Overall Cognitive Status: Within Functional Limits for tasks assessed                                       General Comments  vss            Home Living Family/patient expects to be discharged to:: Private residence Living Arrangements: Alone Available Help at Discharge: Family;Available PRN/intermittently Type of Home: Apartment Home Access: Stairs to enter Entrance Stairs-Number of Steps: flight Entrance Stairs-Rails: Right Home Layout: One level     Bathroom Shower/Tub: IT trainer: Standard     Home Equipment: None   Additional Comments: works as a Therapist, nutritional; 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 that is related to her (pt's father is taking care of him at present)      Prior Functioning/Environment Prior Level of Function : Working/employed;Driving;Independent/Modified Independent                        OT Problem List: Decreased strength;Decreased range of motion;Impaired balance (sitting and/or standing);Decreased coordination;Impaired tone;Obesity;Impaired UE functional use      OT Treatment/Interventions: Self-care/ADL training;Neuromuscular education;Therapeutic exercise;Therapeutic activities;DME and/or AE instruction;Balance training;Patient/family education    OT Goals(Current goals can be found in the care plan section) Acute Rehab OT Goals Patient Stated Goal: 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 OT Goal Formulation: With patient Time For Goal Achievement: 01/03/23 Potential to Achieve Goals: Good  OT Frequency: Min 1X/week       AM-PAC OT "6 Clicks" Daily Activity     Outcome Measure  Help from another person eating meals?: A Little Help from another person taking care of personal grooming?: A Little Help from another person toileting, which includes using toliet, bedpan, or urinal?: A Lot Help from another person bathing (including washing, rinsing, drying)?: A Lot Help from another person to put on and taking off regular upper body clothing?: A Little Help from another person to put on and taking off regular lower body clothing?: A Lot 6 Click Score: 15   End of Session Nurse Communication: Mobility status  Activity Tolerance: Patient tolerated treatment well Patient left: in bed;with call bell/phone within reach  OT Visit Diagnosis: Unsteadiness on feet (R26.81);Other abnormalities of gait and mobility (R26.89);Muscle weakness (generalized) (M62.81);History of falling (Z91.81);Hemiplegia and hemiparesis Hemiplegia - Right/Left: Right Hemiplegia - dominant/non-dominant: Dominant Hemiplegia - caused by: Cerebral infarction                Time: 1610-9604 OT Time Calculation (min): 21 min Charges:  OT General Charges $OT Visit: 1 Visit OT Evaluation $OT Eval Moderate Complexity: 1 Mod  Cathy L. OT Acute Rehabilitation Services Office [phone]    Evette Georges 12/20/2022, 1:12 PM

## 2022-12-20 NOTE — Progress Notes (Signed)
? ?  Inpatient Rehab Admissions Coordinator : ? ?Per therapy recommendations, patient was screened for CIR candidacy by Barbara Boyette RN MSN.  At this time patient appears to be a potential candidate for CIR. I will place a rehab consult per protocol for full assessment. Please call me with any questions. ? ?Barbara Boyette RN MSN ?Admissions Coordinator ?[phone] ?  ?

## 2022-12-20 NOTE — ED Notes (Signed)
E[facility] at bedside.

## 2022-12-21 DIAGNOSIS — I10 Essential (primary) hypertension: Secondary | ICD-10-CM | POA: Diagnosis not present

## 2022-12-21 DIAGNOSIS — E119 Type 2 diabetes mellitus without complications: Secondary | ICD-10-CM | POA: Diagnosis not present

## 2022-12-21 DIAGNOSIS — R531 Weakness: Secondary | ICD-10-CM | POA: Diagnosis not present

## 2022-12-21 DIAGNOSIS — I639 Cerebral infarction, unspecified: Secondary | ICD-10-CM | POA: Diagnosis not present

## 2022-12-21 LAB — ANTITHROMBIN III: AntiThromb III Func: 114 % (ref 75–120)

## 2022-12-21 LAB — GLUCOSE, CAPILLARY
Glucose-Capillary: 283 mg/dL — ABNORMAL HIGH (ref 70–99)
Glucose-Capillary: 231 mg/dL — ABNORMAL HIGH (ref 70–99)
Glucose-Capillary: 316 mg/dL — ABNORMAL HIGH (ref 70–99)
Glucose-Capillary: 267 mg/dL — ABNORMAL HIGH (ref 70–99)

## 2022-12-21 NOTE — PMR Pre-admission (Signed)
PMR Admission Coordinator Pre-Admission Assessment  Patient: Lisa Werner is an 42 y.o., female MRN: [id number] DOB: 28-Jul-1980 Height: 5\' 2"  (157.5 cm) Weight: 99.5 kg  Insurance Information HMO:     PPO: yes     PCP:      IPA:      80/20:      OTHER:  PRIMARY: [employer] State [employer] Plan      Policy#: [id number]      Subscriber: pt CM Name: ***      Phone#: ***     Fax#: *** Pre-Cert#: ***      Employer:  Benefits:  Phone #: [phone]     Name:  Eff. Date: ***     Deduct: ***      Out of Pocket Max: ***      Life Max: *** CIR: ***      SNF: *** Outpatient: ***     Co-Pay: *** Home Health: ***      Co-Pay: *** DME: ***     Co-Pay: *** Providers:  SECONDARY:       Policy#:      Phone#:   Financial Counselor:       Phone#:   The "Data Collection Information Summary" for patients in Inpatient Rehabilitation Facilities with attached "Privacy Act Statement-Health Care Records" was provided and verbally reviewed with: Patient  Emergency Contact Information Contact Information     Name Relation Home Work Mobile   Sivert,Jerome Father [phone]        Other Contacts     Name Relation Home Work Mobile   Sivert,Donna Mother   [phone]       Current Medical History  Patient Admitting Diagnosis: CVA   History of Present Illness: Pt is a 42 y/o female with PMH of DM, HTN, and HLD admitted to [facility] on 12/19/22 from her PCP with 3 day history of R sided weakness.  In ED pt was hemodynamically stable with R hemiparesis, NIH 4 but increased to 5, CBG 536, WBC 13.4, sodium 131, anion gap 16.  She was not given tpa due to late presentation.  CT head initially without acute abnormality, but MRI showed hypodensity in the left pons.  No LVO on MRA head/neck but did show potential aneurysm versus infundibulum in the proximal right A2.  Stroke team consulted.  Workup showed EF 65-70% with no shunt, LDL 180.  Pt was started on DAPT with clopidogrel and aspirin x3 weeks,  then aspirin alone.  Therapy evaluations were completed and pt was recommended for CIR.   Complete NIHSS TOTAL: 4  Patient's medical record from [facility] has been reviewed by the rehabilitation admission coordinator and physician.  Past Medical History  Past Medical History:  Diagnosis Date   Diabetes mellitus without complication (HCC)    Hyperlipidemia    Hypertension     Has the patient had major surgery during 100 days prior to admission? No  Family History   family history includes Arthritis in her mother; Asthma in her father; Breast cancer in her maternal grandmother and paternal grandmother; Diabetes in her maternal grandmother; Kidney disease in her mother.  Current Medications  Current Facility-Administered Medications:     stroke: early stages of recovery book, , Does not apply, Once, Howerter, Justin B, DO   acetaminophen (TYLENOL) tablet 650 mg, 650 mg, Oral, Q6H PRN **OR** acetaminophen (TYLENOL) suppository 650 mg, 650 mg, Rectal, Q6H PRN, Howerter, Justin B, DO   aspirin EC tablet 81 mg, 81 mg, Oral,  Daily, Marvel Plan, MD, 81 mg at 12/21/22 0954   clopidogrel (PLAVIX) tablet 75 mg, 75 mg, Oral, Daily, Marvel Plan, MD, 75 mg at 12/21/22 0954   insulin aspart (novoLOG) injection 0-15 Units, 0-15 Units, Subcutaneous, TID WC, Howerter, Justin B, DO, 5 Units at 12/21/22 1220   insulin aspart (novoLOG) injection 0-5 Units, 0-5 Units, Subcutaneous, QHS, Howerter, Justin B, DO, 2 Units at 12/20/22 2159   insulin aspart (novoLOG) injection 15 Units, 15 Units, Subcutaneous, TID WC, Pieter Partridge, MD, 15 Units at 12/21/22 1221   insulin glargine-yfgn (SEMGLEE) injection 20 Units, 20 Units, Subcutaneous, Daily, Leandro Reasoner Tublu, MD, 20 Units at 12/21/22 0955   melatonin tablet 3 mg, 3 mg, Oral, QHS PRN, Howerter, Justin B, DO   ondansetron (ZOFRAN) injection 4 mg, 4 mg, Intravenous, Q6H PRN, Howerter, Justin B, DO   rosuvastatin (CRESTOR) tablet 40 mg, 40  mg, Oral, Daily, Marvel Plan, MD, 40 mg at 12/21/22 5621  Patients Current Diet:  Diet Order             Diet Carb Modified Fluid consistency: Thin; Room service appropriate? Yes  Diet effective now                   Precautions / Restrictions Precautions Precautions: Fall Precaution Comments: R hemi, inattention Restrictions Weight Bearing Restrictions: No   Has the patient had 2 or more falls or a fall with injury in the past year? No  Prior Activity Level Community (5-7x/wk): independent without DME, driving, working FT as a Runner, broadcasting/film/video at [employer]  Prior Functional Level Self Care: Did the patient need help bathing, dressing, using the toilet or eating? Independent  Indoor Mobility: Did the patient need assistance with walking from room to room (with or without device)? Independent  Stairs: Did the patient need assistance with internal or external stairs (with or without device)? Independent  Functional Cognition: Did the patient need help planning regular tasks such as shopping or remembering to take medications? Independent  Patient Information Are you of Hispanic, Latino/a,or Spanish origin?: A. No, not of Hispanic, Latino/a, or Spanish origin What is your race?: B. Black or African American Do you need or want an interpreter to communicate with a doctor or health care staff?: 0. No  Patient's Response To:  Health Literacy and Transportation Is the patient able to respond to health literacy and transportation needs?: Yes Health Literacy - How often do you need to have someone help you when you read instructions, pamphlets, or other written material from your doctor or pharmacy?: Never In the past 12 months, has lack of transportation kept you from medical appointments or from getting medications?: No In the past 12 months, has lack of transportation kept you from meetings, work, or from getting things needed for daily living?: No  Journalist, newspaper /  Equipment Home Assistive Devices/Equipment: None Home Equipment: None  Prior Device Use: Indicate devices/aids used by the patient prior to current illness, exacerbation or injury? None of the above  Current Functional Level Cognition  Arousal/Alertness: Awake/alert Overall Cognitive Status: Impaired/Different from baseline Orientation Level: Oriented X4 Following Commands: Follows one step commands consistently Safety/Judgement: Decreased awareness of safety, Decreased awareness of deficits Attention: Sustained Sustained Attention: Impaired Sustained Attention Impairment: Verbal basic Memory: Impaired Memory Impairment: Retrieval deficit Awareness: Impaired Awareness Impairment: Emergent impairment Problem Solving: Impaired Problem Solving Impairment: Verbal basic, Functional basic    Extremity Assessment (includes Sensation/Coordination)  Upper Extremity Assessment: Defer to OT evaluation RUE  Deficits / Details: Pt with minimal controlled use of RUE for functional tasks--has movement at all joints (decreased strength and coordination RUE Coordination: decreased fine motor, decreased gross motor  Lower Extremity Assessment: RLE deficits/detail RLE Deficits / Details: weakness, soft buckling, poor heel/toe patterning. RLE Coordination: decreased fine motor    ADLs  Overall ADL's : Needs assistance/impaired Eating/Feeding: Modified independent, Sitting Grooming: Minimal assistance, Sitting Upper Body Bathing: Minimal assistance, Sitting Lower Body Bathing: Moderate assistance Lower Body Bathing Details (indicate cue type and reason): min A sit<>stand Upper Body Dressing : Minimal assistance, Sitting Lower Body Dressing: Moderate assistance Lower Body Dressing Details (indicate cue type and reason): min A sit<>stand Toilet Transfer: Minimal assistance, Stand-pivot Toileting- Clothing Manipulation and Hygiene: Moderate assistance Toileting - Clothing Manipulation Details  (indicate cue type and reason): min A sit<>stand    Mobility  Overal bed mobility: Needs Assistance Bed Mobility: Supine to Sit, Sit to Supine, Rolling Rolling: Supervision, Used rails (roll L) Supine to sit: Supervision, Used rails, HOB elevated Sit to supine: Supervision, Used rails, HOB elevated General bed mobility comments: extra time to elevate BLE onto bed, cuing to reposition to straight in bed, cuing to use bed rails to increase ease of movement    Transfers  Overall transfer level: Needs assistance Equipment used: 1 person hand held assist, Rolling walker (2 wheels) Transfers: Sit to/from Stand Sit to Stand: Min assist Bed to/from chair/wheelchair/BSC transfer type:: Stand pivot Stand pivot transfers: Min assist General transfer comment: Cuing re: hand placement when transferring STS with RW.    Ambulation / Gait / Stairs / Wheelchair Mobility  Ambulation/Gait Ambulation/Gait assistance: Mod assist Gait Distance (Feet): 25 Feet (+ 75 ft) Assistive device: None, Rolling walker (2 wheels) Gait Pattern/deviations: Decreased step length - right, Decreased step length - left, Decreased dorsiflexion - right, Decreased dorsiflexion - left, Decreased stride length, Decreased weight shift to right General Gait Details: Pt intially ambulates bed<>door without AD with mod assist with significantly increased weight shifting to LLE to advance RLE. Pt with poor R hip/knee flexion during swing phase, absent dorsiflexion & heel strike. Pt with multiple instances of R knee buckling & LOB requiring mod assist to correct with pt demonstrating decreased awareness. PT then provided pt with RW & pt ambulates into hallway with PT providing assistance to maintain RUE hand on RW. Pt internally & externally distracted throughout mobility, demonstrating need for increased safety & focus on gait with RW. Pt with ongoing R knee buckling although not as much as compared to gait without AD. Pt with decreased  awareness of managing RW. Gait velocity: decreased Gait velocity interpretation: <1.8 ft/sec, indicate of risk for recurrent falls    Posture / Balance Dynamic Sitting Balance Sitting balance - Comments: supervision static sitting Balance Overall balance assessment: Needs assistance Sitting-balance support: No upper extremity supported, Feet supported Sitting balance-Leahy Scale: Fair Sitting balance - Comments: supervision static sitting Standing balance support: No upper extremity supported, During functional activity, Bilateral upper extremity supported, Reliant on assistive device for balance Standing balance-Leahy Scale: Poor Standing balance comment: reliant on external support.    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; fostering an 31 yo boy that is related to her (pt's father is taking care of him at present)  Discharge Living Setting Plans for Discharge Living Setting: Patient's home, Lives with (comment) (dad/step mom will stay with her) Type of Home at Discharge: Apartment Discharge Home Layout: One level Discharge Home Access: Stairs to enter Entrance Stairs-Rails: Can reach both Entrance Stairs-Number of Steps: 20 Discharge Bathroom Shower/Tub: Tub/shower unit Discharge Bathroom Toilet: Standard Discharge Bathroom Accessibility: Yes How Accessible: Accessible via walker Does the patient have any problems obtaining your medications?: No  Social/Family/Support Systems Patient Roles:  Caregiver (providing care for an 64 y/o foster child, pt's dad is currently caring for him) Anticipated Caregiver: Judye Bos (dad) Anticipated Caregiver's Contact Information: [phone] Ability/Limitations of Caregiver: none stated Caregiver Availability: 24/7 Discharge Plan Discussed with Primary Caregiver: Yes Is Caregiver In Agreement with Plan?: Yes Does Caregiver/Family have Issues with Lodging/Transportation while Pt is in Rehab?: No  Goals Patient/Family Goal for Rehab: PT/OT/SLP supervision to mod I Expected length of stay: 12-14 days Additional Information: Discharge plan: return to either pt's home (second floor apartment) or dad's home (one level with a few steps to enter).  Pt's dad and step mom will provide 24/7 supervision to CGA if needed Pt/Family Agrees to Admission and willing to participate: Yes Program Orientation Provided & Reviewed with Pt/Caregiver Including Roles  & Responsibilities: Yes  Barriers to Discharge: Insurance for SNF coverage, Home environment access/layout  Decrease burden of Care through IP rehab admission: n/a  Possible need for SNF placement upon discharge:  not anticipated.  Plan for discharge home with dad and step mother providing 24/7 up to CGA if needed.  Can discharge to her apartment (second floor) or parents home which is one level and fewer stairs to access.   Patient Condition: I have reviewed medical records from [facility], spoken with CSW, and patient and family member. I met with patient at the bedside for inpatient rehabilitation assessment.  Patient will benefit from ongoing PT, OT, and SLP, can actively participate in 3 hours of therapy a day 5 days of the week, and can make measurable gains during the admission.  Patient will also benefit from the coordinated team approach during an Inpatient Acute Rehabilitation admission.  The patient will receive intensive therapy as well as Rehabilitation physician, nursing, social worker, and  care management interventions.  Due to safety, skin/wound care, disease management, medication administration, pain management, and patient education the patient requires 24 hour a day rehabilitation nursing.  The patient is currently min to mod assist with mobility and basic ADLs.  Discharge setting and therapy post discharge at home with home health is anticipated.  Patient has agreed to participate in the Acute Inpatient Rehabilitation Program and will admit pending insurance approval ***.  Preadmission Screen Completed By:  Stephania Fragmin, PT, DPT 12/21/2022 1:31 PM ______________________________________________________________________   Discussed status with Dr. Marland Kitchen on *** at *** and received approval for admission today.  Admission Coordinator:  Stephania Fragmin, PT, time Marland KitchenDorna Bloom ***   Assessment/Plan: Diagnosis: Does the need for close, 24 hr/day Medical supervision in concert with the patient's rehab needs make it unreasonable for this patient to be served in a less intensive setting? {yes_no_potentially:3041433} Co-Morbidities requiring supervision/potential complications: *** Due to {due EP:3295188}, does the patient require 24 hr/day rehab nursing? {yes_no_potentially:3041433} Does the patient require coordinated care of a physician, rehab nurse, PT, OT, and SLP to address physical and functional deficits in the context of the above  medical diagnosis(es)? {yes_no_potentially:3041433} Addressing deficits in the following areas: {deficits:3041436} Can the patient actively participate in an intensive therapy program of at least 3 hrs of therapy 5 days a week? {yes_no_potentially:3041433} The potential for patient to make measurable gains while on inpatient rehab is {potential:3041437} Anticipated functional outcomes upon discharge from inpatient rehab: {functional outcomes:304600100} PT, {functional outcomes:304600100} OT, {functional outcomes:304600100} SLP Estimated rehab length of stay  to reach the above functional goals is: *** Anticipated discharge destination: {anticipated dc setting:21604} 10. Overall Rehab/Functional Prognosis: {potential:3041437}   MD Signature: ***

## 2022-12-21 NOTE — Progress Notes (Signed)
Physical Therapy Treatment Patient Details Name: Lisa Werner MRN: [id number] DOB: May 13, 1980 Today's Date: 12/21/2022   History of Present Illness Lisa Werner is a 42 y.o. female presented from home to [facility] ED complaining of right-sided weakness. 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 PHMx: diabetes mellitus 2, hypertension, hyperlipidemia.    PT Comments  Pt seen for PT tx with pt agreeable. Session focused on gait training without AD and attempts with RW. Pt with multiple instances of R knee buckling when ambulating without AD, poor overall awareness & decreased safety awareness when ambulating with RW. Pt presents with R inattention requiring cuing/assist throughout session. Pt is impulsive with mobility & currently a high fall risk. Recommend ongoing PT services to progress mobility as able.    If plan is discharge home, recommend the following: Assistance with cooking/housework;Help with stairs or ramp for entrance;A lot of help with walking and/or transfers;A lot of help with bathing/dressing/bathroom;Assist for transportation   Can travel by private vehicle        Equipment Recommendations   (defer to next venue)    Recommendations for Other Services Rehab consult     Precautions / Restrictions Precautions Precautions: Fall Precaution Comments: R hemi, inattention Restrictions Weight Bearing Restrictions: No     Mobility  Bed Mobility Overal bed mobility: Needs Assistance Bed Mobility: Supine to Sit, Sit to Supine, Rolling Rolling: Supervision, Used rails (roll L)   Supine to sit: Supervision, Used rails, HOB elevated Sit to supine: Supervision, Used rails, HOB elevated   General bed mobility comments: extra time to elevate BLE onto bed, cuing to reposition to straight in bed, cuing to use bed rails to increase ease of movement    Transfers Overall transfer level: Needs assistance Equipment used: 1 person hand held assist,  Rolling walker (2 wheels) Transfers: Sit to/from Stand Sit to Stand: Min assist           General transfer comment: Cuing re: hand placement when transferring STS with RW.    Ambulation/Gait Ambulation/Gait assistance: Mod assist Gait Distance (Feet): 25 Feet (+ 75 ft) Assistive device: None, Rolling walker (2 wheels) Gait Pattern/deviations: Decreased step length - right, Decreased step length - left, Decreased dorsiflexion - right, Decreased dorsiflexion - left, Decreased stride length, Decreased weight shift to right Gait velocity: decreased     General Gait Details: Pt intially ambulates bed<>door without AD with mod assist with significantly increased weight shifting to LLE to advance RLE. Pt with poor R hip/knee flexion during swing phase, absent dorsiflexion & heel strike. Pt with multiple instances of R knee buckling & LOB requiring mod assist to correct with pt demonstrating decreased awareness. PT then provided pt with RW & pt ambulates into hallway with PT providing assistance to maintain RUE hand on RW. Pt internally & externally distracted throughout mobility, demonstrating need for increased safety & focus on gait with RW. Pt with ongoing R knee buckling although not as much as compared to gait without AD. Pt with decreased awareness of managing RW.   Stairs             Wheelchair Mobility     Tilt Bed    Modified Rankin (Stroke Patients Only)       Balance Overall balance assessment: Needs assistance Sitting-balance support: No upper extremity supported, Feet supported Sitting balance-Leahy Scale: Fair Sitting balance - Comments: supervision static sitting   Standing balance support: No upper extremity supported, During functional activity, Bilateral upper extremity supported, Reliant  on assistive device for balance Standing balance-Leahy Scale: Poor                              Cognition Arousal: Alert Behavior During Therapy:  Impulsive Overall Cognitive Status: Impaired/Different from baseline Area of Impairment: Attention, Memory, Following commands, Safety/judgement, Awareness, Problem solving                     Memory: Decreased short-term memory Following Commands: Follows one step commands consistently Safety/Judgement: Decreased awareness of safety, Decreased awareness of deficits Awareness: Emergent Problem Solving: Slow processing, Difficulty sequencing, Requires verbal cues, Requires tactile cues          Exercises Other Exercises Other Exercises: Pt performed 5x STS x 2 sets from EOB without BUE support with cuing to scoot to edge of seat to reduce posterior lean back onto bed with BLE. Pt with poor overall awareness & poor safety awareness, decreased balance, and poor eccentric control with stand>sit despite ongoing cuing for safety.    General Comments General comments (skin integrity, edema, etc.): Pt declined grooming tasks standing at sink, noting she'd like to finish her breakfast. PT educated/encouraged pt to use RUE in functional context as much as possible; pt opened cereal container with RUE with extra time.      Pertinent Vitals/Pain Pain Assessment Pain Assessment: No/denies pain    Home Living     Available Help at Discharge: Family;Available PRN/intermittently Type of Home: Apartment                  Prior Function            PT Goals (current goals can now be found in the care plan section) Acute Rehab PT Goals Patient Stated Goal: back to work as soon as possible. PT Goal Formulation: With patient Time For Goal Achievement: 01/03/23 Potential to Achieve Goals: Good Progress towards PT goals: Progressing toward goals    Frequency    Min 1X/week      PT Plan      Co-evaluation              AM-PAC PT "6 Clicks" Mobility   Outcome Measure  Help needed turning from your back to your side while in a flat bed without using bedrails?: A  Little Help needed moving from lying on your back to sitting on the side of a flat bed without using bedrails?: A Little Help needed moving to and from a bed to a chair (including a wheelchair)?: A Little Help needed standing up from a chair using your arms (e.g., wheelchair or bedside chair)?: A Little Help needed to walk in hospital room?: A Lot Help needed climbing 3-5 steps with a railing? : A Lot 6 Click Score: 16    End of Session Equipment Utilized During Treatment: Gait belt Activity Tolerance: Patient tolerated treatment well Patient left: in bed;with call bell/phone within reach;with bed alarm set;with nursing/sitter in room Nurse Communication: Mobility status PT Visit Diagnosis: Unsteadiness on feet (R26.81);Other abnormalities of gait and mobility (R26.89);Muscle weakness (generalized) (M62.81);Hemiplegia and hemiparesis Hemiplegia - Right/Left: Right Hemiplegia - dominant/non-dominant: Dominant Hemiplegia - caused by: Cerebral infarction     Time: 1031-1046 PT Time Calculation (min) (ACUTE ONLY): 15 min  Charges:    $Neuromuscular Re-education: 8-22 mins PT General Charges $$ ACUTE PT VISIT: 1 Visit  Aleda Grana, PT, DPT 12/21/22, 11:05 AM   Sandi Mariscal 12/21/2022, 11:03 AM

## 2022-12-21 NOTE — Hospital Course (Signed)
42 year old female with past medical history of hypertension, poorly controlled diabetes, hyperlipidemia presented to hospital with 4-day history of right-sided weakness and hyperglycemia.  MRI was done which showed left pontine stroke.  Patient was then [facility] for further evaluation and treatment.  Assessment & Plan:   Subacute left pontine stroke Neurology on board.  Continue aspirin Plavix and statins.  2D echocardiogram with bubble study shows normal echocardiogram without any interatrial shunt.  Hemoglobin A1c pending.  Hypercoagulable workup pending. MRI with acute pontine infarct, MRI without large vessel stenosis, PT OT recommend CIR.   DM2, poorly controlled Pending hemoglobin A1c.  Continue glargine 20 units.  Will need compliance with insulin on discharge.   HTN BP well-controlled on HCTZ and lisinopril    Dyslipidemia Continue Crestor.   Leukocytosis Chronic for 5 years.  WBC at 11.6.

## 2022-12-21 NOTE — Plan of Care (Signed)

## 2022-12-21 NOTE — TOC CM/SW Note (Signed)
Transition of Care [facility]) - Inpatient Brief Assessment   Patient Details  Name: Karrigan Sugawara MRN: [id number] Date of Birth: [birth year]  Transition of Care [facility]) CM/SW Contact:    Baldemar Lenis, LCSW Phone Number: 12/21/2022, 4:15 PM   Clinical Narrative:   Patient from home, noting recommendation for CIR, waiting on insurance authorization. CSW to follow for disposition.    Transition of Care Asessment: Insurance and Status: Insurance coverage has been reviewed Patient has primary care physician: Yes Home environment has been reviewed: Home Prior level of function:: Independent Prior/Current Home Services: No current home services Social Determinants of Health Reivew: SDOH reviewed no interventions necessary Readmission risk has been reviewed: Yes Transition of care needs: transition of care needs identified, TOC will continue to follow

## 2022-12-21 NOTE — Evaluation (Signed)
Speech Language Pathology Evaluation Patient Details Name: Lisa Werner MRN: [id number] DOB: 1980-07-21 Today's Date: 12/21/2022 Time: 1019-1030 SLP Time Calculation (min) (ACUTE ONLY): 11 min  Problem List:  Patient Active Problem List   Diagnosis Date Noted   CVA (cerebral vascular accident) (HCC) 12/20/2022   Acute ischemic stroke (HCC) 12/19/2022   Leukocytosis 12/19/2022   Acute right-sided weakness 12/19/2022   Essential hypertension 02/21/2019   Noncompliance with diabetes treatment 07/17/2017   Mixed hyperlipidemia 06/03/2017   Pain of left eye 06/03/2017   Urinary frequency 06/03/2017   DM2 (diabetes mellitus, type 2) (HCC) 09/28/2013   Past Medical History:  Past Medical History:  Diagnosis Date   Diabetes mellitus without complication (HCC)    Hyperlipidemia    Hypertension    Past Surgical History: History reviewed. No pertinent surgical history. HPI:  Lisa Werner is a 42 yo female presenting to ED 8/21 complaining of R sided weakness. MRI Brain with acute infarct in L pons. PMH includes T2DM, HTN, HLD   Assessment / Plan / Recommendation Clinical Impression  Pt reports living at home with her 79 yo foster son and working as a Clinical cytogeneticist. She is independent at baseline. Pt reports feeling that her voice is "slurred", but has no other acute concerns with cognition. Pt presents with mild dysarthria, which minimally affects intelligibility. Due to R sided deficits, pt unable to hold a pen to complete the clock drawing portion of the evaluation. She scored a 21 out of the avaliable 26 points on the SLUMS characterized by difficulties with sustained attention and problem solving. SLP observed pt throughout the course of eating her breakfast with limited awareness of R sided deficits, attempting to compensate by eating her omelette with her L hand without using a fork. Suspect pt may be experiencing acute changes with complex cognitive  tasks. Recommend continued SLP f/u to address deficits and ensure return to PLOF. Will continue to follow.    SLP Assessment  SLP Recommendation/Assessment: Patient needs continued Speech Lanaguage Pathology Services SLP Visit Diagnosis: Dysarthria and anarthria (R47.1);Cognitive communication deficit (R41.841)    Recommendations for follow up therapy are one component of a multi-disciplinary discharge planning process, led by the attending physician.  Recommendations may be updated based on patient status, additional functional criteria and insurance authorization.    Follow Up Recommendations  Acute inpatient rehab (3hours/day)    Assistance Recommended at Discharge  Intermittent Supervision/Assistance  Functional Status Assessment Patient has had a recent decline in their functional status and demonstrates the ability to make significant improvements in function in a reasonable and predictable amount of time.  Frequency and Duration min 2x/week  1 week      SLP Evaluation Cognition  Overall Cognitive Status: Impaired/Different from baseline Arousal/Alertness: Awake/alert Orientation Level: Oriented X4 Attention: Sustained Sustained Attention: Impaired Sustained Attention Impairment: Verbal basic Memory: Impaired Memory Impairment: Retrieval deficit Awareness: Impaired Awareness Impairment: Emergent impairment Problem Solving: Impaired Problem Solving Impairment: Verbal basic;Functional basic       Comprehension  Auditory Comprehension Overall Auditory Comprehension: Appears within functional limits for tasks assessed    Expression Expression Primary Mode of Expression: Verbal Verbal Expression Overall Verbal Expression: Appears within functional limits for tasks assessed Written Expression Dominant Hand: Right   Oral / Motor  Oral Motor/Sensory Function Overall Oral Motor/Sensory Function: Within functional limits Motor Speech Overall Motor Speech:  Impaired Articulation: Impaired Level of Impairment: Conversation Intelligibility: Intelligibility reduced Conversation: 75-100% accurate  Gwynneth Aliment, M.A., CF-SLP Speech Language Pathology, Acute Rehabilitation Services  Secure Chat preferred [phone]  12/21/2022, 10:51 AM

## 2022-12-21 NOTE — Inpatient Diabetes Management (Signed)
Inpatient Diabetes Program Recommendations  AACE/ADA: New Consensus Statement on Inpatient Glycemic Control (2015)  Target Ranges:  Prepandial:   less than 140 mg/dL      Peak postprandial:   less than 180 mg/dL (1-2 hours)      Critically ill patients:  140 - 180 mg/dL   Lab Results  Component Value Date   GLUCAP 231 (H) 12/21/2022   HGBA1C 12.7 (H) 03/04/2019    Review of Glycemic Control  Latest Reference Range & Units 12/20/22 15:43 12/20/22 21:12 12/21/22 06:11 12/21/22 12:01  Glucose-Capillary 70 - 99 mg/dL 161 (H) 096 (H) 045 (H) 231 (H)   Diabetes history: DM - LADA (Dx. In 2008) Outpatient Diabetes medications:  Tresiba 20 units daily Novolog 15 units tid with meals Dexcom G7 Current orders for Inpatient glycemic control:  Novolog 0-15 units tid with meals and HS Novolog 15 units tid with meals Semglee 20 units daily  Inpatient Diabetes Program Recommendations:    Referral received.  Visited with patient today regarding DM management and CGM.  She see's [facility] and last visit was 10/29/22.  At that visit medications were adjusted and patient was to f/u.  Last A1C was 12.5%.   She does have Dexcom sensors at home but states she stopped wearing it b/c "it beeped too much".  I explained that the alarms are important.  She said that they constantly said "Hi".  Explained that she likely still needs more adjustment in medications and that she needs to wear sensor for safety.  Also told patient that "high" alarms need to be reported to her Endocrine MD and that high blood sugars can pre-dispose patient to complications such as stroke.  Patient kept eyes closed most of my visit but verbalized her understanding of information.    -May consider increasing Semglee to 30 units daily.   Thanks,  Beryl Meager, RN, BC-ADM Inpatient Diabetes Coordinator Pager [phone] (8a-5p)

## 2022-12-21 NOTE — Progress Notes (Addendum)
PROGRESS NOTE    Lisa Werner  YQI:[id number] DOB: 1980-10-03 DOA: 12/19/2022 PCP: Julieanne Manson, MD    Brief Narrative:  42 year old female with past medical history of hypertension, poorly controlled diabetes, hyperlipidemia presented to hospital with 4-day history of right-sided weakness and hyperglycemia.  MRI was done which showed left pontine stroke.  Patient was then [facility] for further evaluation and treatment.  Assessment & Plan:   Subacute left pontine stroke Neurology on board.  Continue aspirin Plavix and statins.  2D echocardiogram with bubble study shows normal echocardiogram without any interatrial shunt.  Hemoglobin A1c pending.  Hypercoagulable workup pending. MRI with acute pontine infarct, MRI without large vessel stenosis, PT OT recommend CIR.   DM2, poorly controlled Pending hemoglobin A1c.  Continue glargine 20 units.  Will need compliance with insulin on discharge.   HTN BP well-controlled on HCTZ and lisinopril    Dyslipidemia Continue Crestor.   Leukocytosis Chronic for 5 years.  WBC at 11.6.     DVT prophylaxis: SCDs Start: 12/19/22 1952   Code Status:     Code Status: Full Code  Disposition: CIR as per PT evaluation.  Medically stable for disposition.  Status is: Inpatient Remains inpatient appropriate because: Need for inpatient rehabilitation   Family Communication: None at bedside  Consultants:  Neurology  Procedures:  None  Antimicrobials:  None  Anti-infectives (From admission, onward)    None       Subjective: Today, patient was seen and examined at bedside.  Patient states that she feels okay.  Denies any shortness of breath chest pain dizziness lightheadedness.  Has not had a bowel movement in 2 days.  Still has some weakness on the right side.  Objective: Vitals:   12/20/22 2309 12/21/22 0308 12/21/22 0500 12/21/22 0726  BP: 126/77 (!) 150/92  122/72  Pulse: 88 78    Resp: 18 18  18   Temp:  98.2 F (36.8 C) 98.4 F (36.9 C)  98.2 F (36.8 C)  TempSrc: Oral Oral  Oral  SpO2: 97% 98%  99%  Weight:   99.5 kg   Height:       No intake or output data in the 24 hours ending 12/21/22 1018 Filed Weights   12/19/22 1122 12/21/22 0500  Weight: 90.7 kg 99.5 kg    Physical Examination: Body mass index is 40.12 kg/m.  General: Obese built, not in obvious distress HENT:   No scleral pallor or icterus noted. Oral mucosa is moist.  Chest:  Clear breath sounds.  No crackles or wheezes.  CVS: S1 &S2 heard. No murmur.  Regular rate and rhythm. Abdomen: Soft, nontender, nondistended.  Bowel sounds are heard.   Extremities: No cyanosis, clubbing or edema.  Peripheral pulses are palpable. Psych: Alert, awake and oriented, flat affect CNS:  No cranial nerve deficits.  Weakness of the right side. Skin: Warm and dry.  No rashes noted.  Data Reviewed:   CBC: Recent Labs  Lab 12/19/22 1136 12/19/22 1258 12/20/22 0133  WBC 13.4*  --  11.6*  NEUTROABS 11.0*  --  7.6  HGB 13.0 13.6 13.2  HCT 40.3 40.0 41.8  MCV 90.8  --  89.5  PLT 292  --  295    Basic Metabolic Panel: Recent Labs  Lab 12/19/22 1136 12/19/22 1258 12/20/22 0133  NA 131* 130* 133*  K 4.4 4.3 3.5  CL 95*  --  101  CO2 20*  --  21*  GLUCOSE 402*  --  279*  BUN 9  --  7  CREATININE 1.00  --  0.80  CALCIUM 9.2  --  8.8*  MG 1.7  --  1.9    Liver Function Tests: Recent Labs  Lab 12/19/22 1136 12/20/22 0133  AST 22 24  ALT 23 21  ALKPHOS 94 87  BILITOT 0.8 0.7  PROT 7.1 7.1  ALBUMIN 3.0* 2.8*     Radiology Studies: ECHOCARDIOGRAM COMPLETE BUBBLE STUDY  Result Date: 12/20/2022    ECHOCARDIOGRAM REPORT   Patient Name:   Lisa Werner [facility] Date of Exam: 12/20/2022 Medical Rec #:  [id number]               Height:       62.0 in Accession #:    [id number]              Weight:       200.0 lb Date of Birth:  December 10, 1980              BSA:          1.912 m Patient Age:    41 years                BP:            110/76 mmHg Patient Gender: F                       HR:           88 bpm. Exam Location:  Inpatient Procedure: 2D Echo, Cardiac Doppler, Color Doppler and Strain Analysis Indications:    Stroke  History:        Patient has no prior history of Echocardiogram examinations.                 Risk Factors:Hypertension, Diabetes and Dyslipidemia.  Sonographer:    Sheralyn Boatman RDCS Referring Phys: [id number] Angie Fava  Sonographer Comments: Patient is obese. Image acquisition challenging due to patient body habitus. IMPRESSIONS  1. Left ventricular ejection fraction, by estimation, is 65 to 70%. The left ventricle has normal function. The left ventricle has no regional wall motion abnormalities. There is moderate left ventricular hypertrophy. Left ventricular diastolic parameters were normal. The average left ventricular global longitudinal strain is -22.0 %. The global longitudinal strain is normal.  2. Right ventricular systolic function is normal. The right ventricular size is normal.  3. The mitral valve is normal in structure. Trivial mitral valve regurgitation. No evidence of mitral stenosis.  4. The aortic valve is tricuspid. Aortic valve regurgitation is not visualized. No aortic stenosis is present.  5. Agitated saline contrast bubble study was negative, with no evidence of any interatrial shunt. Comparison(s): No prior Echocardiogram. Conclusion(s)/Recommendation(s): Otherwise normal echocardiogram, with minor abnormalities described in the report. FINDINGS  Left Ventricle: Left ventricular ejection fraction, by estimation, is 65 to 70%. The left ventricle has normal function. The left ventricle has no regional wall motion abnormalities. The average left ventricular global longitudinal strain is -22.0 %. The global longitudinal strain is normal. The left ventricular internal cavity size was small. There is moderate left ventricular hypertrophy. Left ventricular diastolic parameters were normal. Right  Ventricle: The right ventricular size is normal. No increase in right ventricular wall thickness. Right ventricular systolic function is normal. Left Atrium: Left atrial size was normal in size. Right Atrium: Right atrial size was normal in size. Pericardium: There is no evidence of pericardial effusion. Mitral Valve: The mitral valve is normal in  structure. Trivial mitral valve regurgitation. No evidence of mitral valve stenosis. Tricuspid Valve: The tricuspid valve is normal in structure. Tricuspid valve regurgitation is trivial. No evidence of tricuspid stenosis. Aortic Valve: The aortic valve is tricuspid. Aortic valve regurgitation is not visualized. No aortic stenosis is present. Pulmonic Valve: The pulmonic valve was grossly normal. Pulmonic valve regurgitation is not visualized. No evidence of pulmonic stenosis. Aorta: The aortic root, ascending aorta, aortic arch and descending aorta are all structurally normal, with no evidence of dilitation or obstruction. Venous: The inferior vena cava was not well visualized. IAS/Shunts: No atrial level shunt detected by color flow Doppler. Agitated saline contrast was given intravenously to evaluate for intracardiac shunting. Agitated saline contrast bubble study was negative, with no evidence of any interatrial shunt.  LEFT VENTRICLE PLAX 2D LVIDd:         3.35 cm     Diastology LVIDs:         2.10 cm     LV e' medial:    5.55 cm/s LV PW:         1.55 cm     LV E/e' medial:  12.4 LV IVS:        1.55 cm     LV e' lateral:   5.87 cm/s LVOT diam:     2.30 cm     LV E/e' lateral: 11.7 LV SV:         68 LV SV Index:   35          2D Longitudinal Strain LVOT Area:     4.15 cm    2D Strain GLS (A2C):   -24.5 %                            2D Strain GLS (A3C):   -17.6 %                            2D Strain GLS (A4C):   -23.9 % LV Volumes (MOD)           2D Strain GLS Avg:     -22.0 % LV vol d, MOD A2C: 58.1 ml LV vol d, MOD A4C: 60.2 ml LV vol s, MOD A2C: 16.8 ml LV vol s, MOD  A4C: 16.7 ml LV SV MOD A2C:     41.3 ml LV SV MOD A4C:     60.2 ml LV SV MOD BP:      42.3 ml RIGHT VENTRICLE            IVC RV S prime:     8.92 cm/s  IVC diam: 2.00 cm TAPSE (M-mode): 1.5 cm LEFT ATRIUM             Index        RIGHT ATRIUM           Index LA diam:        3.50 cm 1.83 cm/m   RA Area:     11.50 cm LA Vol (A2C):   28.3 ml 14.80 ml/m  RA Volume:   22.80 ml  11.93 ml/m LA Vol (A4C):   32.2 ml 16.84 ml/m LA Biplane Vol: 30.3 ml 15.85 ml/m  AORTIC VALVE LVOT Vmax:   88.10 cm/s LVOT Vmean:  60.200 cm/s LVOT VTI:    0.163 m  AORTA Ao Root diam: 3.00 cm Ao Asc diam:  2.80 cm MITRAL VALVE MV Area (  PHT): 4.54 cm    SHUNTS MV Decel Time: 167 msec    Systemic VTI:  0.16 m MV E velocity: 68.55 cm/s  Systemic Diam: 2.30 cm MV A velocity: 61.70 cm/s MV E/A ratio:  1.11 Jodelle Red MD Electronically signed by Jodelle Red MD Signature Date/Time: 12/20/2022/12:03:25 PM    Final    CT ANGIO HEAD NECK W WO CM  Result Date: 12/19/2022 CLINICAL DATA:  Stroke on MRI, determine embolic source EXAM: CT ANGIOGRAPHY HEAD AND NECK WITH AND WITHOUT CONTRAST TECHNIQUE: Multidetector CT imaging of the head and neck was performed using the standard protocol during bolus administration of intravenous contrast. Multiplanar CT image reconstructions and MIPs were obtained to evaluate the vascular anatomy. Carotid stenosis measurements (when applicable) are obtained utilizing NASCET criteria, using the distal internal carotid diameter as the denominator. RADIATION DOSE REDUCTION: This exam was performed according to the departmental dose-optimization program which includes automated exposure control, adjustment of the mA and/or kV according to patient size and/or use of iterative reconstruction technique. CONTRAST:  75mL OMNIPAQUE IOHEXOL 350 MG/ML SOLN COMPARISON:  12/19/2022 CT head, MRI head, and MRA head, no prior CTA FINDINGS: CT HEAD FINDINGS For noncontrast findings, please see same day CT head.  CTA NECK FINDINGS Aortic arch: Two-vessel arch with a common origin of the brachiocephalic and left common carotid arteries. Imaged portion shows no evidence of aneurysm or dissection. No significant stenosis of the major arch vessel origins. Right carotid system: No evidence of dissection, occlusion, or hemodynamically significant stenosis (greater than 50%). Atherosclerotic disease at the bifurcation and in the proximal ICA is not hemodynamically significant. Left carotid system: No evidence of dissection, occlusion, or hemodynamically significant stenosis (greater than 50%). Atherosclerotic disease at the bifurcation and in the proximal ICA is not hemodynamically significant. Vertebral arteries: Right dominant system. The left vertebral artery is patent to the skull base, with areas of caliber change in the proximal left V2 segment (series 7, image 241 an series 9, image 109 and 113), concerning for dissection. The right vertebral artery is dominant and patent to the skull base without evidence of dissection or significant stenosis. Skeleton: No acute osseous abnormality. Degenerative changes in the cervical spine. Other neck: No acute finding. Upper chest: No focal pulmonary opacity or pleural effusion. Review of the MIP images confirms the above findings CTA HEAD FINDINGS Anterior circulation: Both internal carotid arteries are patent to the termini, with mild stenosis in the distal right supraclinoid ICA (series 7, image 107 A1 segments patent. Normal anterior communicating artery. Anterior cerebral arteries are patent to their distal aspects without significant stenosis. No M1 stenosis or occlusion. MCA branches perfused to their distal aspects without significant stenosis. Posterior circulation: Vertebral arteries patent to the vertebrobasilar junction without significant stenosis. Posterior inferior cerebellar arteries patent proximally. Basilar patent to its distal aspect without significant stenosis.  Superior cerebellar arteries patent proximally. Patent P1 segments. PCAs perfused to their distal aspects without significant stenosis. The right posterior communicating artery is patent. Venous sinuses: As permitted by contrast timing, patent. Anatomic variants: None significant. Review of the MIP images confirms the above findings IMPRESSION: 1. Areas of caliber change in the proximal left V2 segment, concerning for dissection. 2. No intracranial large vessel occlusion. Mild stenosis in the distal right supraclinoid ICA. 3. No hemodynamically significant stenosis in the neck. Imaging results were communicated on 12/19/2022 at 11:47 pm to provider Dr. Wilford Corner via secure text paging. Electronically Signed   By: Wiliam Ke M.D.   On:  12/19/2022 23:48   MR BRAIN WO CONTRAST  Result Date: 12/19/2022 CLINICAL DATA:  Stroke suspected EXAM: MRI HEAD WITHOUT CONTRAST MRA HEAD WITHOUT CONTRAST TECHNIQUE: Multiplanar, multi-echo pulse sequences of the brain and surrounding structures were acquired without intravenous contrast. Angiographic images of the Circle of Willis were acquired using MRA technique without intravenous contrast. COMPARISON:  No prior MRI available, correlation is made with CT head 12/19/2022 FINDINGS: MRI HEAD FINDINGS Brain: Restricted diffusion with ADC correlate in the left pons (series 5, images 73-76), which measures up to 1.5 x 1.2 x 0.9 cm. This area is associated with increased T2 hyperintense signal, likely cytotoxic edema. No acute hemorrhage, mass, mass effect, or midline shift. No hydrocephalus or extra-axial collection. Normal pituitary and craniocervical junction. Hemosiderin deposition in the right thalamus and cerebral peduncle, likely sequela of prior hypertensive microhemorrhage. Vascular: Please see MRA findings below. Skull and upper cervical spine: Normal marrow signal. Sinuses/Orbits: Mucosal thickening in the ethmoid air cells. No acute finding in the orbits. Other: The mastoid  air cells are well aerated. MRA HEAD FINDINGS Anterior circulation: Both internal carotid arteries are patent to the termini, without significant stenosis. A1 segments patent. Normal anterior communicating artery. Anterior cerebral arteries are patent to their distal aspects without significant stenosis. 1-2 mm laterally directed outpouching from the proximal right A2 (series 9, image 111), which may represent a tiny aneurysm versus an infundibulum. No M1 stenosis or occlusion. Distal MCA branches perfused to their distal aspects without significant stenosis. Posterior circulation: Right dominant system, with relatively large right vertebral and basilar arteries, which are otherwise unremarkable; no evidence of aneurysmal dilatation or significant tortuosity. Vertebral arteries patent to the vertebrobasilar junction without stenosis. Basilar patent to its distal aspect. Superior cerebellar arteries patent proximally. Patent P1 segments. PCAs perfused to their distal aspects without significant stenosis. The right posterior communicating artery is patent. Anatomic variants: None significant IMPRESSION: 1. Acute infarct in the left pons. 2. No intracranial large vessel occlusion or significant stenosis. 3. 1-2 mm laterally directed outpouching from the proximal right A2, which may represent a tiny aneurysm versus an infundibulum. These results were called by telephone at the time of interpretation on 12/19/2022 at 8:25 pm to provider [facility], who verbally acknowledged these results. Electronically Signed   By: Wiliam Ke M.D.   On: 12/19/2022 20:25   MR ANGIO HEAD WO CONTRAST  Result Date: 12/19/2022 CLINICAL DATA:  Stroke suspected EXAM: MRI HEAD WITHOUT CONTRAST MRA HEAD WITHOUT CONTRAST TECHNIQUE: Multiplanar, multi-echo pulse sequences of the brain and surrounding structures were acquired without intravenous contrast. Angiographic images of the Circle of Willis were acquired using MRA technique without  intravenous contrast. COMPARISON:  No prior MRI available, correlation is made with CT head 12/19/2022 FINDINGS: MRI HEAD FINDINGS Brain: Restricted diffusion with ADC correlate in the left pons (series 5, images 73-76), which measures up to 1.5 x 1.2 x 0.9 cm. This area is associated with increased T2 hyperintense signal, likely cytotoxic edema. No acute hemorrhage, mass, mass effect, or midline shift. No hydrocephalus or extra-axial collection. Normal pituitary and craniocervical junction. Hemosiderin deposition in the right thalamus and cerebral peduncle, likely sequela of prior hypertensive microhemorrhage. Vascular: Please see MRA findings below. Skull and upper cervical spine: Normal marrow signal. Sinuses/Orbits: Mucosal thickening in the ethmoid air cells. No acute finding in the orbits. Other: The mastoid air cells are well aerated. MRA HEAD FINDINGS Anterior circulation: Both internal carotid arteries are patent to the termini, without significant stenosis. A1 segments patent. Normal  anterior communicating artery. Anterior cerebral arteries are patent to their distal aspects without significant stenosis. 1-2 mm laterally directed outpouching from the proximal right A2 (series 9, image 111), which may represent a tiny aneurysm versus an infundibulum. No M1 stenosis or occlusion. Distal MCA branches perfused to their distal aspects without significant stenosis. Posterior circulation: Right dominant system, with relatively large right vertebral and basilar arteries, which are otherwise unremarkable; no evidence of aneurysmal dilatation or significant tortuosity. Vertebral arteries patent to the vertebrobasilar junction without stenosis. Basilar patent to its distal aspect. Superior cerebellar arteries patent proximally. Patent P1 segments. PCAs perfused to their distal aspects without significant stenosis. The right posterior communicating artery is patent. Anatomic variants: None significant IMPRESSION: 1.  Acute infarct in the left pons. 2. No intracranial large vessel occlusion or significant stenosis. 3. 1-2 mm laterally directed outpouching from the proximal right A2, which may represent a tiny aneurysm versus an infundibulum. These results were called by telephone at the time of interpretation on 12/19/2022 at 8:25 pm to provider [facility], who verbally acknowledged these results. Electronically Signed   By: Wiliam Ke M.D.   On: 12/19/2022 20:25   CT Head Wo Contrast  Result Date: 12/19/2022 CLINICAL DATA:  Hyperglycemia and weakness. EXAM: CT HEAD WITHOUT CONTRAST TECHNIQUE: Contiguous axial images were obtained from the base of the skull through the vertex without intravenous contrast. RADIATION DOSE REDUCTION: This exam was performed according to the departmental dose-optimization program which includes automated exposure control, adjustment of the mA and/or kV according to patient size and/or use of iterative reconstruction technique. COMPARISON:  None Available. FINDINGS: Brain: No evidence of acute infarction, hemorrhage, hydrocephalus, extra-axial collection or mass lesion/mass effect. Vascular: The basilar artery measures approximately 6 mm in diameter and is mildly calcified and tortuous in appearance. Skull: Normal. Negative for fracture or focal lesion. Sinuses/Orbits: No acute finding. Other: None. IMPRESSION: 1. No acute intracranial abnormality. 2. 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 Electronically Signed   By: Aram Candela M.D.   On: 12/19/2022 15:15      LOS: 1 day    Joycelyn Das, MD [facility] Available via Epic secure chat 7am-7pm After these hours, please refer to coverage provider listed on [email or web address] 12/21/2022, 10:18 AM

## 2022-12-21 NOTE — Progress Notes (Signed)
Inpatient Rehab Coordinator Note:  I met with patient and her step mother at bedside to discuss CIR recommendations and goals/expectations of CIR stay.  We reviewed 3 hrs/day of therapy, physician follow up, and average length of stay 2 weeks (dependent upon progress) with goals of supervision.  We discussed likely need for 24/7 supervision initially at discharge and step mom confirms that she and pt's father, Kevin Fenton, can provide this level of care.  We discussed home access, pt lives in a second floor apartment with 20 steps to enter but also has the option to d/c to her parents home which is one level with fewer steps to enter.  We discussed need for insurance auth and I will start that request today.  Will follow.   Estill Dooms, PT, DPT Admissions Coordinator [phone] 12/21/22  1:27 PM

## 2022-12-22 DIAGNOSIS — E119 Type 2 diabetes mellitus without complications: Secondary | ICD-10-CM | POA: Diagnosis not present

## 2022-12-22 DIAGNOSIS — R531 Weakness: Secondary | ICD-10-CM | POA: Diagnosis not present

## 2022-12-22 DIAGNOSIS — I10 Essential (primary) hypertension: Secondary | ICD-10-CM | POA: Diagnosis not present

## 2022-12-22 DIAGNOSIS — I639 Cerebral infarction, unspecified: Secondary | ICD-10-CM | POA: Diagnosis not present

## 2022-12-22 LAB — GLUCOSE, CAPILLARY
Glucose-Capillary: 251 mg/dL — ABNORMAL HIGH (ref 70–99)
Glucose-Capillary: 220 mg/dL — ABNORMAL HIGH (ref 70–99)
Glucose-Capillary: 190 mg/dL — ABNORMAL HIGH (ref 70–99)
Glucose-Capillary: 222 mg/dL — ABNORMAL HIGH (ref 70–99)

## 2022-12-22 LAB — BETA-2-GLYCOPROTEIN I ABS, IGG/M/A
Beta-2 Glyco I IgG: <9 GPI IgG units (ref 0–20)
Beta-2-Glycoprotein I IgA: <9 GPI IgA units (ref 0–25)
Beta-2-Glycoprotein I IgM: <9 GPI IgM units (ref 0–32)

## 2022-12-22 LAB — PROTEIN C ACTIVITY: Protein C Activity: 145 % (ref 73–180)

## 2022-12-22 LAB — PROTEIN S, TOTAL: Protein S Ag, Total: 110 % (ref 60–150)

## 2022-12-22 LAB — LUPUS ANTICOAGULANT PANEL
DRVVT: 35.5 s (ref 0.0–47.0)
PTT Lupus Anticoagulant: 29.5 s (ref 0.0–43.5)

## 2022-12-22 LAB — MAGNESIUM: Magnesium: 1.6 mg/dL — ABNORMAL LOW (ref 1.7–2.4)

## 2022-12-22 LAB — BASIC METABOLIC PANEL
Anion gap: 10 (ref 5–15)
BUN: 10 mg/dL (ref 6–20)
CO2: 20 mmol/L — ABNORMAL LOW (ref 22–32)
Calcium: 8.3 mg/dL — ABNORMAL LOW (ref 8.9–10.3)
Chloride: 103 mmol/L (ref 98–111)
Creatinine, Ser: 0.98 mg/dL (ref 0.44–1.00)
GFR, Estimated: >60 mL/min (ref >60)
Glucose, Bld: 254 mg/dL — ABNORMAL HIGH (ref 70–99)
Potassium: 3.9 mmol/L (ref 3.5–5.1)
Sodium: 133 mmol/L — ABNORMAL LOW (ref 135–145)

## 2022-12-22 LAB — PROTEIN S ACTIVITY: Protein S Activity: 104 % (ref 63–140)

## 2022-12-22 LAB — CBC
HCT: 37.2 % (ref 36.0–46.0)
Hemoglobin: 11.7 g/dL — ABNORMAL LOW (ref 12.0–15.0)
MCH: 28.5 pg (ref 26.0–34.0)
MCHC: 31.5 g/dL (ref 30.0–36.0)
MCV: 90.5 fL (ref 80.0–100.0)
Platelets: 265 10*3/uL (ref 150–400)
RBC: 4.11 MIL/uL (ref 3.87–5.11)
RDW: 13.5 % (ref 11.5–15.5)
WBC: 12.1 10*3/uL — ABNORMAL HIGH (ref 4.0–10.5)
nRBC: 0 % (ref 0.0–0.2)

## 2022-12-22 LAB — CARDIOLIPIN ANTIBODIES, IGG, IGM, IGA
Anticardiolipin IgA: <9 U/mL (ref 0–11)
Anticardiolipin IgG: <9 GPL U/mL (ref 0–14)
Anticardiolipin IgM: <9 [MPL'U]/mL (ref 0–12)

## 2022-12-22 LAB — HOMOCYSTEINE: Homocysteine: 9 umol/L (ref 0.0–14.5)

## 2022-12-22 MED ORDER — MAGNESIUM OXIDE -MG SUPPLEMENT 400 (240 MG) MG PO TABS
400.0000 mg | ORAL_TABLET | Freq: Two times a day (BID) | ORAL | Status: DC
  Administered 2022-12-22 – 2022-12-24 (×5): 400 mg via ORAL
  Filled 2022-12-22 (×5): qty 1

## 2022-12-22 MED ORDER — POTASSIUM CHLORIDE CRYS ER 20 MEQ PO TBCR
40.0000 meq | EXTENDED_RELEASE_TABLET | Freq: Once | ORAL | Status: AC
  Administered 2022-12-22: 40 meq via ORAL
  Filled 2022-12-22: qty 2

## 2022-12-22 NOTE — Plan of Care (Signed)

## 2022-12-22 NOTE — Progress Notes (Signed)
PROGRESS NOTE    Lisa Werner  ZOX:[id number] DOB: 10/30/1980 DOA: 12/19/2022 PCP: Julieanne Manson, MD    Brief Narrative:  42 year old female with past medical history of hypertension, poorly controlled diabetes, hyperlipidemia presented to hospital with 4-day history of right-sided weakness and hyperglycemia.  MRI was done which showed left pontine stroke.  Patient was then [facility] for further evaluation and treatment.  Assessment & Plan:   Subacute left pontine stroke Neurology on board.  Continue aspirin Plavix and statins.  2D echocardiogram with bubble study shows normal echocardiogram without any interatrial shunt.  Hemoglobin A1c pending.  Hypercoagulable workup pending. MRI with acute pontine infarct, MRI without large vessel stenosis, PT OT recommend CIR.   DM2, poorly controlled Pending hemoglobin A1c.  Continue glargine 20 units.  Will need compliance with insulin on discharge.  Hypomagnesemia.  Will replace.  Check levels in AM.   HTN Continue on HCTZ and lisinopril    Dyslipidemia Continue Crestor.   Leukocytosis Chronic for 5 years.  WBC at 12.1 from 11.6.  Temperature max of 99 F.    DVT prophylaxis: SCDs Start: 12/19/22 1952   Code Status:     Code Status: Full Code  Disposition: CIR as per PT evaluation.  Medically stable for disposition.  Status is: Inpatient Remains inpatient appropriate because: Need for inpatient rehabilitation   Family Communication: None at bedside  Consultants:  Neurology  Procedures:  None  Antimicrobials:  None  Anti-infectives (From admission, onward)    None       Subjective: Today, patient was seen and examined at bedside.  Mildly sleepy.  Denies any new weakness shortness of breath chest pain.  Objective: Vitals:   12/21/22 2315 12/22/22 0425 12/22/22 0500 12/22/22 0726  BP: (!) 142/87 (!) 154/91  (!) 140/90  Pulse: 84 78  80  Resp: 18 18  18   Temp: 98.1 F (36.7 C) 98.1 F  (36.7 C)  98 F (36.7 C)  TempSrc: Oral Oral  Oral  SpO2: 95% 99%  96%  Weight:   98.2 kg   Height:        Intake/Output Summary (Last 24 hours) at 12/22/2022 0910 Last data filed at 12/21/2022 1200 Gross per 24 hour  Intake 320 ml  Output --  Net 320 ml   Filed Weights   12/19/22 1122 12/21/22 0500 12/22/22 0500  Weight: 90.7 kg 99.5 kg 98.2 kg    Physical Examination: Body mass index is 39.6 kg/m.   General: Obese built, not in obvious distress HENT:   No scleral pallor or icterus noted. Oral mucosa is moist.  Chest:  Clear breath sounds.  No crackles or wheezes.  CVS: S1 &S2 heard. No murmur.  Regular rate and rhythm. Abdomen: Soft, nontender, nondistended.  Bowel sounds are heard.   Extremities: No cyanosis, clubbing or edema.  Peripheral pulses are palpable. Psych: Alert, awake and medicated, flat affect, CNS:  No cranial nerve deficits.  Right-sided weakness noted. Skin: Warm and dry.  No rashes noted.  Data Reviewed:   CBC: Recent Labs  Lab 12/19/22 1136 12/19/22 1258 12/20/22 0133 12/22/22 0540  WBC 13.4*  --  11.6* 12.1*  NEUTROABS 11.0*  --  7.6  --   HGB 13.0 13.6 13.2 11.7*  HCT 40.3 40.0 41.8 37.2  MCV 90.8  --  89.5 90.5  PLT 292  --  295 265    Basic Metabolic Panel: Recent Labs  Lab 12/19/22 1136 12/19/22 1258 12/20/22 0133 12/22/22 0540  NA  131* 130* 133* 133*  K 4.4 4.3 3.5 3.9  CL 95*  --  101 103  CO2 20*  --  21* 20*  GLUCOSE 402*  --  279* 254*  BUN 9  --  7 10  CREATININE 1.00  --  0.80 0.98  CALCIUM 9.2  --  8.8* 8.3*  MG 1.7  --  1.9 1.6*    Liver Function Tests: Recent Labs  Lab 12/19/22 1136 12/20/22 0133  AST 22 24  ALT 23 21  ALKPHOS 94 87  BILITOT 0.8 0.7  PROT 7.1 7.1  ALBUMIN 3.0* 2.8*     Radiology Studies: ECHOCARDIOGRAM COMPLETE BUBBLE STUDY  Result Date: 12/20/2022    ECHOCARDIOGRAM REPORT   Patient Name:   Lisa Werner Soy Date of Exam: 12/20/2022 Medical Rec #:  [id number]                Height:       62.0 in Accession #:    [id number]              Weight:       200.0 lb Date of Birth:  1980/09/29              BSA:          1.912 m Patient Age:    41 years                BP:           110/76 mmHg Patient Gender: F                       HR:           88 bpm. Exam Location:  Inpatient Procedure: 2D Echo, Cardiac Doppler, Color Doppler and Strain Analysis Indications:    Stroke  History:        Patient has no prior history of Echocardiogram examinations.                 Risk Factors:Hypertension, Diabetes and Dyslipidemia.  Sonographer:    Sheralyn Boatman RDCS Referring Phys: [id number] Angie Fava  Sonographer Comments: Patient is obese. Image acquisition challenging due to patient body habitus. IMPRESSIONS  1. Left ventricular ejection fraction, by estimation, is 65 to 70%. The left ventricle has normal function. The left ventricle has no regional wall motion abnormalities. There is moderate left ventricular hypertrophy. Left ventricular diastolic parameters were normal. The average left ventricular global longitudinal strain is -22.0 %. The global longitudinal strain is normal.  2. Right ventricular systolic function is normal. The right ventricular size is normal.  3. The mitral valve is normal in structure. Trivial mitral valve regurgitation. No evidence of mitral stenosis.  4. The aortic valve is tricuspid. Aortic valve regurgitation is not visualized. No aortic stenosis is present.  5. Agitated saline contrast bubble study was negative, with no evidence of any interatrial shunt. Comparison(s): No prior Echocardiogram. Conclusion(s)/Recommendation(s): Otherwise normal echocardiogram, with minor abnormalities described in the report. FINDINGS  Left Ventricle: Left ventricular ejection fraction, by estimation, is 65 to 70%. The left ventricle has normal function. The left ventricle has no regional wall motion abnormalities. The average left ventricular global longitudinal strain is -22.0 %. The  global longitudinal strain is normal. The left ventricular internal cavity size was small. There is moderate left ventricular hypertrophy. Left ventricular diastolic parameters were normal. Right Ventricle: The right ventricular size is normal. No increase in right ventricular wall thickness. Right  ventricular systolic function is normal. Left Atrium: Left atrial size was normal in size. Right Atrium: Right atrial size was normal in size. Pericardium: There is no evidence of pericardial effusion. Mitral Valve: The mitral valve is normal in structure. Trivial mitral valve regurgitation. No evidence of mitral valve stenosis. Tricuspid Valve: The tricuspid valve is normal in structure. Tricuspid valve regurgitation is trivial. No evidence of tricuspid stenosis. Aortic Valve: The aortic valve is tricuspid. Aortic valve regurgitation is not visualized. No aortic stenosis is present. Pulmonic Valve: The pulmonic valve was grossly normal. Pulmonic valve regurgitation is not visualized. No evidence of pulmonic stenosis. Aorta: The aortic root, ascending aorta, aortic arch and descending aorta are all structurally normal, with no evidence of dilitation or obstruction. Venous: The inferior vena cava was not well visualized. IAS/Shunts: No atrial level shunt detected by color flow Doppler. Agitated saline contrast was given intravenously to evaluate for intracardiac shunting. Agitated saline contrast bubble study was negative, with no evidence of any interatrial shunt.  LEFT VENTRICLE PLAX 2D LVIDd:         3.35 cm     Diastology LVIDs:         2.10 cm     LV e' medial:    5.55 cm/s LV PW:         1.55 cm     LV E/e' medial:  12.4 LV IVS:        1.55 cm     LV e' lateral:   5.87 cm/s LVOT diam:     2.30 cm     LV E/e' lateral: 11.7 LV SV:         68 LV SV Index:   35          2D Longitudinal Strain LVOT Area:     4.15 cm    2D Strain GLS (A2C):   -24.5 %                            2D Strain GLS (A3C):   -17.6 %                             2D Strain GLS (A4C):   -23.9 % LV Volumes (MOD)           2D Strain GLS Avg:     -22.0 % LV vol d, MOD A2C: 58.1 ml LV vol d, MOD A4C: 60.2 ml LV vol s, MOD A2C: 16.8 ml LV vol s, MOD A4C: 16.7 ml LV SV MOD A2C:     41.3 ml LV SV MOD A4C:     60.2 ml LV SV MOD BP:      42.3 ml RIGHT VENTRICLE            IVC RV S prime:     8.92 cm/s  IVC diam: 2.00 cm TAPSE (M-mode): 1.5 cm LEFT ATRIUM             Index        RIGHT ATRIUM           Index LA diam:        3.50 cm 1.83 cm/m   RA Area:     11.50 cm LA Vol (A2C):   28.3 ml 14.80 ml/m  RA Volume:   22.80 ml  11.93 ml/m LA Vol (A4C):   32.2 ml 16.84 ml/m LA Biplane Vol: 30.3 ml 15.85  ml/m  AORTIC VALVE LVOT Vmax:   88.10 cm/s LVOT Vmean:  60.200 cm/s LVOT VTI:    0.163 m  AORTA Ao Root diam: 3.00 cm Ao Asc diam:  2.80 cm MITRAL VALVE MV Area (PHT): 4.54 cm    SHUNTS MV Decel Time: 167 msec    Systemic VTI:  0.16 m MV E velocity: 68.55 cm/s  Systemic Diam: 2.30 cm MV A velocity: 61.70 cm/s MV E/A ratio:  1.11 Jodelle Red MD Electronically signed by Jodelle Red MD Signature Date/Time: 12/20/2022/12:03:25 PM    Final       LOS: 2 days    Joycelyn Das, MD [facility] Available via Epic secure chat 7am-7pm After these hours, please refer to coverage provider listed on [email or web address] 12/22/2022, 9:10 AM

## 2022-12-22 NOTE — Progress Notes (Signed)
Occupational Therapy Treatment Patient Details Name: Lisa Werner MRN: [id number] DOB: 1980-10-31 Today's Date: 12/22/2022   History of present illness 42 y.o. female presented from home to [facility] ED complaining of right-sided weakness. 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 PHMx: diabetes mellitus 2, hypertension, hyperlipidemia.   OT comments  Pt continuing to progress in OT sessions, pt ambulating in hall with CGA to Min A, needs slight assist with RW management and to steady balance due to occasional R knee buckling during ambulation. Pt still needs Mod A with LBD due to difficult with fully controlling RLE in a fixed position and decreased dexterity of RUE, educated pt on one handed strategy to don socks. OT encourage pt use RUE more in functional tasks to promote neuromuscular feedback. Although pt is progressing she is still far from baseline, OT to continue to progress pt as able. DC plans remain appropriate for AIR.        If plan is discharge home, recommend the following:  A little help with walking and/or transfers;A lot of help with bathing/dressing/bathroom;Assistance with cooking/housework;Help with stairs or ramp for entrance;Assist for transportation   Equipment Recommendations  Other (comment) (TBD at next level of care)    Recommendations for Other Services      Precautions / Restrictions Precautions Precautions: Fall Precaution Comments: R hemi, inattention Restrictions Weight Bearing Restrictions: No       Mobility Bed Mobility Overal bed mobility: Needs Assistance Bed Mobility: Supine to Sit, Sit to Supine, Rolling Rolling: Supervision, Used rails   Supine to sit: Supervision, Used rails, HOB elevated Sit to supine: Supervision, Used rails, HOB elevated   General bed mobility comments: cues needed for safety, pt getting dangerously close to EOB during ADLs    Transfers Overall transfer level: Needs assistance Equipment used:  Rolling walker (2 wheels) Transfers: Sit to/from Stand Sit to Stand: Contact guard assist           General transfer comment: STSx2 CGA     Balance Overall balance assessment: Needs assistance Sitting-balance support: No upper extremity supported, Feet supported Sitting balance-Leahy Scale: Fair     Standing balance support: No upper extremity supported, During functional activity, Bilateral upper extremity supported, Reliant on assistive device for balance   Standing balance comment: reliant on external support.                           ADL either performed or assessed with clinical judgement   ADL Overall ADL's : Needs assistance/impaired                     Lower Body Dressing: Moderate assistance;Sitting/lateral leans Lower Body Dressing Details (indicate cue type and reason): don/doff socks, OT assisting with helping pt maintain BLEs in figure four position, min A to help pull up briefs Toilet Transfer: Rolling walker (2 wheels);Ambulation;Contact guard assist           Functional mobility during ADLs: Contact guard assist;Minimal assistance;Rolling walker (2 wheels) General ADL Comments: Pt ambulating in hall with CGA to min A due to unsteadiness with RW, has slight  trouble managing RW b    Extremity/Trunk Assessment              Vision   Additional Comments: Pt reports blurry vision when reading, not necessarily double vision per pt report   Perception     Praxis      Cognition Arousal: Alert Behavior During  Therapy: Impulsive Overall Cognitive Status: Impaired/Different from baseline Area of Impairment: Safety/judgement, Problem solving                         Safety/Judgement: Decreased awareness of safety, Decreased awareness of deficits   Problem Solving: Slow processing, Difficulty sequencing, Requires verbal cues, Requires tactile cues          Exercises General Exercises - Lower Extremity Ankle  Circles/Pumps: AROM, Right, Seated, 10 reps Hip Flexion/Marching: AROM, Right, Seated, 10 reps Other Exercises Other Exercises: PNF D1/D2 flexion and ext to RUE to promote nueromuscular feedback    Shoulder Instructions       General Comments Pt not able to hold urine but aware of need to go to bathroom. 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 OT Goal Formulation: With patient Time For Goal Achievement: 01/03/23 Potential to Achieve Goals: Good  Plan      Co-evaluation                 AM-PAC OT "6 Clicks" Daily Activity     Outcome Measure   Help from another person eating meals?: A Little Help from another person taking care of personal grooming?: A Little Help from another person toileting, which includes using toliet, bedpan, or urinal?: A Lot Help from another person bathing (including washing, rinsing, drying)?: A Lot Help from another person to put on and taking off regular upper body clothing?: A Little Help from another person to put on and taking off regular lower body clothing?: A Lot 6 Click Score: 15    End of Session Equipment Utilized During Treatment: Gait belt;Rolling walker (2 wheels)  OT Visit Diagnosis: Unsteadiness on feet (R26.81);Other abnormalities of gait and mobility (R26.89);Muscle weakness (generalized) (M62.81);History of falling (Z91.81);Hemiplegia and hemiparesis Hemiplegia - Right/Left: Right Hemiplegia - dominant/non-dominant:  Dominant Hemiplegia - caused by: Cerebral infarction   Activity Tolerance Patient tolerated treatment well   Patient Left in bed;with call bell/phone within reach;with bed alarm set   Nurse Communication Mobility status        Time: 5784-6962 OT Time Calculation (min): 32 min  Charges: OT General Charges $OT Visit: 1 Visit OT Treatments $Self Care/Home Management : 8-22 mins $Therapeutic Activity: 8-22 mins  12/22/2022  AB, OTR/L  Acute Rehabilitation Services  Office: [phone]   Tristan Schroeder 12/22/2022, 5:48 PM

## 2022-12-23 DIAGNOSIS — E119 Type 2 diabetes mellitus without complications: Secondary | ICD-10-CM | POA: Diagnosis not present

## 2022-12-23 DIAGNOSIS — I639 Cerebral infarction, unspecified: Secondary | ICD-10-CM | POA: Diagnosis not present

## 2022-12-23 DIAGNOSIS — I10 Essential (primary) hypertension: Secondary | ICD-10-CM | POA: Diagnosis not present

## 2022-12-23 DIAGNOSIS — R531 Weakness: Secondary | ICD-10-CM | POA: Diagnosis not present

## 2022-12-23 LAB — PROTEIN C, TOTAL: Protein C, Total: 98 % (ref 60–150)

## 2022-12-23 LAB — CBC
HCT: 37.7 % (ref 36.0–46.0)
Hemoglobin: 12.1 g/dL (ref 12.0–15.0)
MCH: 28.3 pg (ref 26.0–34.0)
MCHC: 32.1 g/dL (ref 30.0–36.0)
MCV: 88.3 fL (ref 80.0–100.0)
Platelets: 307 10*3/uL (ref 150–400)
RBC: 4.27 MIL/uL (ref 3.87–5.11)
RDW: 13.6 % (ref 11.5–15.5)
WBC: 11.9 10*3/uL — ABNORMAL HIGH (ref 4.0–10.5)
nRBC: 0 % (ref 0.0–0.2)

## 2022-12-23 LAB — GLUCOSE, CAPILLARY
Glucose-Capillary: 191 mg/dL — ABNORMAL HIGH (ref 70–99)
Glucose-Capillary: 175 mg/dL — ABNORMAL HIGH (ref 70–99)
Glucose-Capillary: 182 mg/dL — ABNORMAL HIGH (ref 70–99)
Glucose-Capillary: 224 mg/dL — ABNORMAL HIGH (ref 70–99)

## 2022-12-23 LAB — BASIC METABOLIC PANEL
Anion gap: 7 (ref 5–15)
BUN: 9 mg/dL (ref 6–20)
CO2: 22 mmol/L (ref 22–32)
Calcium: 9 mg/dL (ref 8.9–10.3)
Chloride: 105 mmol/L (ref 98–111)
Creatinine, Ser: 0.84 mg/dL (ref 0.44–1.00)
GFR, Estimated: >60 mL/min (ref >60)
Glucose, Bld: 171 mg/dL — ABNORMAL HIGH (ref 70–99)
Potassium: 3.9 mmol/L (ref 3.5–5.1)
Sodium: 134 mmol/L — ABNORMAL LOW (ref 135–145)

## 2022-12-23 LAB — HEMOGLOBIN A1C
Hgb A1c MFr Bld: >15.5 % — ABNORMAL HIGH (ref 4.8–5.6)
Hgb A1c MFr Bld: >15.5 % — ABNORMAL HIGH (ref 4.8–5.6)
Mean Plasma Glucose: >398 mg/dL
Mean Plasma Glucose: >398 mg/dL

## 2022-12-23 LAB — MAGNESIUM: Magnesium: 1.8 mg/dL (ref 1.7–2.4)

## 2022-12-23 NOTE — Plan of Care (Signed)

## 2022-12-23 NOTE — Progress Notes (Signed)
Occupational Therapy Treatment Patient Details Name: Lisa Werner MRN: [id number] DOB: [birth year] Today's Date: 12/23/2022   History of present illness 42 y.o. female presented from home to [facility] ED complaining of right-sided weakness. 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 PHMx: diabetes mellitus 2, hypertension, hyperlipidemia.   OT comments  Pt progressing towards goals this session, needing mod A for LB ADL, CGA for transfers with RW. Pt with RUE weakness, worked on seated and standing functional reach tasks at EOB and standing at sink, along with WB on RUE. Pt provided with pink foam cube and red theraband, able to demo exercises. Pt presenting with impairments listed below, will follow acutely. Patient will benefit from intensive inpatient follow up therapy, >3 hours/day to maximize safety/ind with ADLs/functional mobility.       If plan is discharge home, recommend the following:  A little help with walking and/or transfers;A lot of help with bathing/dressing/bathroom;Assistance with cooking/housework;Help with stairs or ramp for entrance;Assist for transportation   Equipment Recommendations  Other (comment) (defer)    Recommendations for Other Services      Precautions / Restrictions Precautions Precautions: Fall Precaution Comments: R hemi, inattention Restrictions Weight Bearing Restrictions: No       Mobility Bed Mobility Overal bed mobility: Needs Assistance Bed Mobility: Supine to Sit, Sit to Supine, Rolling     Supine to sit: Supervision, Used rails, HOB elevated Sit to supine: Supervision, Used rails, HOB elevated        Transfers Overall transfer level: Needs assistance Equipment used: Rolling walker (2 wheels) Transfers: Sit to/from Stand Sit to Stand: Contact guard assist                 Balance Overall balance assessment: Needs assistance Sitting-balance support: No upper extremity supported, Feet supported Sitting  balance-Leahy Scale: Good Sitting balance - Comments: reaches outside BOS without LOB   Standing balance support: No upper extremity supported, During functional activity, Bilateral upper extremity supported, Reliant on assistive device for balance Standing balance-Leahy Scale: Fair Standing balance comment: unilateral UE support in standing                           ADL either performed or assessed with clinical judgement   ADL Overall ADL's : Needs assistance/impaired                     Lower Body Dressing: Moderate assistance Lower Body Dressing Details (indicate cue type and reason): donning socks Toilet Transfer: Contact guard assist;Ambulation;Regular Toilet;Rolling walker (2 wheels) Toilet Transfer Details (indicate cue type and reason): simulated via functional mobility         Functional mobility during ADLs: Contact guard assist;Rolling walker (2 wheels)      Extremity/Trunk Assessment Upper Extremity Assessment RUE Deficits / Details: overall ataxic, distal vs proximal weakness, impaired FMC RUE Coordination: decreased fine motor;decreased gross motor   Lower Extremity Assessment Lower Extremity Assessment: Defer to PT evaluation        Vision       Perception Perception Perception: Not tested   Praxis Praxis Praxis: Not tested    Cognition Arousal: Alert Behavior During Therapy: Flat affect Overall Cognitive Status: Impaired/Different from baseline Area of Impairment: Safety/judgement, Problem solving                     Memory: Decreased short-term memory Following Commands: Follows one step commands consistently Safety/Judgement: Decreased awareness of safety,  Decreased awareness of deficits Awareness: Emergent Problem Solving: Slow processing, Difficulty sequencing, Requires verbal cues, Requires tactile cues General Comments: on phone explaining she has to "go to rehab for her R side"        Exercises Exercises:  Other exercises Other Exercises Other Exercises: pink squeeze cube x5 RUE Other Exercises: red theraband shoulder flex RUE x5 Other Exercises: seated functional reach RUE x10 Other Exercises: standing WBing on RUE Other Exercises: standing functional reach x5    Shoulder Instructions       General Comments      Pertinent Vitals/ Pain       Pain Assessment Pain Assessment: No/denies pain  Home Living                                          Prior Functioning/Environment              Frequency  Min 1X/week        Progress Toward Goals  OT Goals(current goals can now be found in the care plan section)  Progress towards OT goals: Progressing toward goals  Acute Rehab OT Goals Patient Stated Goal: none stated OT Goal Formulation: With patient Time For Goal Achievement: 01/03/23 Potential to Achieve Goals: Good ADL Goals Pt Will Perform Grooming: with modified independence;standing Pt Will Perform Upper Body Bathing: with modified independence;sitting Pt Will Perform Lower Body Bathing: with modified independence;sit to/from stand Pt Will Perform Upper Body Dressing: with modified independence;sitting Pt Will Perform Lower Body Dressing: with modified independence;sit to/from stand Pt Will Transfer to Toilet: with modified independence;ambulating;regular height toilet;grab bars Pt Will Perform Toileting - Clothing Manipulation and hygiene: with modified independence;sit to/from stand Pt/caregiver will Perform Home Exercise Program: Increased ROM;Increased strength;Right Upper extremity;With written HEP provided;Independently Additional ADL Goal #1: Pt will be independent in and out of the bed for basic ADLs  Plan      Co-evaluation                 AM-PAC OT "6 Clicks" Daily Activity     Outcome Measure   Help from another person eating meals?: A Little Help from another person taking care of personal grooming?: A Little Help from  another person toileting, which includes using toliet, bedpan, or urinal?: A Lot Help from another person bathing (including washing, rinsing, drying)?: A Lot Help from another person to put on and taking off regular upper body clothing?: A Little Help from another person to put on and taking off regular lower body clothing?: A Lot 6 Click Score: 15    End of Session Equipment Utilized During Treatment: Gait belt;Rolling walker (2 wheels)  OT Visit Diagnosis: Unsteadiness on feet (R26.81);Other abnormalities of gait and mobility (R26.89);Muscle weakness (generalized) (M62.81);History of falling (Z91.81);Hemiplegia and hemiparesis Hemiplegia - Right/Left: Right Hemiplegia - dominant/non-dominant: Dominant Hemiplegia - caused by: Cerebral infarction   Activity Tolerance Patient tolerated treatment well   Patient Left in bed;with call bell/phone within reach;with bed alarm set   Nurse Communication Mobility status        Time: 2440-1027 OT Time Calculation (min): 22 min  Charges: OT General Charges $OT Visit: 1 Visit OT Treatments $Therapeutic Activity: 8-22 mins  Lisa Fila, OTD, OTR/L SecureChat Preferred Acute Rehab [phone]   Lisa Werner 12/23/2022, 12:35 PM

## 2022-12-23 NOTE — Progress Notes (Signed)
PROGRESS NOTE    Lisa Werner  ZOX:[id number] DOB: 20-Aug-1980 DOA: 12/19/2022 PCP: Julieanne Manson, MD    Brief Narrative:  42 year old female with past medical history of hypertension, poorly controlled diabetes, hyperlipidemia presented to hospital with 4-day history of right-sided weakness and hyperglycemia.  MRI was done which showed left pontine stroke.  Patient was then admitted to the hospital for further evaluation and treatment.  Assessment & Plan:   Subacute left pontine stroke Neurology followed the patient during hospitalization.  Continue aspirin Plavix and statins.  2D echocardiogram with bubble study shows normal echocardiogram without any interatrial shunt.  Hemoglobin A1c of more than 15.5.  Hypercoagulable workup with Antithrombin III within normal range, protein C, S activity within range, protein S within normal range, lupus anticoagulant and beta-2 glycoprotein within normal range.  Cardiolipin antibodies within normal range.  Homocystine within normal range.  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  MRI with acute pontine infarct, MRI without large vessel stenosis, PT OT has recommend CIR.   DM2, poorly controlled Hemoglobin A1c more than 15.5.  Continue glargine 20 units.  Continue sliding scale insulin Accu-Cheks diabetic diet.  Will need compliance with insulin on discharge.  Latest POC glucose of 191.  Hypomagnesemia.  Improved after replacement.  Latest magnesium 1.8.   HTN on HCTZ and lisinopril at home.  Consider discontinuation of HCTZ on discharge.  Currently off antihypertensive.  Latest blood pressure 128/83.   Dyslipidemia Continue Crestor.   Leukocytosis Chronic for 5 years.  WBC at 11.9 from 12.1.  Afebrile.  No signs of infection.    DVT prophylaxis: SCDs Start: 12/19/22 1952   Code Status:     Code Status: Full Code  Disposition: CIR as per PT evaluation.  Medically stable for disposition.  Status is:  Inpatient  Remains inpatient appropriate because: Need for inpatient rehabilitation   Family Communication: None at bedside  Consultants:  Neurology  Procedures:  None  Antimicrobials:  None  Anti-infectives (From admission, onward)    None       Subjective: Today, patient was seen and examined at bedside.  Denies interval complaints.  Denies any dizziness lightheadedness headache or increasing weakness.  Objective: Vitals:   12/23/22 0003 12/23/22 0409 12/23/22 0500 12/23/22 0721  BP: (!) 140/82 126/82  128/83  Pulse: 80 85  82  Resp: 18 17  20   Temp: 98.1 F (36.7 C) 98.4 F (36.9 C)  98.4 F (36.9 C)  TempSrc: Oral Oral  Oral  SpO2: 98% 97%  97%  Weight:   97.7 kg   Height:        Intake/Output Summary (Last 24 hours) at 12/23/2022 1043 Last data filed at 12/23/2022 8119 Gross per 24 hour  Intake 277 ml  Output --  Net 277 ml   Filed Weights   12/21/22 0500 12/22/22 0500 12/23/22 0500  Weight: 99.5 kg 98.2 kg 97.7 kg    Physical Examination: Body mass index is 39.4 kg/m.   General: Obese built, not in obvious distress, alert awake and Communicative, HENT:   No scleral pallor or icterus noted. Oral mucosa is moist.  Chest:  Clear breath sounds.  No crackles or wheezes.  CVS: S1 &S2 heard. No murmur.  Regular rate and rhythm. Abdomen: Soft, nontender, nondistended.  Bowel sounds are heard.   Extremities: No cyanosis, clubbing or edema.  Peripheral pulses are palpable. Psych: Alert, awake oriented, flat affect, CNS:  No cranial nerve deficits.  Right-sided weakness noted. Skin: Warm  and dry.  No rashes noted.  Data Reviewed:   CBC: Recent Labs  Lab 12/19/22 1136 12/19/22 1258 12/20/22 0133 12/22/22 0540 12/23/22 0513  WBC 13.4*  --  11.6* 12.1* 11.9*  NEUTROABS 11.0*  --  7.6  --   --   HGB 13.0 13.6 13.2 11.7* 12.1  HCT 40.3 40.0 41.8 37.2 37.7  MCV 90.8  --  89.5 90.5 88.3  PLT 292  --  295 265 307    Basic Metabolic Panel: Recent  Labs  Lab 12/19/22 1136 12/19/22 1258 12/20/22 0133 12/22/22 0540 12/23/22 0513  NA 131* 130* 133* 133* 134*  K 4.4 4.3 3.5 3.9 3.9  CL 95*  --  101 103 105  CO2 20*  --  21* 20* 22  GLUCOSE 402*  --  279* 254* 171*  BUN 9  --  7 10 9   CREATININE 1.00  --  0.80 0.98 0.84  CALCIUM 9.2  --  8.8* 8.3* 9.0  MG 1.7  --  1.9 1.6* 1.8    Liver Function Tests: Recent Labs  Lab 12/19/22 1136 12/20/22 0133  AST 22 24  ALT 23 21  ALKPHOS 94 87  BILITOT 0.8 0.7  PROT 7.1 7.1  ALBUMIN 3.0* 2.8*     Radiology Studies: No results found.    LOS: 3 days    Joycelyn Das, MD [facility] Available via Epic secure chat 7am-7pm After these hours, please refer to coverage provider listed on [email or web address] 12/23/2022, 10:43 AM

## 2022-12-24 ENCOUNTER — Inpatient Hospital Stay (HOSPITAL_COMMUNITY)
Admission: RE | Admit: 2022-12-24 | Discharge: 2023-01-09 | DRG: 057 | Disposition: A | Discharge status: Home / Self Care | Payer: BC Managed Care – PPO | Source: Intra-hospital | Attending: Physical Medicine and Rehabilitation | Admitting: Physical Medicine and Rehabilitation

## 2022-12-24 DIAGNOSIS — Z833 Family history of diabetes mellitus: Secondary | ICD-10-CM | POA: Diagnosis not present

## 2022-12-24 DIAGNOSIS — E785 Hyperlipidemia, unspecified: Secondary | ICD-10-CM | POA: Diagnosis present

## 2022-12-24 DIAGNOSIS — Z794 Long term (current) use of insulin: Secondary | ICD-10-CM

## 2022-12-24 DIAGNOSIS — Z803 Family history of malignant neoplasm of breast: Secondary | ICD-10-CM

## 2022-12-24 DIAGNOSIS — Z6839 Body mass index (BMI) 39.0-39.9, adult: Secondary | ICD-10-CM

## 2022-12-24 DIAGNOSIS — I10 Essential (primary) hypertension: Secondary | ICD-10-CM | POA: Diagnosis present

## 2022-12-24 DIAGNOSIS — Z825 Family history of asthma and other chronic lower respiratory diseases: Secondary | ICD-10-CM | POA: Diagnosis not present

## 2022-12-24 DIAGNOSIS — R262 Difficulty in walking, not elsewhere classified: Secondary | ICD-10-CM | POA: Diagnosis present

## 2022-12-24 DIAGNOSIS — I69391 Dysphagia following cerebral infarction: Secondary | ICD-10-CM

## 2022-12-24 DIAGNOSIS — Z9109 Other allergy status, other than to drugs and biological substances: Secondary | ICD-10-CM

## 2022-12-24 DIAGNOSIS — E139 Other specified diabetes mellitus without complications: Secondary | ICD-10-CM | POA: Diagnosis present

## 2022-12-24 DIAGNOSIS — Z841 Family history of disorders of kidney and ureter: Secondary | ICD-10-CM

## 2022-12-24 DIAGNOSIS — I639 Cerebral infarction, unspecified: Secondary | ICD-10-CM | POA: Diagnosis present

## 2022-12-24 DIAGNOSIS — D72828 Other elevated white blood cell count: Secondary | ICD-10-CM | POA: Diagnosis present

## 2022-12-24 DIAGNOSIS — E782 Mixed hyperlipidemia: Secondary | ICD-10-CM | POA: Diagnosis present

## 2022-12-24 DIAGNOSIS — Z79899 Other long term (current) drug therapy: Secondary | ICD-10-CM | POA: Diagnosis not present

## 2022-12-24 DIAGNOSIS — Z8261 Family history of arthritis: Secondary | ICD-10-CM

## 2022-12-24 DIAGNOSIS — R531 Weakness: Secondary | ICD-10-CM

## 2022-12-24 DIAGNOSIS — R739 Hyperglycemia, unspecified: Secondary | ICD-10-CM | POA: Diagnosis not present

## 2022-12-24 DIAGNOSIS — R300 Dysuria: Secondary | ICD-10-CM | POA: Diagnosis present

## 2022-12-24 DIAGNOSIS — I69351 Hemiplegia and hemiparesis following cerebral infarction affecting right dominant side: Principal | ICD-10-CM

## 2022-12-24 DIAGNOSIS — R1312 Dysphagia, oropharyngeal phase: Secondary | ICD-10-CM | POA: Diagnosis present

## 2022-12-24 DIAGNOSIS — E871 Hypo-osmolality and hyponatremia: Secondary | ICD-10-CM | POA: Diagnosis present

## 2022-12-24 DIAGNOSIS — E1165 Type 2 diabetes mellitus with hyperglycemia: Secondary | ICD-10-CM | POA: Diagnosis not present

## 2022-12-24 DIAGNOSIS — E119 Type 2 diabetes mellitus without complications: Secondary | ICD-10-CM | POA: Diagnosis not present

## 2022-12-24 DIAGNOSIS — F32A Depression, unspecified: Secondary | ICD-10-CM | POA: Diagnosis present

## 2022-12-24 DIAGNOSIS — Z889 Allergy status to unspecified drugs, medicaments and biological substances status: Secondary | ICD-10-CM

## 2022-12-24 DIAGNOSIS — K5900 Constipation, unspecified: Secondary | ICD-10-CM | POA: Diagnosis present

## 2022-12-24 DIAGNOSIS — Z7409 Other reduced mobility: Secondary | ICD-10-CM | POA: Diagnosis present

## 2022-12-24 DIAGNOSIS — E1065 Type 1 diabetes mellitus with hyperglycemia: Secondary | ICD-10-CM | POA: Diagnosis present

## 2022-12-24 DIAGNOSIS — F329 Major depressive disorder, single episode, unspecified: Secondary | ICD-10-CM | POA: Diagnosis not present

## 2022-12-24 LAB — GLUCOSE, CAPILLARY
Glucose-Capillary: 221 mg/dL — ABNORMAL HIGH (ref 70–99)
Glucose-Capillary: 195 mg/dL — ABNORMAL HIGH (ref 70–99)
Glucose-Capillary: 213 mg/dL — ABNORMAL HIGH (ref 70–99)
Glucose-Capillary: 206 mg/dL — ABNORMAL HIGH (ref 70–99)

## 2022-12-24 MED ORDER — ROSUVASTATIN CALCIUM 20 MG PO TABS
40.0000 mg | ORAL_TABLET | Freq: Every day | ORAL | Status: DC
  Administered 2022-12-25 – 2023-01-09 (×16): 40 mg via ORAL
  Filled 2022-12-24 (×16): qty 2

## 2022-12-24 MED ORDER — PROCHLORPERAZINE MALEATE 5 MG PO TABS: 5.0000 mg | ORAL_TABLET | Freq: Four times a day (QID) | ORAL | Status: DC | PRN

## 2022-12-24 MED ORDER — PROCHLORPERAZINE EDISYLATE 10 MG/2ML IJ SOLN: 5.0000 mg | Freq: Four times a day (QID) | INTRAMUSCULAR | Status: DC | PRN

## 2022-12-24 MED ORDER — DIPHENHYDRAMINE HCL 25 MG PO CAPS: 25.0000 mg | ORAL_CAPSULE | Freq: Four times a day (QID) | ORAL | Status: DC | PRN

## 2022-12-24 MED ORDER — INSULIN ASPART 100 UNIT/ML IJ SOLN
0.0000 [IU] | Freq: Three times a day (TID) | INTRAMUSCULAR | Status: DC
  Administered 2022-12-24: 5 [IU] via SUBCUTANEOUS
  Administered 2022-12-25: 11 [IU] via SUBCUTANEOUS
  Administered 2022-12-25: 5 [IU] via SUBCUTANEOUS
  Administered 2022-12-25: 8 [IU] via SUBCUTANEOUS
  Administered 2022-12-26: 5 [IU] via SUBCUTANEOUS
  Administered 2022-12-26 (×2): 3 [IU] via SUBCUTANEOUS
  Administered 2022-12-27: 15 [IU] via SUBCUTANEOUS
  Administered 2022-12-27 (×2): 8 [IU] via SUBCUTANEOUS
  Administered 2022-12-28 (×2): 5 [IU] via SUBCUTANEOUS
  Administered 2022-12-28: 2 [IU] via SUBCUTANEOUS
  Administered 2022-12-29 (×2): 8 [IU] via SUBCUTANEOUS
  Administered 2022-12-29 – 2022-12-31 (×5): 5 [IU] via SUBCUTANEOUS
  Administered 2022-12-31: 8 [IU] via SUBCUTANEOUS
  Administered 2022-12-31: 11 [IU] via SUBCUTANEOUS
  Administered 2023-01-01: 3 [IU] via SUBCUTANEOUS
  Administered 2023-01-01: 8 [IU] via SUBCUTANEOUS
  Administered 2023-01-01: 3 [IU] via SUBCUTANEOUS
  Administered 2023-01-02: 8 [IU] via SUBCUTANEOUS
  Administered 2023-01-02: 11 [IU] via SUBCUTANEOUS
  Administered 2023-01-02 – 2023-01-03 (×2): 3 [IU] via SUBCUTANEOUS
  Administered 2023-01-03 (×2): 5 [IU] via SUBCUTANEOUS
  Administered 2023-01-04 – 2023-01-05 (×4): 3 [IU] via SUBCUTANEOUS
  Administered 2023-01-05 – 2023-01-06 (×3): 5 [IU] via SUBCUTANEOUS
  Administered 2023-01-06: 8 [IU] via SUBCUTANEOUS
  Administered 2023-01-06: 5 [IU] via SUBCUTANEOUS
  Administered 2023-01-07 (×3): 3 [IU] via SUBCUTANEOUS
  Administered 2023-01-08: 5 [IU] via SUBCUTANEOUS
  Administered 2023-01-08: 3 [IU] via SUBCUTANEOUS
  Administered 2023-01-08: 5 [IU] via SUBCUTANEOUS
  Administered 2023-01-09: 3 [IU] via SUBCUTANEOUS

## 2022-12-24 MED ORDER — LISINOPRIL 10 MG PO TABS: 10.0000 mg | ORAL_TABLET | Freq: Every day | ORAL | Status: DC

## 2022-12-24 MED ORDER — TRESIBA FLEXTOUCH 100 UNIT/ML ~~LOC~~ SOPN: 30.0000 [IU] | PEN_INJECTOR | Freq: Every day | SUBCUTANEOUS | Status: DC

## 2022-12-24 MED ORDER — INSULIN ASPART 100 UNIT/ML IJ SOLN
15.0000 [IU] | Freq: Three times a day (TID) | INTRAMUSCULAR | Status: DC
  Administered 2022-12-24 – 2022-12-30 (×16): 15 [IU] via SUBCUTANEOUS

## 2022-12-24 MED ORDER — INSULIN ASPART 100 UNIT/ML IJ SOLN
0.0000 [IU] | Freq: Every day | INTRAMUSCULAR | Status: DC
  Administered 2022-12-24 – 2022-12-26 (×2): 2 [IU] via SUBCUTANEOUS
  Administered 2022-12-27: 4 [IU] via SUBCUTANEOUS
  Administered 2022-12-28: 3 [IU] via SUBCUTANEOUS
  Administered 2022-12-30: 5 [IU] via SUBCUTANEOUS
  Administered 2022-12-31 – 2023-01-03 (×3): 2 [IU] via SUBCUTANEOUS
  Administered 2023-01-04: 3 [IU] via SUBCUTANEOUS
  Administered 2023-01-05: 2 [IU] via SUBCUTANEOUS
  Administered 2023-01-06: 5 [IU] via SUBCUTANEOUS

## 2022-12-24 MED ORDER — CLOPIDOGREL BISULFATE 75 MG PO TABS
75.0000 mg | ORAL_TABLET | Freq: Every day | ORAL | Status: AC
  Administered 2022-12-25 – 2023-01-09 (×16): 75 mg via ORAL
  Filled 2022-12-24 (×16): qty 1

## 2022-12-24 MED ORDER — ASPIRIN 81 MG PO TBEC
81.0000 mg | DELAYED_RELEASE_TABLET | Freq: Every day | ORAL | Status: DC
  Administered 2022-12-25 – 2023-01-09 (×16): 81 mg via ORAL
  Filled 2022-12-24 (×16): qty 1

## 2022-12-24 MED ORDER — FLEET ENEMA RE ENEM: 1.0000 | ENEMA | Freq: Once | RECTAL | Status: DC | PRN

## 2022-12-24 MED ORDER — INSULIN GLARGINE-YFGN 100 UNIT/ML ~~LOC~~ SOLN
30.0000 [IU] | Freq: Every day | SUBCUTANEOUS | Status: DC
  Administered 2022-12-25: 30 [IU] via SUBCUTANEOUS
  Filled 2022-12-24: qty 0.3

## 2022-12-24 MED ORDER — TRAZODONE HCL 50 MG PO TABS
25.0000 mg | ORAL_TABLET | Freq: Every evening | ORAL | Status: DC | PRN
  Administered 2022-12-30 – 2023-01-05 (×3): 50 mg via ORAL
  Filled 2022-12-24 (×3): qty 1

## 2022-12-24 MED ORDER — BISACODYL 10 MG RE SUPP: 10.0000 mg | Freq: Every day | RECTAL | Status: DC | PRN

## 2022-12-24 MED ORDER — MELATONIN 3 MG PO TABS: 3.0000 mg | ORAL_TABLET | Freq: Every evening | ORAL | Status: DC | PRN

## 2022-12-24 MED ORDER — MAGNESIUM OXIDE -MG SUPPLEMENT 400 (240 MG) MG PO TABS
400.0000 mg | ORAL_TABLET | Freq: Two times a day (BID) | ORAL | Status: DC
  Administered 2022-12-24 – 2023-01-02 (×18): 400 mg via ORAL
  Filled 2022-12-24 (×18): qty 1

## 2022-12-24 MED ORDER — ALUM & MAG HYDROXIDE-SIMETH 200-200-20 MG/5ML PO SUSP: 30.0000 mL | ORAL | Status: DC | PRN

## 2022-12-24 MED ORDER — MAGNESIUM OXIDE -MG SUPPLEMENT 400 (240 MG) MG PO TABS: 400.0000 mg | ORAL_TABLET | Freq: Every day | ORAL | Status: DC

## 2022-12-24 MED ORDER — INSULIN GLARGINE-YFGN 100 UNIT/ML ~~LOC~~ SOLN
30.0000 [IU] | Freq: Every day | SUBCUTANEOUS | Status: DC
  Administered 2022-12-24: 30 [IU] via SUBCUTANEOUS
  Filled 2022-12-24: qty 0.3

## 2022-12-24 MED ORDER — MELATONIN 3 MG PO TABS
3.0000 mg | ORAL_TABLET | Freq: Every evening | ORAL | Status: DC | PRN
  Administered 2022-12-27 – 2023-01-08 (×9): 3 mg via ORAL
  Filled 2022-12-24 (×9): qty 1

## 2022-12-24 MED ORDER — ASPIRIN 81 MG PO TBEC: 81.0000 mg | DELAYED_RELEASE_TABLET | Freq: Every day | ORAL | Status: DC

## 2022-12-24 MED ORDER — ACETAMINOPHEN 325 MG PO TABS: 650.0000 mg | ORAL_TABLET | Freq: Four times a day (QID) | ORAL | Status: DC | PRN

## 2022-12-24 MED ORDER — LIVING WELL WITH DIABETES BOOK
Freq: Once | Status: AC
  Filled 2022-12-24: qty 1

## 2022-12-24 MED ORDER — CLOPIDOGREL BISULFATE 75 MG PO TABS: 75.0000 mg | ORAL_TABLET | Freq: Every day | ORAL | Status: DC

## 2022-12-24 MED ORDER — GUAIFENESIN-DM 100-10 MG/5ML PO SYRP
5.0000 mL | ORAL_SOLUTION | Freq: Four times a day (QID) | ORAL | Status: DC | PRN
  Administered 2023-01-02: 10 mL via ORAL
  Filled 2022-12-24: qty 10

## 2022-12-24 MED ORDER — ACETAMINOPHEN 325 MG PO TABS: 325.0000 mg | ORAL_TABLET | ORAL | Status: DC | PRN

## 2022-12-24 MED ORDER — PROCHLORPERAZINE 25 MG RE SUPP: 12.5000 mg | Freq: Four times a day (QID) | RECTAL | Status: DC | PRN

## 2022-12-24 MED ORDER — LISINOPRIL 20 MG PO TABS: 20.0000 mg | ORAL_TABLET | Freq: Every day | ORAL | Status: DC

## 2022-12-24 MED ORDER — ROSUVASTATIN CALCIUM 40 MG PO TABS: 40.0000 mg | ORAL_TABLET | Freq: Every evening | ORAL | Status: DC

## 2022-12-24 MED ORDER — ENOXAPARIN SODIUM 40 MG/0.4ML IJ SOSY
40.0000 mg | PREFILLED_SYRINGE | INTRAMUSCULAR | Status: DC
  Administered 2022-12-24 – 2023-01-08 (×16): 40 mg via SUBCUTANEOUS
  Filled 2022-12-24 (×16): qty 0.4

## 2022-12-24 NOTE — Progress Notes (Signed)
Fanny Dance, MD  Physician Physical Medicine and Rehabilitation   PMR Pre-admission    Signed   Date of Service: 12/24/2022 10:50 AM  Related encounter: ED to Hosp-Admission (Discharged) from 12/19/2022 in [facility]   Signed     Expand All Collapse All  PMR Admission Coordinator Pre-Admission Assessment   Patient: Lisa Werner is an 42 y.o., female MRN: [id number] DOB: 1980-11-29 Height: 5\' 2"  (157.5 cm) Weight: 97.7 kg   Insurance Information HMO:     PPO: yes     PCP:      IPA:      80/20:      OTHER:  PRIMARY: [employer] State [employer] Plan      Policy#: [id number]      Subscriber: pt CM Name: Adine Madura      Phone#: [phone]     Fax#: [phone] Pre-Cert#: [id number] auth for CIR from [facility] via fax for admit 12/21/22 with updates due to her at fax listed above on 01/03/23      Employer:  Benefits:  Phone #: [phone]     Name:  Eff. Date: 04/30/22     Deduct: $1250 ($0 met)      Out of Pocket Max: $4890 ($331.71 met)      Life Max: n/a CIR: 80%      SNF: 80% Outpatient:      Co-Pay: $10-$52 per visit Home Health: 80%      Co-Pay: 20% DME: 80%     Co-Pay: 20% Providers:  SECONDARY:       Policy#:      Phone#:    Artist:       Phone#:    The Engineer, materials Information Summary" for patients in Inpatient Rehabilitation Facilities with attached "Privacy Act Statement-Health Care Records" was provided and verbally reviewed with: Patient   Emergency Contact Information Contact Information       Name Relation Home Work Mobile    Sivert,Jerome Father [phone]             Other Contacts       Name Relation Home Work Mobile    Sivert,Donna Mother     [phone]           Current Medical History  Patient Admitting Diagnosis: CVA    History of Present Illness: Pt is a 42 y/o female with PMH of DM, HTN, and HLD admitted to [facility] on 12/19/22 from her PCP with 3 day history of R sided weakness.  In ED pt was  hemodynamically stable with R hemiparesis, NIH 4 but increased to 5, CBG 536, WBC 13.4, sodium 131, anion gap 16.  She was not given tpa due to late presentation.  CT head initially without acute abnormality, but MRI showed hypodensity in the left pons.  No LVO on MRA head/neck but did show potential aneurysm versus infundibulum in the proximal right A2.  Stroke team consulted.  Workup showed EF 65-70% with no shunt, LDL 180.  Pt was started on DAPT with clopidogrel and aspirin x3 weeks, then aspirin alone.  Therapy evaluations were completed and pt was recommended for CIR.    Complete NIHSS TOTAL: 3   Patient's medical record from [facility] has been reviewed by the rehabilitation admission coordinator and physician.   Past Medical History      Past Medical History:  Diagnosis Date   Diabetes mellitus without complication (HCC)     Hyperlipidemia     Hypertension  Has the patient had major surgery during 100 days prior to admission? No   Family History   family history includes Arthritis in her mother; Asthma in her father; Breast cancer in her maternal grandmother and paternal grandmother; Diabetes in her maternal grandmother; Kidney disease in her mother.   Current Medications  Current Medications    Current Facility-Administered Medications:     stroke: early stages of recovery book, , Does not apply, Once, Howerter, Justin B, DO   acetaminophen (TYLENOL) tablet 650 mg, 650 mg, Oral, Q6H PRN **OR** acetaminophen (TYLENOL) suppository 650 mg, 650 mg, Rectal, Q6H PRN, Howerter, Justin B, DO   aspirin EC tablet 81 mg, 81 mg, Oral, Daily, Marvel Plan, MD, 81 mg at 12/24/22 0934   clopidogrel (PLAVIX) tablet 75 mg, 75 mg, Oral, Daily, Marvel Plan, MD, 75 mg at 12/24/22 0934   insulin aspart (novoLOG) injection 0-15 Units, 0-15 Units, Subcutaneous, TID WC, Howerter, Justin B, DO, 5 Units at 12/24/22 0640   insulin aspart (novoLOG) injection 0-5 Units, 0-5 Units, Subcutaneous,  QHS, Howerter, Justin B, DO, 2 Units at 12/23/22 2203   insulin aspart (novoLOG) injection 15 Units, 15 Units, Subcutaneous, TID WC, Leandro Reasoner Tublu, MD, 15 Units at 12/24/22 0640   insulin glargine-yfgn (SEMGLEE) injection 30 Units, 30 Units, Subcutaneous, Daily, Pokhrel, Laxman, MD, 30 Units at 12/24/22 0944   magnesium oxide (MAG-OX) tablet 400 mg, 400 mg, Oral, BID, Pokhrel, Laxman, MD, 400 mg at 12/24/22 0934   melatonin tablet 3 mg, 3 mg, Oral, QHS PRN, Howerter, Justin B, DO   ondansetron (ZOFRAN) injection 4 mg, 4 mg, Intravenous, Q6H PRN, Howerter, Justin B, DO   rosuvastatin (CRESTOR) tablet 40 mg, 40 mg, Oral, Daily, Marvel Plan, MD, 40 mg at 12/24/22 0934     Patients Current Diet:  Diet Order                  Diet Carb Modified             Diet Carb Modified Fluid consistency: Thin; Room service appropriate? Yes  Diet effective now                         Precautions / Restrictions Precautions Precautions: Fall Precaution Comments: R hemi, inattention Restrictions Weight Bearing Restrictions: No    Has the patient had 2 or more falls or a fall with injury in the past year? No   Prior Activity Level Community (5-7x/wk): independent without DME, driving, working FT as a Runner, broadcasting/film/video at [employer]   Prior Functional Level Self Care: Did the patient need help bathing, dressing, using the toilet or eating? Independent   Indoor Mobility: Did the patient need assistance with walking from room to room (with or without device)? Independent   Stairs: Did the patient need assistance with internal or external stairs (with or without device)? Independent   Functional Cognition: Did the patient need help planning regular tasks such as shopping or remembering to take medications? Independent   Patient Information Are you of Hispanic, Latino/a,or Spanish origin?: A. No, not of Hispanic, Latino/a, or Spanish origin What is your race?: B. Black or African  American Do you need or want an interpreter to communicate with a doctor or health care staff?: 0. No   Patient's Response To:  Health Literacy and Transportation Is the patient able to respond to health literacy and transportation needs?: Yes Health Literacy - How often do you need to have someone help  you when you read instructions, pamphlets, or other written material from your doctor or pharmacy?: Never In the past 12 months, has lack of transportation kept you from medical appointments or from getting medications?: No In the past 12 months, has lack of transportation kept you from meetings, work, or from getting things needed for daily living?: No   Journalist, newspaper / Equipment Home Assistive Devices/Equipment: None Home Equipment: None   Prior Device Use: Indicate devices/aids used by the patient prior to current illness, exacerbation or injury? None of the above   Current Functional Level Cognition   Arousal/Alertness: Awake/alert Overall Cognitive Status: Impaired/Different from baseline Orientation Level: Oriented X4 Following Commands: Follows one step commands consistently, Follows one step commands with increased time Safety/Judgement: Decreased awareness of safety, Decreased awareness of deficits General Comments: on phone explaining she has to "go to rehab for her R side" Attention: Sustained Sustained Attention: Impaired Sustained Attention Impairment: Verbal basic Memory: Impaired Memory Impairment: Retrieval deficit Awareness: Impaired Awareness Impairment: Emergent impairment Problem Solving: Impaired Problem Solving Impairment: Verbal basic, Functional basic    Extremity Assessment (includes Sensation/Coordination)   Upper Extremity Assessment: Defer to OT evaluation RUE Deficits / Details: overall ataxic, distal vs proximal weakness, impaired FMC RUE Coordination: decreased fine motor, decreased gross motor  Lower Extremity Assessment: Defer to PT  evaluation RLE Deficits / Details: weakness, soft buckling, poor heel/toe patterning. RLE Coordination: decreased fine motor     ADLs   Overall ADL's : Needs assistance/impaired Eating/Feeding: Modified independent, Sitting Grooming: Minimal assistance, Sitting Upper Body Bathing: Minimal assistance, Sitting Lower Body Bathing: Moderate assistance Lower Body Bathing Details (indicate cue type and reason): min A sit<>stand Upper Body Dressing : Minimal assistance, Sitting Lower Body Dressing: Moderate assistance Lower Body Dressing Details (indicate cue type and reason): donning socks Toilet Transfer: Contact guard assist, Ambulation, Regular Toilet, Rolling walker (2 wheels) Toilet Transfer Details (indicate cue type and reason): simulated via functional mobility Toileting- Clothing Manipulation and Hygiene: Moderate assistance Toileting - Clothing Manipulation Details (indicate cue type and reason): min A sit<>stand Functional mobility during ADLs: Contact guard assist, Rolling walker (2 wheels) General ADL Comments: Pt ambulating in hall with CGA to min A due to unsteadiness with RW, has slight  trouble managing RW b     Mobility   Overal bed mobility: Needs Assistance Bed Mobility: Supine to Sit, Sit to Supine, Rolling Rolling: Supervision, Used rails Supine to sit: Supervision, Used rails, HOB elevated Sit to supine: Supervision, Used rails, HOB elevated General bed mobility comments: cues needed for safety, pt getting dangerously close to EOB during ADLs     Transfers   Overall transfer level: Needs assistance Equipment used: Rolling walker (2 wheels) Transfers: Sit to/from Stand Sit to Stand: Contact guard assist Bed to/from chair/wheelchair/BSC transfer type:: Stand pivot Stand pivot transfers: Min assist General transfer comment: STSx2 CGA     Ambulation / Gait / Stairs / Wheelchair Mobility   Ambulation/Gait Ambulation/Gait assistance: Mod assist Gait Distance  (Feet): 25 Feet (+ 75 ft) Assistive device: None, Rolling walker (2 wheels) Gait Pattern/deviations: Decreased step length - right, Decreased step length - left, Decreased dorsiflexion - right, Decreased dorsiflexion - left, Decreased stride length, Decreased weight shift to right General Gait Details: Pt intially ambulates bed<>door without AD with mod assist with significantly increased weight shifting to LLE to advance RLE. Pt with poor R hip/knee flexion during swing phase, absent dorsiflexion & heel strike. Pt with multiple instances of R knee buckling & LOB  requiring mod assist to correct with pt demonstrating decreased awareness. PT then provided pt with RW & pt ambulates into hallway with PT providing assistance to maintain RUE hand on RW. Pt internally & externally distracted throughout mobility, demonstrating need for increased safety & focus on gait with RW. Pt with ongoing R knee buckling although not as much as compared to gait without AD. Pt with decreased awareness of managing RW. Gait velocity: decreased Gait velocity interpretation: <1.8 ft/sec, indicate of risk for recurrent falls     Posture / Balance Dynamic Sitting Balance Sitting balance - Comments: reaches outside BOS without LOB Balance Overall balance assessment: Needs assistance Sitting-balance support: No upper extremity supported, Feet supported Sitting balance-Leahy Scale: Good Sitting balance - Comments: reaches outside BOS without LOB Standing balance support: No upper extremity supported, During functional activity, Bilateral upper extremity supported, Reliant on assistive device for balance Standing balance-Leahy Scale: Fair Standing balance comment: unilateral UE support in standing     Special needs/care consideration Diabetic management yes    Previous Home Environment (from acute therapy documentation) Living Arrangements: Alone  Lives With: Son Available Help at Discharge: Family, Available  PRN/intermittently Type of Home: Apartment Home Layout: One level Home Access: Stairs to enter Entrance Stairs-Rails: Right Entrance Stairs-Number of Steps: flight Bathroom Shower/Tub: Tub/shower unit, Engineer, building services: Standard Home Care Services: No Additional Comments: works as a Therapist, nutritional; 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 that is related to her (pt's father is taking care of him at present)   Discharge Living Setting Plans for Discharge Living Setting: Patient's home, Lives with (comment) (dad/step mom will stay with her) Type of Home at Discharge: Apartment Discharge Home Layout: One level Discharge Home Access: Stairs to enter Entrance Stairs-Rails: Can reach both Entrance Stairs-Number of Steps: 20 Discharge Bathroom Shower/Tub: Tub/shower unit Discharge Bathroom Toilet: Standard Discharge Bathroom Accessibility: Yes How Accessible: Accessible via walker Does the patient have any problems obtaining your medications?: No   Social/Family/Support Systems Patient Roles: Caregiver (providing care for an 59 y/o foster child, pt's dad is currently caring for him) Anticipated Caregiver: Judye Bos (dad) Anticipated Caregiver's Contact Information: [phone] Ability/Limitations of Caregiver: none stated Caregiver Availability: 24/7 Discharge Plan Discussed with Primary Caregiver: Yes Is Caregiver In Agreement with Plan?: Yes Does Caregiver/Family have Issues with Lodging/Transportation while Pt is in Rehab?: No   Goals Patient/Family Goal for Rehab: PT/OT/SLP supervision to mod I Expected length of stay: 12-14 days Additional Information: Discharge plan: return to either pt's home (second floor apartment) or dad's home (one level with a few steps to enter).  Pt's dad and step mom will provide 24/7 supervision to CGA if needed Pt/Family Agrees to Admission and willing to participate: Yes Program Orientation Provided & Reviewed with Pt/Caregiver Including Roles  &  Responsibilities: Yes  Barriers to Discharge: Insurance for SNF coverage, Home environment access/layout   Decrease burden of Care through IP rehab admission: n/a   Possible need for SNF placement upon discharge:  not anticipated.  Plan for discharge home with dad and step mother providing 24/7 up to CGA if needed.  Can discharge to her apartment (second floor) or parents home which is one level and fewer stairs to access.    Patient Condition: I have reviewed medical records from [facility], spoken with CSW, and patient and family member. I met with patient at the bedside for inpatient rehabilitation assessment.  Patient will benefit from ongoing PT, OT, and SLP, can actively participate in 3 hours of therapy a  day 5 days of the week, and can make measurable gains during the admission.  Patient will also benefit from the coordinated team approach during an Inpatient Acute Rehabilitation admission.  The patient will receive intensive therapy as well as Rehabilitation physician, nursing, social worker, and care management interventions.  Due to safety, skin/wound care, disease management, medication administration, pain management, and patient education the patient requires 24 hour a day rehabilitation nursing.  The patient is currently min to mod assist with mobility and basic ADLs.  Discharge setting and therapy post discharge at home with home health is anticipated.  Patient has agreed to participate in the Acute Inpatient Rehabilitation Program and will admit today.   Preadmission Screen Completed By:  Stephania Fragmin, PT, DPT 12/24/2022 10:54 AM ______________________________________________________________________   Discussed status with Dr. Natale Lay on 12/24/22 at 10:54 AM  and received approval for admission today.   Admission Coordinator:  Stephania Fragmin, PT, DPT time 10:54 AM Dorna Bloom 12/24/22     Assessment/Plan: Diagnosis: Left pontine CVA Does the need for close, 24 hr/day Medical  supervision in concert with the patient's rehab needs make it unreasonable for this patient to be served in a less intensive setting? Yes Co-Morbidities requiring supervision/potential complications: DM2, Hypomagnesemia, HTN, Leukocytosis, Dyslipidemia Due to bladder management, bowel management, safety, skin/wound care, disease management, medication administration, pain management, and patient education, does the patient require 24 hr/day rehab nursing? Yes Does the patient require coordinated care of a physician, rehab nurse, PT, OT, and SLP to address physical and functional deficits in the context of the above medical diagnosis(es)? Yes Addressing deficits in the following areas: balance, endurance, locomotion, strength, transferring, bowel/bladder control, bathing, dressing, feeding, grooming, toileting, cognition, speech, language, swallowing, and psychosocial support Can the patient actively participate in an intensive therapy program of at least 3 hrs of therapy 5 days a week? Yes The potential for patient to make measurable gains while on inpatient rehab is excellent Anticipated functional outcomes upon discharge from inpatient rehab: modified independent and supervision PT, modified independent and supervision OT, modified independent and supervision SLP Estimated rehab length of stay to reach the above functional goals is: 12-14 Anticipated discharge destination: Home 10. Overall Rehab/Functional Prognosis: excellent     MD Signature: Fanny Dance         Revision History

## 2022-12-24 NOTE — Progress Notes (Signed)
PROGRESS NOTE    Lisa Werner  ZOX:[id number] DOB: 03-Nov-1980 DOA: 12/19/2022 PCP: Julieanne Manson, MD    Brief Narrative:  42 year old female with past medical history of hypertension, poorly controlled diabetes, hyperlipidemia presented to hospital with 4-day history of right-sided weakness and hyperglycemia.  MRI was done which showed left pontine stroke.  Patient was then admitted to the hospital for further evaluation and treatment.  Patient was seen by neurology and at this time patient is awaiting for CIR placement.  Assessment & Plan:   Subacute left pontine stroke Neurology followed the patient during hospitalization.  Continue aspirin Plavix and statins.  2D echocardiogram with bubble study shows normal echocardiogram without any interatrial shunt.  Hemoglobin A1c of more than 15.5.  Hypercoagulable workup with Antithrombin III within normal range, protein C, S activity within range, protein S within normal range, lupus anticoagulant and beta-2 glycoprotein within normal range.  Cardiolipin antibodies within normal range.  Homocystine within normal range.  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  MRI with acute pontine infarct, MRI without large vessel stenosis, PT OT has recommend CIR. currently awaiting for CIR placement.   DM2 with hyperglycemia. Hemoglobin A1c more than 15.5.  Continue glargine 20 units.  Continue sliding scale insulin Accu-Cheks diabetic diet.  Will need compliance with insulin on discharge.  Latest POC glucose of 221.  Will increase glargine to 30 units from today.  Hypomagnesemia.  Improved after replacement.  Latest magnesium 1.8.   HTN on HCTZ and lisinopril at home.  Consider discontinuation of HCTZ on discharge.  Currently off antihypertensive.  Improved blood pressure.   Dyslipidemia Continue Crestor.   Leukocytosis Chronic for 5 years.  Latest WBC at 11.9 from 12.1.  Afebrile.  No signs of infection.    DVT  prophylaxis: SCDs Start: 12/19/22 1952   Code Status:     Code Status: Full Code  Disposition: CIR as per PT evaluation.  Medically stable for disposition.  Status is: Inpatient  Remains inpatient appropriate because: Need for inpatient rehabilitation   Family Communication: None at bedside  Consultants:  Neurology  Procedures:  None  Antimicrobials:  None  Anti-infectives (From admission, onward)    None       Subjective: Today, patient was seen and examined at bedside.  Complains of mild weakness.  No headache nausea vomiting.  Wants to eat breakfast.   Objective: Vitals:   12/23/22 2000 12/24/22 0000 12/24/22 0400 12/24/22 0741  BP: 136/86 130/88 120/68 111/70  Pulse: 92 90 82 87  Resp: 18 18 18 18   Temp: 98.7 F (37.1 C) 98.5 F (36.9 C) 98.1 F (36.7 C) 98.2 F (36.8 C)  TempSrc: Oral Oral Oral Oral  SpO2: 96% 98% 100% 98%  Weight:      Height:        Intake/Output Summary (Last 24 hours) at 12/24/2022 0915 Last data filed at 12/24/2022 0100 Gross per 24 hour  Intake 295 ml  Output 950 ml  Net -655 ml   Filed Weights   12/21/22 0500 12/22/22 0500 12/23/22 0500  Weight: 99.5 kg 98.2 kg 97.7 kg    Physical Examination: Body mass index is 39.4 kg/m.   General: Obese built, not in obvious distress, alert awake and Communicative, HENT:   No scleral pallor or icterus noted. Oral mucosa is moist.  Chest:  Clear breath sounds.  No crackles or wheezes.  CVS: S1 &S2 heard. No murmur.  Regular rate and rhythm. Abdomen: Soft, nontender, nondistended.  Bowel sounds are heard.   Extremities: No cyanosis, clubbing or edema.  Peripheral pulses are palpable. Psych: Alert, awake oriented, flat affect, slow to respond CNS:  No cranial nerve deficits.  Mild right-sided weakness noted. Skin: Warm and dry.  No rashes noted.  Data Reviewed:   CBC: Recent Labs  Lab 12/19/22 1136 12/19/22 1258 12/20/22 0133 12/22/22 0540 12/23/22 0513  WBC 13.4*  --   11.6* 12.1* 11.9*  NEUTROABS 11.0*  --  7.6  --   --   HGB 13.0 13.6 13.2 11.7* 12.1  HCT 40.3 40.0 41.8 37.2 37.7  MCV 90.8  --  89.5 90.5 88.3  PLT 292  --  295 265 307    Basic Metabolic Panel: Recent Labs  Lab 12/19/22 1136 12/19/22 1258 12/20/22 0133 12/22/22 0540 12/23/22 0513  NA 131* 130* 133* 133* 134*  K 4.4 4.3 3.5 3.9 3.9  CL 95*  --  101 103 105  CO2 20*  --  21* 20* 22  GLUCOSE 402*  --  279* 254* 171*  BUN 9  --  7 10 9   CREATININE 1.00  --  0.80 0.98 0.84  CALCIUM 9.2  --  8.8* 8.3* 9.0  MG 1.7  --  1.9 1.6* 1.8    Liver Function Tests: Recent Labs  Lab 12/19/22 1136 12/20/22 0133  AST 22 24  ALT 23 21  ALKPHOS 94 87  BILITOT 0.8 0.7  PROT 7.1 7.1  ALBUMIN 3.0* 2.8*     Radiology Studies: No results found.    LOS: 4 days    Joycelyn Das, MD [facility] Available via Epic secure chat 7am-7pm After these hours, please refer to coverage provider listed on [email or web address] 12/24/2022, 9:15 AM

## 2022-12-24 NOTE — TOC Transition Note (Signed)
Transition of Care [facility]) - CM/SW Discharge Note   Patient Details  Name: Lisa Werner MRN: [id number] Date of Birth: [birth year]  [facility] CM/SW Contact:  Kermit Balo, RN Phone Number: 12/24/2022, 10:14 AM   Clinical Narrative:    Pt is discharging to CIR today. CM signing off.    Final next level of care: IP Rehab Facility Barriers to Discharge: No Barriers Identified   Patient Goals and CMS Choice CMS Medicare.gov Compare Post Acute Care list provided to:: Patient Choice offered to / list presented to : Patient  Discharge Placement                         Discharge Plan and Services Additional resources added to the After Visit Summary for                                       Social Determinants of Health (SDOH) Interventions SDOH Screenings   Food Insecurity: No Food Insecurity (12/20/2022)  Housing: Low Risk  (12/20/2022)  Transportation Needs: No Transportation Needs (12/20/2022)  Utilities: Not At Risk (12/20/2022)  Depression (PHQ2-9): Medium Risk (11/19/2018)  Financial Resource Strain: Patient Declined (10/09/2022)   Received from [facility]  Physical Activity: Unknown (07/03/2022)   Received from [facility]  Social Connections: Socially Integrated (07/03/2022)   Received from [facility]  Stress: Patient Declined (07/03/2022)   Received from [facility]  Tobacco Use: Low Risk  (12/20/2022)     Readmission Risk Interventions     No data to display

## 2022-12-24 NOTE — H&P (Addendum)
Physical Medicine and Rehabilitation Admission H&P        Chief Complaint  Patient presents with   Functional decline due to stroke.       HPI:  Lisa Werner is a 42 year old RH- female with history of LADA/TIDM, HTN, morbid obesity who was admitted on 12/19/22 with four day history of right sided weakness with difficulty walking. She was sent over from MD office and BS 536 per EMS (had missed am dose insulin). CTA head/neck negative for LVO with area of caliber change L-V2 segment concerning for dissection. [facility] Kitchen MRI brain done revealing acute infarct in left pons and MRA negative for occulusion and 1-2 mm outpouching from right A2 question tiny aneurysm v/s infundibulum.  2D echo showed EF 65-70% with moderate LVH, trivial TVR and negative bubble study.  Patient reports she had slurred speech initially but this has improved.  Neurology feels like her stroke is most likely due to small vessel disease.  Dr. Cherie Dark recommended DAPT for stroke due to small vessel disease/uncontrolled risk factors for 3 weeks and then aspirin alone.     Review of Systems  Constitutional:  Negative for chills and fever.  HENT:  Negative for hearing loss.   Eyes:  Positive for blurred vision and double vision.  Respiratory:  Negative for cough.   Cardiovascular:  Negative for chest pain and palpitations.  Gastrointestinal:  Positive for constipation. Negative for abdominal pain, heartburn and nausea.       Has not had BM since admission  Genitourinary:  Negative for dysuria and frequency.  Musculoskeletal:  Negative for back pain, joint pain and myalgias.  Skin:  Negative for rash.  Neurological:  Positive for focal weakness. Negative for dizziness and sensory change.  Psychiatric/Behavioral:  Negative for memory loss. The patient is not nervous/anxious and does not have insomnia.           Past Medical History:  Diagnosis Date   Diabetes mellitus without complication (HCC)     Hyperlipidemia      Hypertension            History reviewed. No pertinent surgical history.              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 She reports that she has never smoked. She has never used smokeless tobacco. She reports that she does not drink alcohol and does not use drugs.     Allergies      Allergies  Allergen Reactions   Pollen Extract                Medications Prior to Admission  Medication Sig Dispense Refill   albuterol (VENTOLIN HFA) 108 (90 Base) MCG/ACT inhaler Inhale 2 puffs into the lungs every 6 (six) hours as needed for wheezing.       hydrochlorothiazide (HYDRODIURIL) 12.5 MG tablet Take 12.5 mg by mouth in the morning.       insulin aspart (NOVOLOG FLEXPEN) 100 UNIT/ML FlexPen Inject 4 units before meals 3 times daily if  blood glucose is greater than 300 (Patient taking differently: Inject 15 Units into the skin 3 (three) times daily with meals.) 6 mL 11   rosuvastatin (CRESTOR) 20 MG tablet Take 20 mg by mouth at bedtime.       TRESIBA FLEXTOUCH 100 UNIT/ML FlexTouch Pen Inject 16 Units into the skin at bedtime.       AgaMatrix Ultra-Thin Lancets MISC Check blood glucose before meals 3 times daily. 100 each 11   Blood Glucose Monitoring Suppl (AGAMATRIX PRESTO) w/Device KIT Check blood glucose 3 times daily before meals 1 kit 0   fluconazole (DIFLUCAN) 150 MG tablet 1 tab by mouth daily for 3 days. (Patient not taking: Reported on 03/04/2019) 3 tablet 0   glucose blood (AGAMATRIX PRESTO TEST) test strip Check blood glucose 3 times daily before meals 100 each 12   Insulin Glargine (LANTUS SOLOSTAR) 100 UNIT/ML Solostar Pen 50 units  injected subcutaneously every morning before breakfast (Patient not taking: Reported on 12/19/2022) 5 pen 11   Insulin Syringe-Needle U-100 (INSULIN SYRINGE 1CC/30GX1/2") 30G X 1/2" 1 ML MISC 1 Units by Does not apply route 4 (four) times daily -  before meals and at bedtime. 100 each 5   lisinopril (ZESTRIL) 10 MG tablet 1 tab by mouth daily after out of 5 mg tabs (Patient not taking: Reported on 12/19/2022) 30 tablet 11   pravastatin (PRAVACHOL) 20 MG tablet Take 1 tablet (20 mg total) by mouth daily. (Patient not taking: Reported on 11/19/2018) 30 tablet 2      Home: Home Living Family/patient expects to be discharged to:: Private residence Living Arrangements: Alone Available Help at Discharge: Family, Available PRN/intermittently Type of Home: Apartment Home Access: Stairs to enter Secretary/administrator of Steps: flight Entrance Stairs-Rails: Right Home Layout: One level Bathroom Shower/Tub: Tub/shower unit, Engineer, building services: Standard Home Equipment: None Additional Comments: works as a Therapist, nutritional; 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 that is related to her (pt's father is taking care of him at present)  Lives With: Son   Functional History: Prior Function Prior Level of Function : Working/employed, Driving, Independent/Modified Independent   Functional Status:  Mobility: Bed Mobility Overal bed mobility: Needs Assistance Bed Mobility: Supine to Sit, Sit to Supine, Rolling Rolling: Supervision, Used rails Supine to sit: Supervision, Used rails, HOB elevated Sit to supine: Supervision, Used rails, HOB elevated General bed mobility comments: cues needed for safety, pt getting dangerously close to EOB during ADLs Transfers Overall transfer level: Needs assistance Equipment used: Rolling walker (2 wheels) Transfers: Sit to/from Stand Sit to Stand: Contact guard assist Bed to/from chair/wheelchair/BSC transfer type:: Stand pivot Stand pivot transfers: Min assist General  transfer comment: STSx2 CGA Ambulation/Gait Ambulation/Gait assistance: Mod assist Gait Distance (Feet): 25 Feet (+ 75 ft) Assistive device: None, Rolling walker (2 wheels) Gait Pattern/deviations: Decreased step length - right, Decreased step length - left, Decreased dorsiflexion - right, Decreased dorsiflexion - left, Decreased stride length, Decreased weight shift to right General Gait Details: Pt intially ambulates bed<>door without AD with mod assist with significantly increased weight shifting to LLE to advance RLE. Pt with poor R hip/knee flexion during swing phase, absent dorsiflexion & heel strike. Pt with multiple instances of R knee buckling & LOB requiring mod assist to correct with pt demonstrating decreased awareness. PT then provided pt with RW & pt ambulates into hallway with PT providing assistance to maintain RUE hand on RW. Pt internally & externally distracted throughout mobility, demonstrating need for increased safety & focus on  gait with RW. Pt with ongoing R knee buckling although not as much as compared to gait without AD. Pt with decreased awareness of managing RW. Gait velocity: decreased Gait velocity interpretation: <1.8 ft/sec, indicate of risk for recurrent falls   ADL: ADL Overall ADL's : Needs assistance/impaired Eating/Feeding: Modified independent, Sitting Grooming: Minimal assistance, Sitting Upper Body Bathing: Minimal assistance, Sitting Lower Body Bathing: Moderate assistance Lower Body Bathing Details (indicate cue type and reason): min A sit<>stand Upper Body Dressing : Minimal assistance, Sitting Lower Body Dressing: Moderate assistance Lower Body Dressing Details (indicate cue type and reason): donning socks Toilet Transfer: Contact guard assist, Ambulation, Regular Toilet, Rolling walker (2 wheels) Toilet Transfer Details (indicate cue type and reason): simulated via functional mobility Toileting- Clothing Manipulation and Hygiene: Moderate  assistance Toileting - Clothing Manipulation Details (indicate cue type and reason): min A sit<>stand Functional mobility during ADLs: Contact guard assist, Rolling walker (2 wheels) General ADL Comments: Pt ambulating in hall with CGA to min A due to unsteadiness with RW, has slight  trouble managing RW b   Cognition: Cognition Overall Cognitive Status: Impaired/Different from baseline Arousal/Alertness: Awake/alert Orientation Level: Oriented X4 Attention: Sustained Sustained Attention: Impaired Sustained Attention Impairment: Verbal basic Memory: Impaired Memory Impairment: Retrieval deficit Awareness: Impaired Awareness Impairment: Emergent impairment Problem Solving: Impaired Problem Solving Impairment: Verbal basic, Functional basic Cognition Arousal: Alert Behavior During Therapy: Flat affect Overall Cognitive Status: Impaired/Different from baseline Area of Impairment: Safety/judgement, Problem solving Memory: Decreased short-term memory Following Commands: Follows one step commands consistently Safety/Judgement: Decreased awareness of safety, Decreased awareness of deficits Awareness: Emergent Problem Solving: Slow processing, Difficulty sequencing, Requires verbal cues, Requires tactile cues General Comments: on phone explaining she has to "go to rehab for her R side"     Blood pressure 111/70, pulse 87, temperature 98.2 F (36.8 C), temperature source Oral, resp. rate 18, height 5\' 2"  (1.575 m), weight 97.7 kg, SpO2 98%.        General: NAD sitting in chair HEENT: Head is normocephalic, atraumatic, PERRLA, EOMI, sclera anicteric, oral mucosa pink and moist Neck: Supple without JVD or lymphadenopathy Heart: Reg rate and rhythm. No murmurs rubs or gallops Chest: CTA bilaterally without wheezes, rales, or rhonchi; no distress Abdomen: Soft, non-tender, non-distended, bowel sounds positive. Extremities: No clubbing, cyanosis, or edema. Pulses are 2+ Psych: Pt's  affect is a little flat. Pt is cooperative Skin: Clean and intact without signs of breakdown Neuro: Alert and oriented x 3, she is able to provide a clear and coherent history, follows commands, cranial nerves II through XII intact besides shoulder shrug is a little decreased on the right, able to name and repeat, no dysarthria or aphasia noted RUE: 4-/5 Deltoid, 4-/5 Biceps, 4-/5 Triceps,  4-/5 Grip LUE: 5/5 Deltoid, 5/5 Biceps, 5/5 Triceps, 5/5 Wrist Ext, 5/5 Grip RLE: HF 4-/5, KE 4-/5, ADF 4-/5, APF 4-/5 LLE: HF 5/5, KE 5/5, ADF 5/5, APF 5/5 Sensory exam normal for light touch and pain in all 4 limbs.  No abnormal tone appreciated.  Finger-nose intact on the left, more difficulty on the right due to weakness Musculoskeletal: No joint swelling or tenderness noted Normal muscle bulk   Lab Results Last 48 Hours        Results for orders placed or performed during the hospital encounter of 12/19/22 (from the past 48 hour(s))  Glucose, capillary     Status: Abnormal    Collection Time: 12/22/22 11:37 AM  Result Value Ref Range    Glucose-Capillary 220 (  H) 70 - 99 mg/dL      Comment: Glucose reference range applies only to samples taken after fasting for at least 8 hours.  Glucose, capillary     Status: Abnormal    Collection Time: 12/22/22  5:08 PM  Result Value Ref Range    Glucose-Capillary 190 (H) 70 - 99 mg/dL      Comment: Glucose reference range applies only to samples taken after fasting for at least 8 hours.  Glucose, capillary     Status: Abnormal    Collection Time: 12/22/22  9:13 PM  Result Value Ref Range    Glucose-Capillary 222 (H) 70 - 99 mg/dL      Comment: Glucose reference range applies only to samples taken after fasting for at least 8 hours.    Comment 1 Notify RN    Basic metabolic panel     Status: Abnormal    Collection Time: 12/23/22  5:13 AM  Result Value Ref Range    Sodium 134 (L) 135 - 145 mmol/L    Potassium 3.9 3.5 - 5.1 mmol/L    Chloride 105 98 - 111  mmol/L    CO2 22 22 - 32 mmol/L    Glucose, Bld 171 (H) 70 - 99 mg/dL      Comment: Glucose reference range applies only to samples taken after fasting for at least 8 hours.    BUN 9 6 - 20 mg/dL    Creatinine, Ser 4.09 0.44 - 1.00 mg/dL    Calcium 9.0 8.9 - 81.1 mg/dL    GFR, Estimated >91 >47 mL/min      Comment: (NOTE) Calculated using the CKD-EPI Creatinine Equation (2021)      Anion gap 7 5 - 15      Comment: Performed at [facility], [address] [place], [place] [address]  CBC     Status: Abnormal    Collection Time: 12/23/22  5:13 AM  Result Value Ref Range    WBC 11.9 (H) 4.0 - 10.5 K/uL    RBC 4.27 3.87 - 5.11 MIL/uL    Hemoglobin 12.1 12.0 - 15.0 g/dL    HCT 21.3 08.6 - 57.8 %    MCV 88.3 80.0 - 100.0 fL    MCH 28.3 26.0 - 34.0 pg    MCHC 32.1 30.0 - 36.0 g/dL    RDW 46.9 62.9 - 52.8 %    Platelets 307 150 - 400 K/uL    nRBC 0.0 0.0 - 0.2 %      Comment: Performed at [facility], [address], [place], [place] [address]  Magnesium     Status: None    Collection Time: 12/23/22  5:13 AM  Result Value Ref Range    Magnesium 1.8 1.7 - 2.4 mg/dL      Comment: Performed at [facility], [address], [place], [place] [address]  Glucose, capillary     Status: Abnormal    Collection Time: 12/23/22  6:18 AM  Result Value Ref Range    Glucose-Capillary 191 (H) 70 - 99 mg/dL      Comment: Glucose reference range applies only to samples taken after fasting for at least 8 hours.    Comment 1 Notify RN    Glucose, capillary     Status: Abnormal    Collection Time: 12/23/22 12:38 PM  Result Value Ref Range    Glucose-Capillary 175 (H) 70 - 99 mg/dL      Comment:  Glucose reference range applies only to samples taken after fasting for at least 8 hours.    Comment 1 Notify RN      Comment 2 Document in Chart    Glucose, capillary     Status: Abnormal    Collection Time: 12/23/22  4:25 PM  Result Value Ref Range    Glucose-Capillary 182 (H) 70 - 99  mg/dL      Comment: Glucose reference range applies only to samples taken after fasting for at least 8 hours.  Glucose, capillary     Status: Abnormal    Collection Time: 12/23/22  9:34 PM  Result Value Ref Range    Glucose-Capillary 224 (H) 70 - 99 mg/dL      Comment: Glucose reference range applies only to samples taken after fasting for at least 8 hours.  Glucose, capillary     Status: Abnormal    Collection Time: 12/24/22  6:18 AM  Result Value Ref Range    Glucose-Capillary 221 (H) 70 - 99 mg/dL      Comment: Glucose reference range applies only to samples taken after fasting for at least 8 hours.    Comment 1 Notify RN      Comment 2 Document in Chart        Imaging Results (Last 48 hours)  No results found.         Blood pressure 111/70, pulse 87, temperature 98.2 F (36.8 C), temperature source Oral, resp. rate 18, height 5\' 2"  (1.575 m), weight 97.7 kg, SpO2 98%.   Medical Problem List and Plan: 1. 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  Antithrombotics: -DVT/anticoagulation:  Pharmaceutical: Lovenox added             -antiplatelet therapy: DAPT X 3 weeks followed by ASA alone 3. Pain Management:  N/A 4. Mood/Behavior/Sleep: LCSW to follow for evaluation and support             -antipsychotic agents: N/A 5. Neuropsych/cognition: This patient is capable of making decisions on her own behalf. 6. Skin/Wound Care: Routine pressure relief measures.  7. Fluids/Electrolytes/Nutrition: Monitor I/O. Check CMET in am.  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 On Tresiba 16 units w/ Novolog 20 units TID ac/ Endo @ [facility]             --Continue Insulin gargline 30 units with 15 units TID ac --Monitor BS ac/hs and continue to educate on compliance, risk factors/benefits. She didn't take insulin  consistently and does not wear CGM due to "excessive beeping" 9. HTN: Monitor BP TID- continue to hold Lisinopril and hydrochlorothiazide.  Long-term goal normotensive --BP controlled currently  10. Hyponatremia: Question chronic @131 -132 range per chart review             --recheck BMET in am  11. Hyperlipidemia: Trig-245/LDL-180. Now on increased dose Crestor 40 mg.  Dietary education 12. Hypomagnesemia: Recheck in am. Now on Mag ox daily.  13. Chronic leucocytosis: Trend WBC and monitor for signs of infection.              --improved from 13.4 to 11.9 14. Morbid obesity: BMI- 39.4. CM/HH diet.  RD consulted to review diet.  --Educate patient on importance of weight loss and exercise to help promote health, improve AIC and mobility.  15. Constipation: Declines need for laxative. Advised that prns on board. Monitor for now.        Jacquelynn Cree, [name] 12/24/2022   I have personally performed a face to face diagnostic evaluation of this patient and formulated the key components of the plan.  Additionally, I have personally reviewed laboratory data, imaging studies, as well as relevant notes and concur with the physician assistant's documentation above.   The patient's status has not changed from the original H&P.  Any changes in documentation from the acute care chart have been noted above.   Fanny Dance, MD, [place] [facility]

## 2022-12-24 NOTE — Progress Notes (Signed)
Physical Therapy Treatment Patient Details Name: Lisa Werner MRN: [id number] DOB: 1980-08-21 Today's Date: 12/24/2022   History of Present Illness 42 y.o. female presented from home to [facility] ED complaining of right-sided weakness. 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 PHMx: diabetes mellitus 2, hypertension, hyperlipidemia.    PT Comments  Patient received in bed, she is agreeable to PT session. Asking when she is going to rehab? She requires supervision for bed mobility and min A for sit to stand transfers with cues/assist for R hand placement on walker. She is able to ambulate ~75 feet with Rolling walker and mod A for safety and to keep R UE on walker. Cues for safety. Seated R LE exercises performed in recliner. Patient will continue to benefit from skilled PT to improve independence and safety with mobility.        If plan is discharge home, recommend the following: Assistance with cooking/housework;Help with stairs or ramp for entrance;A lot of help with walking and/or transfers;A lot of help with bathing/dressing/bathroom;Assist for transportation   Can travel by private vehicle        Equipment Recommendations  Other (comment) (TBD)    Recommendations for Other Services Rehab consult     Precautions / Restrictions Precautions Precautions: Fall Precaution Comments: R hemi, inattention Restrictions Weight Bearing Restrictions: No     Mobility  Bed Mobility Overal bed mobility: Needs Assistance Bed Mobility: Supine to Sit     Supine to sit: Used rails, Supervision          Transfers Overall transfer level: Needs assistance Equipment used: Rolling walker (2 wheels) Transfers: Sit to/from Stand Sit to Stand: Contact guard assist           General transfer comment: Cues needed to place R hand on walker. Safety.    Ambulation/Gait Ambulation/Gait assistance: Mod assist Gait Distance (Feet): 75 Feet Assistive device: Rolling walker (2  wheels) Gait Pattern/deviations: Step-through pattern, Decreased weight shift to right, Trunk flexed, Drifts right/left Gait velocity: decreased     General Gait Details: Patient ambulated with RW ~75 feet, requires mod A for safety, to keep Right hand on walker and to direct walker at times. She veers to right in hallway. R knee buckling during ambulating and increases with fatigue.   Stairs             Wheelchair Mobility     Tilt Bed    Modified Rankin (Stroke Patients Only) Modified Rankin (Stroke Patients Only) Pre-Morbid Rankin Score: No symptoms Modified Rankin: Moderately severe disability     Balance Overall balance assessment: Needs assistance Sitting-balance support: Feet supported Sitting balance-Leahy Scale: Good     Standing balance support: Bilateral upper extremity supported, During functional activity, Reliant on assistive device for balance Standing balance-Leahy Scale: Poor Standing balance comment: required AD and assist                            Cognition Arousal: Alert Behavior During Therapy: Flat affect Overall Cognitive Status: Impaired/Different from baseline Area of Impairment: Safety/judgement, Problem solving                       Following Commands: Follows one step commands consistently, Follows one step commands with increased time Safety/Judgement: Decreased awareness of safety, Decreased awareness of deficits Awareness: Emergent Problem Solving: Slow processing, Difficulty sequencing, Requires verbal cues, Requires tactile cues, Decreased initiation  Exercises Other Exercises Other Exercises: LAQ x 10, seated marching x 10, AP x 10, hip abd/add x 8 with cues for technique and maximal muscle activation    General Comments        Pertinent Vitals/Pain Pain Assessment Pain Assessment: No/denies pain    Home Living                          Prior Function            PT Goals  (current goals can now be found in the care plan section) Acute Rehab PT Goals Patient Stated Goal: back to work as soon as possible. PT Goal Formulation: With patient Time For Goal Achievement: 01/03/23 Potential to Achieve Goals: Fair Progress towards PT goals: Progressing toward goals    Frequency    Min 1X/week      PT Plan      Co-evaluation              AM-PAC PT "6 Clicks" Mobility   Outcome Measure  Help needed turning from your back to your side while in a flat bed without using bedrails?: A Little Help needed moving from lying on your back to sitting on the side of a flat bed without using bedrails?: A Little Help needed moving to and from a bed to a chair (including a wheelchair)?: A Little Help needed standing up from a chair using your arms (e.g., wheelchair or bedside chair)?: A Little Help needed to walk in hospital room?: A Lot Help needed climbing 3-5 steps with a railing? : A Lot 6 Click Score: 16    End of Session Equipment Utilized During Treatment: Gait belt Activity Tolerance: Patient tolerated treatment well;Patient limited by fatigue Patient left: in chair;with call bell/phone within reach;with chair alarm set Nurse Communication: Mobility status PT Visit Diagnosis: Unsteadiness on feet (R26.81);Muscle weakness (generalized) (M62.81);Hemiplegia and hemiparesis;Difficulty in walking, not elsewhere classified (R26.2) Hemiplegia - Right/Left: Right Hemiplegia - dominant/non-dominant: Dominant Hemiplegia - caused by: Cerebral infarction     Time: 1610-9604 PT Time Calculation (min) (ACUTE ONLY): 19 min  Charges:    $Gait Training: 8-22 mins PT General Charges $$ ACUTE PT VISIT: 1 Visit                     Hisao Doo, PT, GCS 12/24/22,11:01 AM

## 2022-12-24 NOTE — H&P (Signed)
Physical Medicine and Rehabilitation Admission H&P    Chief Complaint  Patient presents with   Functional decline due to stroke.     HPI:  Lisa Werner is a 42 year old RH- female with history of LADA/TIDM, HTN, morbid obesity who was admitted on 12/19/22 with four day history of right sided weakness with difficulty walking. She was sent over from MD office and BS 536 per EMS (had missed am dose insulin). CTA head/neck negative for LVO with area of caliber change L-V2 segment concerning for dissection. [facility] Kitchen MRI brain done revealing acute infarct in left pons and MRA negative for occulusion and 1-2 mm outpouching from right A2 question tiny aneurysm v/s infundibulum.  2D echo showed EF 65-70% with moderate LVH, trivial TVR and negative bubble study.  Patient reports she had slurred speech initially but this has improved.  Neurology feels like her stroke is most likely due to small vessel disease.  Dr. Cherie Dark recommended DAPT for stroke due to small vessel disease/uncontrolled risk factors for 3 weeks and then aspirin alone.   Review of Systems  Constitutional:  Negative for chills and fever.  HENT:  Negative for hearing loss.   Eyes:  Positive for blurred vision and double vision.  Respiratory:  Negative for cough.   Cardiovascular:  Negative for chest pain and palpitations.  Gastrointestinal:  Positive for constipation. Negative for abdominal pain, heartburn and nausea.       Has not had BM since admission  Genitourinary:  Negative for dysuria and frequency.  Musculoskeletal:  Negative for back pain, joint pain and myalgias.  Skin:  Negative for rash.  Neurological:  Positive for focal weakness. Negative for dizziness and sensory change.  Psychiatric/Behavioral:  Negative for memory loss. The patient is not nervous/anxious and does not have insomnia.     Past Medical History:  Diagnosis Date   Diabetes mellitus without complication (HCC)    Hyperlipidemia    Hypertension      History reviewed. No pertinent surgical history.   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 She reports that she has never smoked. She has never used smokeless tobacco. She reports that she does not drink alcohol and does not use drugs.   Allergies  Allergen Reactions   Pollen Extract     Medications Prior to Admission  Medication Sig Dispense Refill   albuterol (VENTOLIN HFA) 108 (90 Base) MCG/ACT inhaler Inhale 2 puffs into the lungs every 6 (six) hours as needed for wheezing.     hydrochlorothiazide (HYDRODIURIL) 12.5 MG tablet Take 12.5 mg by mouth in the morning.     insulin aspart (NOVOLOG FLEXPEN) 100 UNIT/ML FlexPen Inject 4 units before meals 3 times daily if blood glucose is greater than 300 (Patient taking differently: Inject 15 Units into the skin 3 (three) times daily with meals.) 6 mL 11   rosuvastatin (CRESTOR) 20 MG tablet Take 20 mg by mouth at bedtime.     TRESIBA FLEXTOUCH 100 UNIT/ML FlexTouch Pen Inject 16 Units into the skin at bedtime.     AgaMatrix Ultra-Thin Lancets MISC Check blood glucose before meals 3 times  daily. 100 each 11   Blood Glucose Monitoring Suppl (AGAMATRIX PRESTO) w/Device KIT Check blood glucose 3 times daily before meals 1 kit 0   fluconazole (DIFLUCAN) 150 MG tablet 1 tab by mouth daily for 3 days. (Patient not taking: Reported on 03/04/2019) 3 tablet 0   glucose blood (AGAMATRIX PRESTO TEST) test strip Check blood glucose 3 times daily before meals 100 each 12   Insulin Glargine (LANTUS SOLOSTAR) 100 UNIT/ML Solostar Pen 50 units injected subcutaneously every morning before breakfast (Patient not taking: Reported on 12/19/2022) 5  pen 11   Insulin Syringe-Needle U-100 (INSULIN SYRINGE 1CC/30GX1/2") 30G X 1/2" 1 ML MISC 1 Units by Does not apply route 4 (four) times daily -  before meals and at bedtime. 100 each 5   lisinopril (ZESTRIL) 10 MG tablet 1 tab by mouth daily after out of 5 mg tabs (Patient not taking: Reported on 12/19/2022) 30 tablet 11   pravastatin (PRAVACHOL) 20 MG tablet Take 1 tablet (20 mg total) by mouth daily. (Patient not taking: Reported on 11/19/2018) 30 tablet 2    Home: Home Living Family/patient expects to be discharged to:: Private residence Living Arrangements: Alone Available Help at Discharge: Family, Available PRN/intermittently Type of Home: Apartment Home Access: Stairs to enter Secretary/administrator of Steps: flight Entrance Stairs-Rails: Right Home Layout: One level Bathroom Shower/Tub: Tub/shower unit, Engineer, building services: Standard Home Equipment: None Additional Comments: works as a Therapist, nutritional; 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 that is related to her (pt's father is taking care of him at present)  Lives With: Son   Functional History: Prior Function Prior Level of Function : Working/employed, Driving, Independent/Modified Independent  Functional Status:  Mobility: Bed Mobility Overal bed mobility: Needs Assistance Bed Mobility: Supine to Sit, Sit to Supine, Rolling Rolling: Supervision, Used rails Supine to sit: Supervision, Used rails, HOB elevated Sit to supine: Supervision, Used rails, HOB elevated General bed mobility comments: cues needed for safety, pt getting dangerously close to EOB during ADLs Transfers Overall transfer level: Needs assistance Equipment used: Rolling walker (2 wheels) Transfers: Sit to/from Stand Sit to Stand: Contact guard assist Bed to/from chair/wheelchair/BSC transfer type:: Stand pivot Stand pivot transfers: Min assist General transfer comment: STSx2 CGA Ambulation/Gait Ambulation/Gait assistance: Mod assist Gait Distance (Feet):  25 Feet (+ 75 ft) Assistive device: None, Rolling walker (2 wheels) Gait Pattern/deviations: Decreased step length - right, Decreased step length - left, Decreased dorsiflexion - right, Decreased dorsiflexion - left, Decreased stride length, Decreased weight shift to right General Gait Details: Pt intially ambulates bed<>door without AD with mod assist with significantly increased weight shifting to LLE to advance RLE. Pt with poor R hip/knee flexion during swing phase, absent dorsiflexion & heel strike. Pt with multiple instances of R knee buckling & LOB requiring mod assist to correct with pt demonstrating decreased awareness. PT then provided pt with RW & pt ambulates into hallway with PT providing assistance to maintain RUE hand on RW. Pt internally & externally distracted throughout mobility, demonstrating need for increased safety & focus on gait with RW. Pt with ongoing R knee buckling although not as much as compared to gait without AD. Pt with decreased awareness of managing RW. Gait velocity: decreased Gait velocity interpretation: <1.8 ft/sec, indicate of risk for recurrent falls    ADL: ADL Overall ADL's : Needs assistance/impaired Eating/Feeding: Modified independent, Sitting Grooming: Minimal assistance, Sitting Upper Body Bathing: Minimal assistance, Sitting Lower Body Bathing: Moderate assistance Lower Body Bathing Details (indicate cue type and reason):  min A sit<>stand Upper Body Dressing : Minimal assistance, Sitting Lower Body Dressing: Moderate assistance Lower Body Dressing Details (indicate cue type and reason): donning socks Toilet Transfer: Contact guard assist, Ambulation, Regular Toilet, Rolling walker (2 wheels) Toilet Transfer Details (indicate cue type and reason): simulated via functional mobility Toileting- Clothing Manipulation and Hygiene: Moderate assistance Toileting - Clothing Manipulation Details (indicate cue type and reason): min A sit<>stand Functional  mobility during ADLs: Contact guard assist, Rolling walker (2 wheels) General ADL Comments: Pt ambulating in hall with CGA to min A due to unsteadiness with RW, has slight  trouble managing RW b  Cognition: Cognition Overall Cognitive Status: Impaired/Different from baseline Arousal/Alertness: Awake/alert Orientation Level: Oriented X4 Attention: Sustained Sustained Attention: Impaired Sustained Attention Impairment: Verbal basic Memory: Impaired Memory Impairment: Retrieval deficit Awareness: Impaired Awareness Impairment: Emergent impairment Problem Solving: Impaired Problem Solving Impairment: Verbal basic, Functional basic Cognition Arousal: Alert Behavior During Therapy: Flat affect Overall Cognitive Status: Impaired/Different from baseline Area of Impairment: Safety/judgement, Problem solving Memory: Decreased short-term memory Following Commands: Follows one step commands consistently Safety/Judgement: Decreased awareness of safety, Decreased awareness of deficits Awareness: Emergent Problem Solving: Slow processing, Difficulty sequencing, Requires verbal cues, Requires tactile cues General Comments: on phone explaining she has to "go to rehab for her R side"   Blood pressure 111/70, pulse 87, temperature 98.2 F (36.8 C), temperature source Oral, resp. rate 18, height 5\' 2"  (1.575 m), weight 97.7 kg, SpO2 98%. Physical Exam Vitals and nursing note reviewed.  Constitutional:      Appearance: Normal appearance. She is obese.  Neurological:     Mental Status: She is alert.     Comments: Speech soft but clear. Able to answer orientation and basic biographic questions without difficulty. Had some difficulty with higher level addition.   Psychiatric:     Comments: Flat affect      General: NAD sitting in chair HEENT: Head is normocephalic, atraumatic, PERRLA, EOMI, sclera anicteric, oral mucosa pink and moist Neck: Supple without JVD or lymphadenopathy Heart: Reg  rate and rhythm. No murmurs rubs or gallops Chest: CTA bilaterally without wheezes, rales, or rhonchi; no distress Abdomen: Soft, non-tender, non-distended, bowel sounds positive. Extremities: No clubbing, cyanosis, or edema. Pulses are 2+ Psych: Pt's affect is a little flat. Pt is cooperative Skin: Clean and intact without signs of breakdown Neuro: Alert and oriented x 3, she is able to provide a clear and coherent history, follows commands, cranial nerves II through XII intact besides shoulder shrug is a little decreased on the right, able to name and repeat, no dysarthria or aphasia noted RUE: 4-/5 Deltoid, 4-/5 Biceps, 4-/5 Triceps,  4-/5 Grip LUE: 5/5 Deltoid, 5/5 Biceps, 5/5 Triceps, 5/5 Wrist Ext, 5/5 Grip RLE: HF 4-/5, KE 4-/5, ADF 4-/5, APF 4-/5 LLE: HF 5/5, KE 5/5, ADF 5/5, APF 5/5 Sensory exam normal for light touch and pain in all 4 limbs.  No abnormal tone appreciated.  Finger-nose intact on the left, more difficulty on the right due to weakness Musculoskeletal: No joint swelling or tenderness noted Normal muscle bulk  Results for orders placed or performed during the hospital encounter of 12/19/22 (from the past 48 hour(s))  Glucose, capillary     Status: Abnormal   Collection Time: 12/22/22 11:37 AM  Result Value Ref Range   Glucose-Capillary 220 (H) 70 - 99 mg/dL    Comment: Glucose reference range applies only to samples taken after fasting for at least 8 hours.  Glucose, capillary  Status: Abnormal   Collection Time: 12/22/22  5:08 PM  Result Value Ref Range   Glucose-Capillary 190 (H) 70 - 99 mg/dL    Comment: Glucose reference range applies only to samples taken after fasting for at least 8 hours.  Glucose, capillary     Status: Abnormal   Collection Time: 12/22/22  9:13 PM  Result Value Ref Range   Glucose-Capillary 222 (H) 70 - 99 mg/dL    Comment: Glucose reference range applies only to samples taken after fasting for at least 8 hours.   Comment 1 Notify RN    Basic metabolic panel     Status: Abnormal   Collection Time: 12/23/22  5:13 AM  Result Value Ref Range   Sodium 134 (L) 135 - 145 mmol/L   Potassium 3.9 3.5 - 5.1 mmol/L   Chloride 105 98 - 111 mmol/L   CO2 22 22 - 32 mmol/L   Glucose, Bld 171 (H) 70 - 99 mg/dL    Comment: Glucose reference range applies only to samples taken after fasting for at least 8 hours.   BUN 9 6 - 20 mg/dL   Creatinine, Ser 1.61 0.44 - 1.00 mg/dL   Calcium 9.0 8.9 - 09.6 mg/dL   GFR, Estimated >04 >54 mL/min    Comment: (NOTE) Calculated using the CKD-EPI Creatinine Equation (2021)    Anion gap 7 5 - 15    Comment: Performed at [facility], [address], [place], [place] [address]  CBC     Status: Abnormal   Collection Time: 12/23/22  5:13 AM  Result Value Ref Range   WBC 11.9 (H) 4.0 - 10.5 K/uL   RBC 4.27 3.87 - 5.11 MIL/uL   Hemoglobin 12.1 12.0 - 15.0 g/dL   HCT 91.4 78.2 - 95.6 %   MCV 88.3 80.0 - 100.0 fL   MCH 28.3 26.0 - 34.0 pg   MCHC 32.1 30.0 - 36.0 g/dL   RDW 21.3 08.6 - 57.8 %   Platelets 307 150 - 400 K/uL   nRBC 0.0 0.0 - 0.2 %    Comment: Performed at [facility], [address], [place], [place] [address]  Magnesium     Status: None   Collection Time: 12/23/22  5:13 AM  Result Value Ref Range   Magnesium 1.8 1.7 - 2.4 mg/dL    Comment: Performed at [facility], [address] [place], [place] [address]  Glucose, capillary     Status: Abnormal   Collection Time: 12/23/22  6:18 AM  Result Value Ref Range   Glucose-Capillary 191 (H) 70 - 99 mg/dL    Comment: Glucose reference range applies only to samples taken after fasting for at least 8 hours.   Comment 1 Notify RN   Glucose, capillary     Status: Abnormal   Collection Time: 12/23/22 12:38 PM  Result Value Ref Range   Glucose-Capillary 175 (H) 70 - 99 mg/dL    Comment: Glucose reference range applies only to samples taken after fasting for at least 8 hours.   Comment 1 Notify RN    Comment 2  Document in Chart   Glucose, capillary     Status: Abnormal   Collection Time: 12/23/22  4:25 PM  Result Value Ref Range   Glucose-Capillary 182 (H) 70 - 99 mg/dL    Comment: Glucose reference range applies only to samples taken after fasting for at least 8 hours.  Glucose, capillary  Status: Abnormal   Collection Time: 12/23/22  9:34 PM  Result Value Ref Range   Glucose-Capillary 224 (H) 70 - 99 mg/dL    Comment: Glucose reference range applies only to samples taken after fasting for at least 8 hours.  Glucose, capillary     Status: Abnormal   Collection Time: 12/24/22  6:18 AM  Result Value Ref Range   Glucose-Capillary 221 (H) 70 - 99 mg/dL    Comment: Glucose reference range applies only to samples taken after fasting for at least 8 hours.   Comment 1 Notify RN    Comment 2 Document in Chart    No results found.    Blood pressure 111/70, pulse 87, temperature 98.2 F (36.8 C), temperature source Oral, resp. rate 18, height 5\' 2"  (1.575 m), weight 97.7 kg, SpO2 98%.  Medical Problem List and Plan: 1. 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  Antithrombotics: -DVT/anticoagulation:  Pharmaceutical: Lovenox added  -antiplatelet therapy: DAPT X 3 weeks followed by ASA alone 3. Pain Management:  N/A 4. Mood/Behavior/Sleep: LCSW to follow for evaluation and support  -antipsychotic agents: N/A 5. Neuropsych/cognition: This patient is capable of making decisions on her own behalf. 6. Skin/Wound Care: Routine pressure relief measures.  7. Fluids/Electrolytes/Nutrition: Monitor I/O. Check CMET in am.  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 On Tresiba 16 units w/ Novolog 20 units TID ac/ Endo @ [facility]  --Continue Insulin gargline 30 units with 15 units TID ac --Monitor BS ac/hs and continue to educate on compliance, risk  factors/benefits. She didn't take insulin consistently and does not wear CGM due to "excessive beeping" 9. HTN: Monitor BP TID- continue to hold Lisinopril and hydrochlorothiazide.  Long-term goal normotensive --BP controlled currently  10. Hyponatremia: Question chronic @131 -132 range per chart review  --recheck BMET in am  11. Hyperlipidemia: Trig-245/LDL-180. Now on increased dose Crestor 40 mg.  Dietary education 12. Hypomagnesemia: Recheck in am. Now on Mag ox daily.  13. Chronic leucocytosis: Trend WBC and monitor for signs of infection.   --improved from 13.4 to 11.9 14. Morbid obesity: BMI- 39.4. CM/HH diet. RD consulted to review diet.  --Educate patient on importance of weight loss and exercise to help promote health, improve AIC and mobility.  15. Constipation: Declines need for laxative. Advised that prns on board. Monitor for now.     Jacquelynn Cree, [name] 12/24/2022  I have personally performed a face to face diagnostic evaluation of this patient and formulated the key components of the plan.  Additionally, I have personally reviewed laboratory data, imaging studies, as well as relevant notes and concur with the physician assistant's documentation above.  The patient's status has not changed from the original H&P.  Any changes in documentation from the acute care chart have been noted above.  Fanny Dance, MD, [place]

## 2022-12-24 NOTE — Progress Notes (Signed)
Inpatient Rehab Admissions Coordinator:    I have insurance approval and a bed available for pt to admit to CIR today. Dr. Tyson Babinski in agreement.  Will let pt/family and TOC team know.   Estill Dooms, PT, DPT Admissions Coordinator [phone] 12/24/22  10:49 AM

## 2022-12-24 NOTE — Progress Notes (Signed)
INPATIENT REHABILITATION ADMISSION NOTE   Arrival Method: Via wheelchair at 1500 from 3W     Mental Orientation: A/O x4   Assessment: Completed    Skin: intact , With Angelina RN    IV'S: None   Pain: Zero   Tubes and Drains: None   Safety Measures: Three bed rails up , call bell and phone within reach.    Vital Signs: Completed    Height and Weight: completed    Rehab Orientation: Informed patient , No questions at this time.    Family: Notified by patient    Pt had no other concerns at this time , resting in bed.   Tori Milks LPN

## 2022-12-24 NOTE — Discharge Summary (Signed)
Physician Discharge Summary  Lisa Werner ZOX:[id number] DOB: 02/13/1981 DOA: 12/19/2022  PCP: Julieanne Manson, MD  Admit date: 12/19/2022 Discharge date: 12/24/2022  Admitted From: Home  Discharge disposition: CIR   Recommendations for Outpatient Follow-Up:   Follow-up with [facility] neurology Associates as outpatient Follow factor V Leiden and prothrombin gene mutation lab work.   Discharge Diagnosis:   Principal Problem:   Acute ischemic stroke [facility]) Active Problems:   DM2 (diabetes mellitus, type 2) (HCC)   Mixed hyperlipidemia   Essential hypertension   Leukocytosis   Acute right-sided weakness   CVA (cerebral vascular accident) [facility])   Discharge Condition: Improved.  Diet recommendation: .  Carbohydrate-modified.   Wound care: None.  Code status: Full.   History of Present Illness:   42 year old female with past medical history of hypertension, poorly controlled diabetes, hyperlipidemia presented to hospital with 4-day history of right-sided weakness and hyperglycemia.  MRI was done which showed left pontine stroke.  Patient was then admitted to the hospital for further evaluation and treatment.  Patient was seen by neurology and at this time patient is awaiting for CIR placement.    Hospital Course:   Following conditions were addressed during hospitalization as listed below,  Subacute left pontine stroke Neurology followed the patient during hospitalization.  Continue aspirin Plavix and statins.  2D echocardiogram with bubble study shows normal echocardiogram without any interatrial shunt.  Hemoglobin A1c of more than 15.5.  Hypercoagulable workup with Antithrombin III within normal range, protein C, S activity within range, protein S within normal range, lupus anticoagulant and beta-2 glycoprotein within normal range.  Cardiolipin antibodies within normal range.  Homocystine within normal range.  Factor V Leiden in process including prothrombin  gene mutation.  MRI with acute pontine infarct, MRI without large vessel stenosis, PT OT has recommend CIR and has been accepted for CIR today.Marland Kitchen   DM2 with hyperglycemia. Hemoglobin A1c more than 15.5.  Marland Kitchen  Continue sliding scale insulin Accu-Cheks diabetic diet.  Will need compliance with insulin on discharge.  Latest POC glucose of 221.  Received glargine during hospitalization.  Patient is on [place] at home and will increase to 30 units.   Hypomagnesemia.  Improved after replacement.  Latest magnesium 1.8.   HTN on HCTZ and lisinopril at home.  Will discontinue HCTZ on discharge.  On lisinopril 10 mg at home.  Can increase if needed  Dyslipidemia Continue Crestor.   Leukocytosis Chronic for 5 years.  Latest WBC at 11.9 from 12.1.  Afebrile.  No signs of infection.  Obesity.Body mass index is 39.4 kg/m.  Would benefit from ongoing weight loss as outpatient.  Disposition.  At this time, patient is stable for disposition to CIR.  Medical Consultants:   None.  Procedures:    None Subjective:   Today, patient patient was seen and examined at bedside.  Complains of mild weakness.  No headache nausea vomiting.  Wants to eat breakfast.   Discharge Exam:   Vitals:   12/24/22 0400 12/24/22 0741  BP: 120/68 111/70  Pulse: 82 87  Resp: 18 18  Temp: 98.1 F (36.7 C) 98.2 F (36.8 C)  SpO2: 100% 98%   Vitals:   12/23/22 2000 12/24/22 0000 12/24/22 0400 12/24/22 0741  BP: 136/86 130/88 120/68 111/70  Pulse: 92 90 82 87  Resp: 18 18 18 18   Temp: 98.7 F (37.1 C) 98.5 F (36.9 C) 98.1 F (36.7 C) 98.2 F (36.8 C)  TempSrc: Oral Oral Oral Oral  SpO2: 96%  98% 100% 98%  Weight:      Height:       Body mass index is 39.4 kg/m.    General: Obese built, not in obvious distress, alert awake and Communicative, HENT:   No scleral pallor or icterus noted. Oral mucosa is moist.  Chest:  Clear breath sounds.  No crackles or wheezes.  CVS: S1 &S2 heard. No murmur.  Regular rate  and rhythm. Abdomen: Soft, nontender, nondistended.  Bowel sounds are heard.   Extremities: No cyanosis, clubbing or edema.  Peripheral pulses are palpable. Psych: Alert, awake oriented, flat affect, slow to respond CNS:  No cranial nerve deficits.  Mild right-sided weakness noted. Skin: Warm and dry.  No rashes noted.  The results of significant diagnostics from this hospitalization (including imaging, microbiology, ancillary and laboratory) are listed below for reference.     Diagnostic Studies:   ECHOCARDIOGRAM COMPLETE BUBBLE STUDY  Result Date: 12/20/2022    ECHOCARDIOGRAM REPORT   Patient Name:   Lisa Werner [facility] Date of Exam: 12/20/2022 Medical Rec #:  [id number]               Height:       62.0 in Accession #:    [id number]              Weight:       200.0 lb Date of Birth:  02/09/1981              BSA:          1.912 m Patient Age:    41 years                BP:           110/76 mmHg Patient Gender: F                       HR:           88 bpm. Exam Location:  Inpatient Procedure: 2D Echo, Cardiac Doppler, Color Doppler and Strain Analysis Indications:    Stroke  History:        Patient has no prior history of Echocardiogram examinations.                 Risk Factors:Hypertension, Diabetes and Dyslipidemia.  Sonographer:    Sheralyn Boatman RDCS Referring Phys: [id number] Angie Fava  Sonographer Comments: Patient is obese. Image acquisition challenging due to patient body habitus. IMPRESSIONS  1. Left ventricular ejection fraction, by estimation, is 65 to 70%. The left ventricle has normal function. The left ventricle has no regional wall motion abnormalities. There is moderate left ventricular hypertrophy. Left ventricular diastolic parameters were normal. The average left ventricular global longitudinal strain is -22.0 %. The global longitudinal strain is normal.  2. Right ventricular systolic function is normal. The right ventricular size is normal.  3. The mitral valve is normal in  structure. Trivial mitral valve regurgitation. No evidence of mitral stenosis.  4. The aortic valve is tricuspid. Aortic valve regurgitation is not visualized. No aortic stenosis is present.  5. Agitated saline contrast bubble study was negative, with no evidence of any interatrial shunt. Comparison(s): No prior Echocardiogram. Conclusion(s)/Recommendation(s): Otherwise normal echocardiogram, with minor abnormalities described in the report. FINDINGS  Left Ventricle: Left ventricular ejection fraction, by estimation, is 65 to 70%. The left ventricle has normal function. The left ventricle has no regional wall motion abnormalities. The average left ventricular global longitudinal strain is -22.0 %. The  global longitudinal strain is normal. The left ventricular internal cavity size was small. There is moderate left ventricular hypertrophy. Left ventricular diastolic parameters were normal. Right Ventricle: The right ventricular size is normal. No increase in right ventricular wall thickness. Right ventricular systolic function is normal. Left Atrium: Left atrial size was normal in size. Right Atrium: Right atrial size was normal in size. Pericardium: There is no evidence of pericardial effusion. Mitral Valve: The mitral valve is normal in structure. Trivial mitral valve regurgitation. No evidence of mitral valve stenosis. Tricuspid Valve: The tricuspid valve is normal in structure. Tricuspid valve regurgitation is trivial. No evidence of tricuspid stenosis. Aortic Valve: The aortic valve is tricuspid. Aortic valve regurgitation is not visualized. No aortic stenosis is present. Pulmonic Valve: The pulmonic valve was grossly normal. Pulmonic valve regurgitation is not visualized. No evidence of pulmonic stenosis. Aorta: The aortic root, ascending aorta, aortic arch and descending aorta are all structurally normal, with no evidence of dilitation or obstruction. Venous: The inferior vena cava was not well visualized.  IAS/Shunts: No atrial level shunt detected by color flow Doppler. Agitated saline contrast was given intravenously to evaluate for intracardiac shunting. Agitated saline contrast bubble study was negative, with no evidence of any interatrial shunt.  LEFT VENTRICLE PLAX 2D LVIDd:         3.35 cm     Diastology LVIDs:         2.10 cm     LV e' medial:    5.55 cm/s LV PW:         1.55 cm     LV E/e' medial:  12.4 LV IVS:        1.55 cm     LV e' lateral:   5.87 cm/s LVOT diam:     2.30 cm     LV E/e' lateral: 11.7 LV SV:         68 LV SV Index:   35          2D Longitudinal Strain LVOT Area:     4.15 cm    2D Strain GLS (A2C):   -24.5 %                            2D Strain GLS (A3C):   -17.6 %                            2D Strain GLS (A4C):   -23.9 % LV Volumes (MOD)           2D Strain GLS Avg:     -22.0 % LV vol d, MOD A2C: 58.1 ml LV vol d, MOD A4C: 60.2 ml LV vol s, MOD A2C: 16.8 ml LV vol s, MOD A4C: 16.7 ml LV SV MOD A2C:     41.3 ml LV SV MOD A4C:     60.2 ml LV SV MOD BP:      42.3 ml RIGHT VENTRICLE            IVC RV S prime:     8.92 cm/s  IVC diam: 2.00 cm TAPSE (M-mode): 1.5 cm LEFT ATRIUM             Index        RIGHT ATRIUM           Index LA diam:        3.50 cm 1.83 cm/m   RA  Area:     11.50 cm LA Vol (A2C):   28.3 ml 14.80 ml/m  RA Volume:   22.80 ml  11.93 ml/m LA Vol (A4C):   32.2 ml 16.84 ml/m LA Biplane Vol: 30.3 ml 15.85 ml/m  AORTIC VALVE LVOT Vmax:   88.10 cm/s LVOT Vmean:  60.200 cm/s LVOT VTI:    0.163 m  AORTA Ao Root diam: 3.00 cm Ao Asc diam:  2.80 cm MITRAL VALVE MV Area (PHT): 4.54 cm    SHUNTS MV Decel Time: 167 msec    Systemic VTI:  0.16 m MV E velocity: 68.55 cm/s  Systemic Diam: 2.30 cm MV A velocity: 61.70 cm/s MV E/A ratio:  1.11 Jodelle Red MD Electronically signed by Jodelle Red MD Signature Date/Time: 12/20/2022/12:03:25 PM    Final    CT ANGIO HEAD NECK W WO CM  Result Date: 12/19/2022 CLINICAL DATA:  Stroke on MRI, determine embolic source EXAM:  CT ANGIOGRAPHY HEAD AND NECK WITH AND WITHOUT CONTRAST TECHNIQUE: Multidetector CT imaging of the head and neck was performed using the standard protocol during bolus administration of intravenous contrast. Multiplanar CT image reconstructions and MIPs were obtained to evaluate the vascular anatomy. Carotid stenosis measurements (when applicable) are obtained utilizing NASCET criteria, using the distal internal carotid diameter as the denominator. RADIATION DOSE REDUCTION: This exam was performed according to the departmental dose-optimization program which includes automated exposure control, adjustment of the mA and/or kV according to patient size and/or use of iterative reconstruction technique. CONTRAST:  75mL OMNIPAQUE IOHEXOL 350 MG/ML SOLN COMPARISON:  12/19/2022 CT head, MRI head, and MRA head, no prior CTA FINDINGS: CT HEAD FINDINGS For noncontrast findings, please see same day CT head. CTA NECK FINDINGS Aortic arch: Two-vessel arch with a common origin of the brachiocephalic and left common carotid arteries. Imaged portion shows no evidence of aneurysm or dissection. No significant stenosis of the major arch vessel origins. Right carotid system: No evidence of dissection, occlusion, or hemodynamically significant stenosis (greater than 50%). Atherosclerotic disease at the bifurcation and in the proximal ICA is not hemodynamically significant. Left carotid system: No evidence of dissection, occlusion, or hemodynamically significant stenosis (greater than 50%). Atherosclerotic disease at the bifurcation and in the proximal ICA is not hemodynamically significant. Vertebral arteries: Right dominant system. The left vertebral artery is patent to the skull base, with areas of caliber change in the proximal left V2 segment (series 7, image 241 an series 9, image 109 and 113), concerning for dissection. The right vertebral artery is dominant and patent to the skull base without evidence of dissection or  significant stenosis. Skeleton: No acute osseous abnormality. Degenerative changes in the cervical spine. Other neck: No acute finding. Upper chest: No focal pulmonary opacity or pleural effusion. Review of the MIP images confirms the above findings CTA HEAD FINDINGS Anterior circulation: Both internal carotid arteries are patent to the termini, with mild stenosis in the distal right supraclinoid ICA (series 7, image 107 A1 segments patent. Normal anterior communicating artery. Anterior cerebral arteries are patent to their distal aspects without significant stenosis. No M1 stenosis or occlusion. MCA branches perfused to their distal aspects without significant stenosis. Posterior circulation: Vertebral arteries patent to the vertebrobasilar junction without significant stenosis. Posterior inferior cerebellar arteries patent proximally. Basilar patent to its distal aspect without significant stenosis. Superior cerebellar arteries patent proximally. Patent P1 segments. PCAs perfused to their distal aspects without significant stenosis. The right posterior communicating artery is patent. Venous sinuses: As permitted by contrast timing,  patent. Anatomic variants: None significant. Review of the MIP images confirms the above findings IMPRESSION: 1. Areas of caliber change in the proximal left V2 segment, concerning for dissection. 2. No intracranial large vessel occlusion. Mild stenosis in the distal right supraclinoid ICA. 3. No hemodynamically significant stenosis in the neck. Imaging results were communicated on 12/19/2022 at 11:47 pm to provider Dr. Wilford Corner via secure text paging. Electronically Signed   By: Wiliam Ke M.D.   On: 12/19/2022 23:48   MR BRAIN WO CONTRAST  Result Date: 12/19/2022 CLINICAL DATA:  Stroke suspected EXAM: MRI HEAD WITHOUT CONTRAST MRA HEAD WITHOUT CONTRAST TECHNIQUE: Multiplanar, multi-echo pulse sequences of the brain and surrounding structures were acquired without intravenous  contrast. Angiographic images of the Circle of Willis were acquired using MRA technique without intravenous contrast. COMPARISON:  No prior MRI available, correlation is made with CT head 12/19/2022 FINDINGS: MRI HEAD FINDINGS Brain: Restricted diffusion with ADC correlate in the left pons (series 5, images 73-76), which measures up to 1.5 x 1.2 x 0.9 cm. This area is associated with increased T2 hyperintense signal, likely cytotoxic edema. No acute hemorrhage, mass, mass effect, or midline shift. No hydrocephalus or extra-axial collection. Normal pituitary and craniocervical junction. Hemosiderin deposition in the right thalamus and cerebral peduncle, likely sequela of prior hypertensive microhemorrhage. Vascular: Please see MRA findings below. Skull and upper cervical spine: Normal marrow signal. Sinuses/Orbits: Mucosal thickening in the ethmoid air cells. No acute finding in the orbits. Other: The mastoid air cells are well aerated. MRA HEAD FINDINGS Anterior circulation: Both internal carotid arteries are patent to the termini, without significant stenosis. A1 segments patent. Normal anterior communicating artery. Anterior cerebral arteries are patent to their distal aspects without significant stenosis. 1-2 mm laterally directed outpouching from the proximal right A2 (series 9, image 111), which may represent a tiny aneurysm versus an infundibulum. No M1 stenosis or occlusion. Distal MCA branches perfused to their distal aspects without significant stenosis. Posterior circulation: Right dominant system, with relatively large right vertebral and basilar arteries, which are otherwise unremarkable; no evidence of aneurysmal dilatation or significant tortuosity. Vertebral arteries patent to the vertebrobasilar junction without stenosis. Basilar patent to its distal aspect. Superior cerebellar arteries patent proximally. Patent P1 segments. PCAs perfused to their distal aspects without significant stenosis. The  right posterior communicating artery is patent. Anatomic variants: None significant IMPRESSION: 1. Acute infarct in the left pons. 2. No intracranial large vessel occlusion or significant stenosis. 3. 1-2 mm laterally directed outpouching from the proximal right A2, which may represent a tiny aneurysm versus an infundibulum. These results were called by telephone at the time of interpretation on 12/19/2022 at 8:25 pm to provider [facility], who verbally acknowledged these results. Electronically Signed   By: Wiliam Ke M.D.   On: 12/19/2022 20:25   MR ANGIO HEAD WO CONTRAST  Result Date: 12/19/2022 CLINICAL DATA:  Stroke suspected EXAM: MRI HEAD WITHOUT CONTRAST MRA HEAD WITHOUT CONTRAST TECHNIQUE: Multiplanar, multi-echo pulse sequences of the brain and surrounding structures were acquired without intravenous contrast. Angiographic images of the Circle of Willis were acquired using MRA technique without intravenous contrast. COMPARISON:  No prior MRI available, correlation is made with CT head 12/19/2022 FINDINGS: MRI HEAD FINDINGS Brain: Restricted diffusion with ADC correlate in the left pons (series 5, images 73-76), which measures up to 1.5 x 1.2 x 0.9 cm. This area is associated with increased T2 hyperintense signal, likely cytotoxic edema. No acute hemorrhage, mass, mass effect, or midline shift. No hydrocephalus  or extra-axial collection. Normal pituitary and craniocervical junction. Hemosiderin deposition in the right thalamus and cerebral peduncle, likely sequela of prior hypertensive microhemorrhage. Vascular: Please see MRA findings below. Skull and upper cervical spine: Normal marrow signal. Sinuses/Orbits: Mucosal thickening in the ethmoid air cells. No acute finding in the orbits. Other: The mastoid air cells are well aerated. MRA HEAD FINDINGS Anterior circulation: Both internal carotid arteries are patent to the termini, without significant stenosis. A1 segments patent. Normal anterior  communicating artery. Anterior cerebral arteries are patent to their distal aspects without significant stenosis. 1-2 mm laterally directed outpouching from the proximal right A2 (series 9, image 111), which may represent a tiny aneurysm versus an infundibulum. No M1 stenosis or occlusion. Distal MCA branches perfused to their distal aspects without significant stenosis. Posterior circulation: Right dominant system, with relatively large right vertebral and basilar arteries, which are otherwise unremarkable; no evidence of aneurysmal dilatation or significant tortuosity. Vertebral arteries patent to the vertebrobasilar junction without stenosis. Basilar patent to its distal aspect. Superior cerebellar arteries patent proximally. Patent P1 segments. PCAs perfused to their distal aspects without significant stenosis. The right posterior communicating artery is patent. Anatomic variants: None significant IMPRESSION: 1. Acute infarct in the left pons. 2. No intracranial large vessel occlusion or significant stenosis. 3. 1-2 mm laterally directed outpouching from the proximal right A2, which may represent a tiny aneurysm versus an infundibulum. These results were called by telephone at the time of interpretation on 12/19/2022 at 8:25 pm to provider [facility], who verbally acknowledged these results. Electronically Signed   By: Wiliam Ke M.D.   On: 12/19/2022 20:25   CT Head Wo Contrast  Result Date: 12/19/2022 CLINICAL DATA:  Hyperglycemia and weakness. EXAM: CT HEAD WITHOUT CONTRAST TECHNIQUE: Contiguous axial images were obtained from the base of the skull through the vertex without intravenous contrast. RADIATION DOSE REDUCTION: This exam was performed according to the departmental dose-optimization program which includes automated exposure control, adjustment of the mA and/or kV according to patient size and/or use of iterative reconstruction technique. COMPARISON:  None Available. FINDINGS: Brain: No evidence  of acute infarction, hemorrhage, hydrocephalus, extra-axial collection or mass lesion/mass effect. Vascular: The basilar artery measures approximately 6 mm in diameter and is mildly calcified and tortuous in appearance. Skull: Normal. Negative for fracture or focal lesion. Sinuses/Orbits: No acute finding. Other: None. IMPRESSION: 1. No acute intracranial abnormality. 2. 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 Electronically Signed   By: Aram Candela M.D.   On: 12/19/2022 15:15     Labs:   Basic Metabolic Panel: Recent Labs  Lab 12/19/22 1136 12/19/22 1258 12/20/22 0133 12/22/22 0540 12/23/22 0513  NA 131* 130* 133* 133* 134*  K 4.4 4.3 3.5 3.9 3.9  CL 95*  --  101 103 105  CO2 20*  --  21* 20* 22  GLUCOSE 402*  --  279* 254* 171*  BUN 9  --  7 10 9   CREATININE 1.00  --  0.80 0.98 0.84  CALCIUM 9.2  --  8.8* 8.3* 9.0  MG 1.7  --  1.9 1.6* 1.8   GFR Estimated Creatinine Clearance: 96.1 mL/min (by C-G formula based on SCr of 0.84 mg/dL). Liver Function Tests: Recent Labs  Lab 12/19/22 1136 12/20/22 0133  AST 22 24  ALT 23 21  ALKPHOS 94 87  BILITOT 0.8 0.7  PROT 7.1 7.1  ALBUMIN 3.0* 2.8*   No results for input(s): "LIPASE", "AMYLASE" in the last 168 hours. No  results for input(s): "AMMONIA" in the last 168 hours. Coagulation profile No results for input(s): "INR", "PROTIME" in the last 168 hours.  CBC: Recent Labs  Lab 12/19/22 1136 12/19/22 1258 12/20/22 0133 12/22/22 0540 12/23/22 0513  WBC 13.4*  --  11.6* 12.1* 11.9*  NEUTROABS 11.0*  --  7.6  --   --   HGB 13.0 13.6 13.2 11.7* 12.1  HCT 40.3 40.0 41.8 37.2 37.7  MCV 90.8  --  89.5 90.5 88.3  PLT 292  --  295 265 307   Cardiac Enzymes: No results for input(s): "CKTOTAL", "CKMB", "CKMBINDEX", "TROPONINI" in the last 168 hours. BNP: Invalid input(s): "POCBNP" CBG: Recent Labs  Lab 12/23/22 0618 12/23/22 1238 12/23/22 1625 12/23/22 2134 12/24/22 0618   GLUCAP 191* 175* 182* 224* 221*   D-Dimer No results for input(s): "DDIMER" in the last 72 hours. Hgb A1c No results for input(s): "HGBA1C" in the last 72 hours. Lipid Profile No results for input(s): "CHOL", "HDL", "LDLCALC", "TRIG", "CHOLHDL", "LDLDIRECT" in the last 72 hours. Thyroid function studies No results for input(s): "TSH", "T4TOTAL", "T3FREE", "THYROIDAB" in the last 72 hours.  Invalid input(s): "FREET3" Anemia work up No results for input(s): "VITAMINB12", "FOLATE", "FERRITIN", "TIBC", "IRON", "RETICCTPCT" in the last 72 hours. Microbiology No results found for this or any previous visit (from the past 240 hour(s)).   Discharge Instructions:   Discharge Instructions     Ambulatory referral to Neurology   Complete by: As directed    Follow up with stroke clinic NP (Jessica Vanschaick or Darrol Angel, if both not available, consider Manson Allan, or [facility]hern) at [facility] in about 4 weeks. Thanks.   Diet Carb Modified   Complete by: As directed    Discharge instructions   Complete by: As directed    Follow-up with [facility] as outpatient as scheduled by the clinic.  Continue rehabilitation.  Follow-up with your primary care provider after discharge from rehabilitation.   Increase activity slowly   Complete by: As directed       Allergies as of 12/24/2022       Reactions   Pollen Extract         Medication List     STOP taking these medications    fluconazole 150 MG tablet Commonly known as: DIFLUCAN   hydrochlorothiazide 12.5 MG tablet Commonly known as: HYDRODIURIL   Lantus SoloStar 100 UNIT/ML Solostar Pen Generic drug: insulin glargine   pravastatin 20 MG tablet Commonly known as: Pravachol       TAKE these medications    acetaminophen 325 MG tablet Commonly known as: TYLENOL Take 2 tablets (650 mg total) by mouth every 6 (six) hours as needed for mild pain, moderate pain or fever (or Fever >/= 101).   AgaMatrix  Presto Test test strip Generic drug: glucose blood Check blood glucose 3 times daily before meals   AgaMatrix Presto w/Device Kit Check blood glucose 3 times daily before meals   AgaMatrix Ultra-Thin Lancets Misc Check blood glucose before meals 3 times daily.   albuterol 108 (90 Base) MCG/ACT inhaler Commonly known as: VENTOLIN HFA Inhale 2 puffs into the lungs every 6 (six) hours as needed for wheezing.   aspirin EC 81 MG tablet Take 1 tablet (81 mg total) by mouth daily. Swallow whole. Start taking on: December 25, 2022   clopidogrel 75 MG tablet Commonly known as: PLAVIX Take 1 tablet (75 mg total) by mouth daily for 18 days. Start taking on: December 25, 2022  INSULIN SYRINGE 1CC/30GX1/2" 30G X 1/2" 1 ML Misc 1 Units by Does not apply route 4 (four) times daily -  before meals and at bedtime.   lisinopril 10 MG tablet Commonly known as: ZESTRIL Take 1 tablet (10 mg total) by mouth daily. What changed:  how much to take how to take this when to take this additional instructions   magnesium oxide 400 (240 Mg) MG tablet Commonly known as: MAG-OX Take 1 tablet (400 mg total) by mouth daily.   melatonin 3 MG Tabs tablet Take 1 tablet (3 mg total) by mouth at bedtime as needed (insomnia).   NovoLOG FlexPen 100 UNIT/ML FlexPen Generic drug: insulin aspart Inject 4 units before meals 3 times daily if blood glucose is greater than 300 What changed:  how much to take how to take this when to take this additional instructions   rosuvastatin 40 MG tablet Commonly known as: CRESTOR Take 1 tablet (40 mg total) by mouth at bedtime. What changed:  medication strength how much to take   Tresiba FlexTouch 100 UNIT/ML FlexTouch Pen Generic drug: insulin degludec Inject 30 Units into the skin at bedtime. What changed: how much to take        Follow-up Information     [facility]. Schedule an appointment as soon as possible for a visit  in 1 month(s).   Specialty: Neurology Why: stroke clinic Contact information: [address] [place] [address] [phone]                 Time coordinating discharge: 39 minutes  Signed:  Orin Eberwein  [facility] 12/24/2022, 10:59 AM

## 2022-12-24 NOTE — Plan of Care (Signed)

## 2022-12-25 ENCOUNTER — Inpatient Hospital Stay (HOSPITAL_COMMUNITY): Payer: BC Managed Care – PPO

## 2022-12-25 DIAGNOSIS — I639 Cerebral infarction, unspecified: Secondary | ICD-10-CM | POA: Diagnosis not present

## 2022-12-25 LAB — URINALYSIS, ROUTINE W REFLEX MICROSCOPIC
Bilirubin Urine: NEGATIVE
Glucose, UA: >=500 mg/dL — AB
Ketones, ur: NEGATIVE mg/dL
Leukocytes,Ua: NEGATIVE
Nitrite: NEGATIVE
Protein, ur: 30 mg/dL — AB
RBC / HPF: >50 RBC/hpf (ref 0–5)
Specific Gravity, Urine: 1.023 (ref 1.005–1.030)
pH: 5 (ref 5.0–8.0)

## 2022-12-25 LAB — COMPREHENSIVE METABOLIC PANEL
ALT: 21 U/L (ref 0–44)
AST: 24 U/L (ref 15–41)
Albumin: 2.8 g/dL — ABNORMAL LOW (ref 3.5–5.0)
Alkaline Phosphatase: 76 U/L (ref 38–126)
Anion gap: 10 (ref 5–15)
BUN: 12 mg/dL (ref 6–20)
CO2: 22 mmol/L (ref 22–32)
Calcium: 9.1 mg/dL (ref 8.9–10.3)
Chloride: 103 mmol/L (ref 98–111)
Creatinine, Ser: 0.93 mg/dL (ref 0.44–1.00)
GFR, Estimated: >60 mL/min (ref >60)
Glucose, Bld: 175 mg/dL — ABNORMAL HIGH (ref 70–99)
Potassium: 4.6 mmol/L (ref 3.5–5.1)
Sodium: 135 mmol/L (ref 135–145)
Total Bilirubin: 0.5 mg/dL (ref 0.3–1.2)
Total Protein: 7.1 g/dL (ref 6.5–8.1)

## 2022-12-25 LAB — CBC WITH DIFFERENTIAL/PLATELET
Abs Immature Granulocytes: 0.08 10*3/uL — ABNORMAL HIGH (ref 0.00–0.07)
Basophils Absolute: 0.1 10*3/uL (ref 0.0–0.1)
Basophils Relative: 1 %
Eosinophils Absolute: 0.6 10*3/uL — ABNORMAL HIGH (ref 0.0–0.5)
Eosinophils Relative: 4 %
HCT: 39.3 % (ref 36.0–46.0)
Hemoglobin: 12.6 g/dL (ref 12.0–15.0)
Immature Granulocytes: 1 %
Lymphocytes Relative: 13 %
Lymphs Abs: 2.1 10*3/uL (ref 0.7–4.0)
MCH: 28.7 pg (ref 26.0–34.0)
MCHC: 32.1 g/dL (ref 30.0–36.0)
MCV: 89.5 fL (ref 80.0–100.0)
Monocytes Absolute: 0.8 10*3/uL (ref 0.1–1.0)
Monocytes Relative: 5 %
Neutro Abs: 12.5 10*3/uL — ABNORMAL HIGH (ref 1.7–7.7)
Neutrophils Relative %: 76 %
Platelets: 256 10*3/uL (ref 150–400)
RBC: 4.39 MIL/uL (ref 3.87–5.11)
RDW: 13.4 % (ref 11.5–15.5)
WBC: 16.1 10*3/uL — ABNORMAL HIGH (ref 4.0–10.5)
nRBC: 0 % (ref 0.0–0.2)

## 2022-12-25 LAB — MAGNESIUM: Magnesium: 1.9 mg/dL (ref 1.7–2.4)

## 2022-12-25 LAB — GLUCOSE, CAPILLARY
Glucose-Capillary: 212 mg/dL — ABNORMAL HIGH (ref 70–99)
Glucose-Capillary: 314 mg/dL — ABNORMAL HIGH (ref 70–99)
Glucose-Capillary: 276 mg/dL — ABNORMAL HIGH (ref 70–99)
Glucose-Capillary: 140 mg/dL — ABNORMAL HIGH (ref 70–99)

## 2022-12-25 MED ORDER — SENNOSIDES-DOCUSATE SODIUM 8.6-50 MG PO TABS
1.0000 | ORAL_TABLET | Freq: Every day | ORAL | Status: DC
  Administered 2022-12-25 – 2023-01-01 (×8): 1 via ORAL
  Filled 2022-12-25 (×8): qty 1

## 2022-12-25 MED ORDER — INSULIN GLARGINE-YFGN 100 UNIT/ML ~~LOC~~ SOLN
33.0000 [IU] | Freq: Every day | SUBCUTANEOUS | Status: DC
  Administered 2022-12-26: 33 [IU] via SUBCUTANEOUS
  Filled 2022-12-25 (×2): qty 0.33

## 2022-12-25 MED ORDER — DOCUSATE SODIUM 100 MG PO CAPS: 100.0000 mg | ORAL_CAPSULE | Freq: Two times a day (BID) | ORAL | Status: DC

## 2022-12-25 NOTE — Progress Notes (Signed)
Inpatient Rehabilitation  Patient information reviewed and entered into eRehab system by Melissa M. Bowie, M.A., CCC/SLP, PPS Coordinator.  Information including medical coding, functional ability and quality indicators will be reviewed and updated through discharge.    

## 2022-12-25 NOTE — Progress Notes (Signed)
Inpatient Rehabilitation Admission Medication Review by a Pharmacist  A complete drug regimen review was completed for this patient to identify any potential clinically significant medication issues.  High Risk Drug Classes Is patient taking? Indication by Medication  Antipsychotic Yes Compazine- N/V  Anticoagulant Yes Lovenox- vte ppx  Antibiotic No   Opioid No   Antiplatelet Yes Aspirin/plavix- cva ppx DAPT x3 weeks f/b asa alone (end date: 9/11)  Hypoglycemics/insulin Yes Insulin- T2DM  Vasoactive Medication No   Chemotherapy No   Other Yes Crestor- HLD Melatonin- sleep Trazodone- sleep Benadryl- itching     Type of Medication Issue Identified Description of Issue Recommendation(s)  Drug Interaction(s) (clinically significant)     Duplicate Therapy     Allergy     No Medication Administration End Date     Incorrect Dose     Additional Drug Therapy Needed     Significant med changes from prior encounter (inform family/care partners about these prior to discharge).    Other  PTA meds HCTZ Zestril Restart PTA meds when and if necessary during CIR admission or at time of discharge, if warranted    Clinically significant medication issues were identified that warrant physician communication and completion of prescribed/recommended actions by midnight of the next day:  No   Time spent performing this drug regimen review (minutes):  30   Earle Reome BS, PharmD, BCPS Clinical Pharmacist 12/24/2022 12:10 PM  Contact: [phone] after 3 PM  "Be curious, not judgmental..." -Debbora Dus

## 2022-12-25 NOTE — Progress Notes (Signed)
Patient ID: Lisa Werner, female   DOB: 01-07-1981, 42 y.o.   MRN: [id number]  SW met with pt in room to inform on limited updates from team conference due to her first day of evaluations. She is aware her ELOS is 14 days, and SW will follow-up with updates next week. She is aware SW will follow-up with her father to provide updates.   Cecile Sheerer, MSW, LCSWA Office: [phone] Cell: [phone] Fax: [phone]

## 2022-12-25 NOTE — Progress Notes (Signed)
PROGRESS NOTE   Subjective/Complaints:   Pt's nurse asked for colace- small BM this AM at 3am, but was soft blobs- asking for colace, but had soft blobs- will add Senna-S 1 tab at bedtime  Pt reports "no issues" and asked that I let her sleep longer. Hasn't eaten breakfast.   Said LBM last night Denies pain.   Per nursing, has some dysuria/urgency.     ROS: .limited by sleepiness- doesn't want to wake up to discuss issues with me.   Objective:   No results found. Recent Labs    12/23/22 0513 12/25/22 0754  WBC 11.9* 16.1*  HGB 12.1 12.6  HCT 37.7 39.3  PLT 307 256   Recent Labs    12/23/22 0513 12/25/22 0754  NA 134* 135  K 3.9 4.6  CL 105 103  CO2 22 22  GLUCOSE 171* 175*  BUN 9 12  CREATININE 0.84 0.93  CALCIUM 9.0 9.1    Intake/Output Summary (Last 24 hours) at 12/25/2022 0924 Last data filed at 12/24/2022 1811 Gross per 24 hour  Intake 720 ml  Output --  Net 720 ml        Physical Exam: Vital Signs Blood pressure 115/72, pulse 93, temperature 97.9 F (36.6 C), resp. rate 18, height 5\' 2"  (1.575 m), weight 93.1 kg, SpO2 97%.     General: asleep- didn't want to wake with verbal and light tactile stimuli- kept closing eyes;  NAD HENT:  oropharynx a little dry CV: regular rate and rhythm; no JVD Pulmonary: CTA B/L; no W/R/R- good air movement GI: soft, NT, ND, (+)BS- hypoactive Psychiatric: didn't want to wake up even with stimuli Neurological: very sleepy Neuro: Alert and oriented x 3, she is able to provide a clear and coherent history, follows commands, cranial nerves II through XII intact besides shoulder shrug is a little decreased on the right, able to name and repeat, no dysarthria or aphasia noted RUE: 4-/5 Deltoid, 4-/5 Biceps, 4-/5 Triceps,  4-/5 Grip LUE: 5/5 Deltoid, 5/5 Biceps, 5/5 Triceps, 5/5 Wrist Ext, 5/5 Grip RLE: HF 4-/5, KE 4-/5, ADF 4-/5, APF 4-/5 LLE: HF 5/5, KE 5/5,  ADF 5/5, APF 5/5 Sensory exam normal for light touch and pain in all 4 limbs.  No abnormal tone appreciated.   Assessment/Plan: 1. Functional deficits which require 3+ hours per day of interdisciplinary therapy in a comprehensive inpatient rehab setting. Physiatrist is providing close team supervision and 24 hour management of active medical problems listed below. Physiatrist and rehab team continue to assess barriers to discharge/monitor patient progress toward functional and medical goals  Care Tool:  Bathing              Bathing assist       Upper Body Dressing/Undressing Upper body dressing        Upper body assist      Lower Body Dressing/Undressing Lower body dressing            Lower body assist       Toileting Toileting    Toileting assist       Transfers Chair/bed transfer  Transfers assist  Locomotion Ambulation   Ambulation assist              Walk 10 feet activity   Assist           Walk 50 feet activity   Assist           Walk 150 feet activity   Assist           Walk 10 feet on uneven surface  activity   Assist           Wheelchair     Assist               Wheelchair 50 feet with 2 turns activity    Assist            Wheelchair 150 feet activity     Assist          Blood pressure 115/72, pulse 93, temperature 97.9 F (36.6 C), resp. rate 18, height 5\' 2"  (1.575 m), weight 93.1 kg, SpO2 97%.   Medical Problem List and Plan: 1. 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  Antithrombotics: -DVT/anticoagulation:  Pharmaceutical: Lovenox added             -antiplatelet therapy: DAPT X 3 weeks followed by ASA alone 3. Pain Management:  N/A 4. Mood/Behavior/Sleep: LCSW to follow for evaluation and support              -antipsychotic agents: N/A 5. Neuropsych/cognition: This patient is capable of making decisions on her own behalf. 6. Skin/Wound Care: Routine pressure relief measures.  7. Fluids/Electrolytes/Nutrition: Monitor I/O. Check CMET in am.  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 On Tresiba 16 units w/ Novolog 20 units TID ac/ Endo @ [facility]             --Continue Insulin gargline 30 units with 15 units TID ac --Monitor BS ac/hs and continue to educate on compliance, risk factors/benefits. She didn't take insulin consistently and does not wear CGM due to "excessive beeping" 8/27- CBGs 195-221- will increase Semglee to 33 units daily- first dose in AM 9. HTN: Monitor BP TID- continue to hold Lisinopril and hydrochlorothiazide.  Long-term goal normotensive --BP controlled currently  10. Hyponatremia: Question chronic @131 -132 range per chart review             --recheck BMET in am   8/27- Na 135 11. Hyperlipidemia: Trig-245/LDL-180. Now on increased dose Crestor 40 mg.  Dietary education 12. Hypomagnesemia: Recheck in am. Now on Mag ox daily. 8/27- Will check Mg in AM  13. Chronic leucocytosis: Trend WBC and monitor for signs of infection.              --improved from 13.4 to 11.9  8/27- will check U/ And Cx and CXR because up to 16k 14. Morbid obesity: BMI- 39.4. CM/HH diet. RD consulted to review diet.  --Educate patient on importance of weight loss and exercise to help promote health, improve AIC and mobility.  15. Constipation: Declines need for laxative. Advised that prns on board. Monitor for now.  8/27- changed/added Senna-S 1 tab at bedtime-     I spent a  total of 54   minutes on total care today- >50% coordination of care- due to  Reviewed chart and vitals, labs and notes- and Bgs'- and titration of insulin- w/u of leukocytosis up to 16k and team conference to determine length of stay    LOS: 1 days A FACE TO FACE EVALUATION WAS  PERFORMED  Lisa Werner 12/25/2022, 9:24 AM

## 2022-12-25 NOTE — Progress Notes (Signed)
Inpatient Rehabilitation Care Coordinator Assessment and Plan Patient Details  Name: Lisa Werner MRN: [id number] Date of Birth: 06/24/1980  Today's Date: 12/25/2022  Hospital Problems: Principal Problem:   Left pontine stroke [facility])  Past Medical History:  Past Medical History:  Diagnosis Date   Diabetes mellitus without complication (HCC)    Hyperlipidemia    Hypertension    Past Surgical History: No past surgical history on file. Social History:  reports that she has never smoked. She has never used smokeless tobacco. She reports that she does not drink alcohol and does not use drugs.  Family / Support Systems Marital Status: Single Patient Roles: Parent Spouse/Significant Other: N/a Children: 21 y.o. 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 Education: college Health Literacy - How often do you need to have someone help you when you read instructions, pamphlets, or other written material from your doctor or pharmacy?: Never Writes: Yes Employment Status: Employed Name of Employer: [employer] Length of Employment:  (3 years) Return to Work Plans: TBD Marine scientist Issues: Denies Guardian/Conservator: Chartered loss adjuster (father); no forms on file   Abuse/Neglect Abuse/Neglect Assessment Can Be Completed: Yes Physical Abuse: Denies Verbal Abuse: Denies Sexual Abuse: Denies Exploitation of patient/patient's resources: Denies Self-Neglect: Denies  Patient response to: Social Isolation - How often do you feel lonely or isolated from those  around you?: Never  Emotional Status Pt's affect, behavior and adjustment status: Pt presents with flat affect but pleasant. Recent Psychosocial Issues: Denies Psychiatric History: Denies Substance Abuse History: Denies  Patient / Family Perceptions, Expectations & Goals Pt/Family understanding of illness & functional limitations: pt has general understanding of pt care needs Premorbid pt/family roles/activities: Independent Anticipated changes in roles/activities/participation: Assistance with ADLs/IADLS Pt/family expectations/goals: pt goal is to work on being more independent  [employer]: None Premorbid Home Care/DME Agencies: None Transportation available at discharge: TBD Is the patient able to respond to transportation needs?: Yes In the past 12 months, has lack of transportation kept you from medical appointments or from getting medications?: No In the past 12 months, has lack of transportation kept you from meetings, work, or from getting things needed for daily living?: No Resource referrals recommended: Neuropsychology  Discharge Planning Living Arrangements: Children Support Systems: Children, Parent Type of Residence: Private residence Insurance Resources: Media planner (specify) Herbalist) Financial Resources: Employment Surveyor, quantity Screen Referred: No Living Expenses: Psychologist, sport and exercise Management: Patient Does the patient have any problems obtaining your medications?: No Home Management: Pt managed all home care needs Patient/Family Preliminary Plans: TBD Care Coordinator Barriers to Discharge: Decreased caregiver support, Lack of/limited family support, Insurance for SNF coverage Care Coordinator Anticipated Follow Up Needs: HH/OP Expected length of stay: 14 days  Clinical Impression SW met with pt to introduce self, explain role, and discuss discharge process. Pt is not veteran. No DME.   Gretchen Short 12/25/2022, 3:42 PM

## 2022-12-25 NOTE — Progress Notes (Signed)
Nutrition Brief Note:   MD consult for diet education. Pt was receiving patient care in bathroom at time of assessment. RD will attach diet education handouts to discharge paperwork and will attempt diet education in-person prior to discharge.   Bethann Humble, RD, LDN, CNSC.

## 2022-12-25 NOTE — Discharge Instructions (Addendum)
Inpatient Rehab Discharge Instructions  Justene Hinchcliffe Discharge date and time:  01/09/23  Activities/Precautions/ Functional Status: Activity: no lifting, driving, or strenuous exercise for till cleared by MD Diet: cardiac diet and diabetic diet Wound Care: none needed   Functional status:  ___ No restrictions     ___ Walk up steps independently ___ 24/7 supervision/assistance   ___ Walk up steps with assistance ___ Intermittent supervision/assistance  ___ Bathe/dress independently ___ Walk with walker     _X__ Bathe/dress with assistance ___ Walk Independently    ___ Shower independently ___ Walk with assistance    ___ Shower with assistance _X__ No alcohol     ___ Return to work/school ________     Special Instructions:   COMMUNITY REFERRALS UPON DISCHARGE:    Outpatient: PT     OT    ST             Agency:[facility]     Phone:[phone]              Appointment Date/Time:*Please expect follow-up within 7-10 business days to schedule your appointment. If you have not received follow-up, be sure to contact the site directly.*  Medical Equipment/Items Ordered:tub transfer bench and rolling walker                                                 Agency/Supplier:[facility] [phone]     STROKE/TIA DISCHARGE INSTRUCTIONS SMOKING Cigarette smoking nearly doubles your risk of having a stroke & is the single most alterable risk factor  If you smoke or have smoked in the last 12 months, you are advised to quit smoking for your health. Most of the excess cardiovascular risk related to smoking disappears within a year of stopping. Ask you doctor about anti-smoking medications [place] Quit Line: 1-800-QUIT NOW Free Smoking Cessation Classes [phone]  CHOLESTEROL Know your levels; limit fat & cholesterol in your diet  Lipid Panel     Component Value Date/Time   CHOL 265 (H) 12/20/2022 0133   CHOL 286 (H) 11/19/2018 1136   TRIG 245 (H) 12/20/2022 0133    HDL 36 (L) 12/20/2022 0133   HDL 46 11/19/2018 1136   CHOLHDL 7.4 12/20/2022 0133   VLDL 49 (H) 12/20/2022 0133   LDLCALC 180 (H) 12/20/2022 0133   LDLCALC 207 (H) 11/19/2018 1136     Many patients benefit from treatment even if their cholesterol is at goal. Goal: Total Cholesterol (CHOL) less than 160 Goal:  Triglycerides (TRIG) less than 150 Goal:  HDL greater than 40 Goal:  LDL (LDLCALC) less than 100   BLOOD PRESSURE American Stroke Association blood pressure target is less that 120/80 mm/Hg  Your discharge blood pressure is:  BP: 120/78 Monitor your blood pressure Limit your salt and alcohol intake Many individuals will require more than one medication for high blood pressure  DIABETES (A1c is a blood sugar average for last 3 months) Goal HGBA1c is under 7% (HBGA1c is blood sugar average for last 3 months)  Diabetes:    Lab Results  Component Value Date   HGBA1C >15.5 (H) 12/20/2022    Your HGBA1c can be lowered with medications, healthy diet, and exercise. Check your blood sugar as directed by your physician Call your physician if you experience unexplained or low blood sugars.  PHYSICAL ACTIVITY/REHABILITATION Goal is 30 minutes  at least 4 days per week  Activity: Increase activity slowly, and No driving, Therapies: see above Return to work: N/A Activity decreases your risk of heart attack and stroke and makes your heart stronger.  It helps control your weight and blood pressure; helps you relax and can improve your mood. Participate in a regular exercise program. Talk with your doctor about the best form of exercise for you (dancing, walking, swimming, cycling).  DIET/WEIGHT Goal is to maintain a healthy weight  Your discharge diet is:  Diet Order             Diet heart healthy/carb modified Room service appropriate? Yes; Fluid consistency: Thin  Diet effective now                   liquids Your height is:  Height: 5\' 2"  (157.5 cm) Your current weight is:  209 lbs Your Body Mass Index (BMI) is:  BMI (Calculated): 38.38 Following the type of diet specifically designed for you will help prevent another stroke. Your goal weight range is:  136. lbs4 Your goal Body Mass Index (BMI) is 19-24. Healthy food habits can help reduce 3 risk factors for stroke:  High cholesterol, hypertension, and excess weight.  RESOURCES Stroke/Support Group:  Call [phone]   STROKE EDUCATION PROVIDED/REVIEWED AND GIVEN TO PATIENT Stroke warning signs and symptoms How to activate emergency medical system (call 911). Medications prescribed at discharge. Need for follow-up after discharge. Personal risk factors for stroke. Pneumonia vaccine given:  Flu vaccine given:  My questions have been answered, the writing is legible, and I understand these instructions.  I will adhere to these goals & educational materials that have been provided to me after my discharge from the hospital.     My questions have been answered and I understand these instructions. I will adhere to these goals and the provided educational materials after my discharge from the hospital.  Patient/Caregiver Signature _______________________________ Date __________  Clinician Signature _______________________________________ Date __________  Please bring this form and your medication list with you to all your follow-up doctor's appointments. Plate Method for Diabetes   Foods with carbohydrates make your blood glucose level go up. The plate method is a simple way to meal plan and control the amount of carbohydrate you eat.         Use the following guidance to build a healthy plate to control carbohydrates. Divide a 9-inch plate into 3 sections, and consider your beverage the 4th section of your meal: Food Group Examples of Foods/Beverages for This Section of your Meal  Section 1: Non-starchy vegetables Fill  of your plate to include non-starchy vegetables Asparagus, broccoli, brussels sprouts,  cabbage, carrots, cauliflower, celery, cucumber, green beans, mushrooms, peppers, salad greens, tomatoes, or zucchini.  Section 2: Protein foods Fill  of your plate to include a lean protein Lean meat, poultry, fish, seafood, cheese, eggs, lean deli meat, tofu, beans, lentils, nuts or nut butters.  Section 3: Carbohydrate foods Fill  of your plate to include carbohydrate foods Whole grains, whole wheat bread, brown rice, whole grain pasta, polenta, corn tortillas, fruit, or starchy vegetables (potatoes, green peas, corn, beans, acorn squash, and butternut squash). One cup of milk also counts as a food that contains carbohydrate.  Section 4: Beverage Choose water or a low-calorie drink for your beverage. Unsweetened tea, coffee, or flavored/sparkling water without added sugar.  Image reprinted with permission from The American Diabetes Association.  Copyright 2022 by the American Diabetes Association.  Copyright 2022  Academy of Nutrition and Dietetics. All rights reserved   Carbohydrate Counting For People With Diabetes  Foods with carbohydrates make your blood glucose level go up. Learning how to count carbohydrates can help you control your blood glucose levels. First, identify the foods you eat that contain carbohydrates. Then, using the Foods with Carbohydrates chart, determine about how much carbohydrates are in your meals and snacks. Make sure you are eating foods with fiber, protein, and healthy fat along with your carbohydrate foods. Foods with Carbohydrates The following table shows carbohydrate foods that have about 15 grams of carbohydrate each. Using measuring cups, spoons, or a food scale when you first begin learning about carbohydrate counting can help you learn about the portion sizes you typically eat. The following foods have 15 grams carbohydrate each:  Grains 1 slice bread (1 ounce)  1 small tortilla (6-inch size)   large bagel (1 ounce)  1/3 cup pasta or rice (cooked)    hamburger or hot dog bun ( ounce)   cup cooked cereal   to  cup ready-to-eat cereal  2 taco shells (5-inch size) Fruit 1 small fresh fruit ( to 1 cup)   medium banana  17 small grapes (3 ounces)  1 cup melon or berries   cup canned or frozen fruit  2 tablespoons dried fruit (blueberries, cherries, cranberries, raisins)   cup unsweetened fruit juice  Starchy Vegetables  cup cooked beans, peas, corn, potatoes/sweet potatoes   large baked potato (3 ounces)  1 cup acorn or butternut squash  Snack Foods 3 to 6 crackers  8 potato chips or 13 tortilla chips ( ounce to 1 ounce)  3 cups popped popcorn  Dairy 3/4 cup (6 ounces) nonfat plain yogurt, or yogurt with sugar-free sweetener  1 cup milk  1 cup plain rice, soy, coconut or flavored almond milk Sweets and Desserts  cup ice cream or frozen yogurt  1 tablespoon jam, jelly, pancake syrup, table sugar, or honey  2 tablespoons light pancake syrup  1 inch square of frosted cake or 2 inch square of unfrosted cake  2 small cookies (2/3 ounce each) or  large cookie  Sometimes you'll have to estimate carbohydrate amounts if you don't know the exact recipe. One cup of mixed foods like soups can have 1 to 2 carbohydrate servings, while some casseroles might have 2 or more servings of carbohydrate. Foods that have less than 20 calories in each serving can be counted as "free" foods. Count 1 cup raw vegetables, or  cup cooked non-starchy vegetables as "free" foods. If you eat 3 or more servings at one meal, then count them as 1 carbohydrate serving.  Foods without Carbohydrates  Not all foods contain carbohydrates. Meat, some dairy, fats, non-starchy vegetables, and many beverages don't contain carbohydrate. So when you count carbohydrates, you can generally exclude chicken, pork, beef, fish, seafood, eggs, tofu, cheese, butter, sour cream, avocado, nuts, seeds, olives, mayonnaise, water, black coffee, unsweetened tea, and zero-calorie  drinks. Vegetables with no or low carbohydrate include green beans, cauliflower, tomatoes, and onions. How much carbohydrate should I eat at each meal?  Carbohydrate counting can help you plan your meals and manage your weight. Following are some starting points for carbohydrate intake at each meal. Work with your registered dietitian nutritionist to find the best range that works for your blood glucose and weight.   To Lose Weight To Maintain Weight  Women 2 - 3 carb servings 3 - 4 carb servings  Men 3 - 4 carb servings 4 - 5 carb servings  Checking your blood glucose after meals will help you know if you need to adjust the timing, type, or number of carbohydrate servings in your meal plan. Achieve and keep a healthy body weight by balancing your food intake and physical activity.  Tips How should I plan my meals?  Plan for half the food on your plate to include non-starchy vegetables, like salad greens, broccoli, or carrots. Try to eat 3 to 5 servings of non-starchy vegetables every day. Have a protein food at each meal. Protein foods include chicken, fish, meat, eggs, or beans (note that beans contain carbohydrate). These two food groups (non-starchy vegetables and proteins) are low in carbohydrate. If you fill up your plate with these foods, you will eat less carbohydrate but still fill up your stomach. Try to limit your carbohydrate portion to  of the plate.  What fats are healthiest to eat?  Diabetes increases risk for heart disease. To help protect your heart, eat more healthy fats, such as olive oil, nuts, and avocado. Eat less saturated fats like butter, cream, and high-fat meats, like bacon and sausage. Avoid trans fats, which are in all foods that list "partially hydrogenated oil" as an ingredient. What should I drink?  Choose drinks that are not sweetened with sugar. The healthiest choices are water, carbonated or seltzer waters, and tea and coffee without added sugars.  Sweet drinks will  make your blood glucose go up very quickly. One serving of soda or energy drink is  cup. It is best to drink these beverages only if your blood glucose is low.  Artificially sweetened, or diet drinks, typically do not increase your blood glucose if they have zero calories in them. Read labels of beverages, as some diet drinks do have carbohydrate and will raise your blood glucose. Label Reading Tips Read Nutrition Facts labels to find out how many grams of carbohydrate are in a food you want to eat. Don't forget: sometimes serving sizes on the label aren't the same as how much food you are going to eat, so you may need to calculate how much carbohydrate is in the food you are serving yourself.   Carbohydrate Counting for People with Diabetes Sample 1-Day Menu  Breakfast  cup yogurt, low fat, low sugar (1 carbohydrate serving)   cup cereal, ready-to-eat, unsweetened (1 carbohydrate serving)  1 cup strawberries (1 carbohydrate serving)   cup almonds ( carbohydrate serving)  Lunch 1, 5 ounce can chunk light tuna  2 ounces cheese, low fat cheddar  6 whole wheat crackers (1 carbohydrate serving)  1 small apple (1 carbohydrate servings)   cup carrots ( carbohydrate serving)   cup snap peas  1 cup 1% milk (1 carbohydrate serving)   Evening Meal Stir fry made with: 3 ounces chicken  1 cup brown rice (3 carbohydrate servings)   cup broccoli ( carbohydrate serving)   cup green beans   cup onions  1 tablespoon olive oil  2 tablespoons teriyaki sauce ( carbohydrate serving)  Evening Snack 1 extra small banana (1 carbohydrate serving)  1 tablespoon peanut butter   Carbohydrate Counting for People with Diabetes Vegan Sample 1-Day Menu  Breakfast 1 cup cooked oatmeal (2 carbohydrate servings)   cup blueberries (1 carbohydrate serving)  2 tablespoons flaxseeds  1 cup soymilk fortified with calcium and vitamin D  1 cup coffee  Lunch 2 slices whole wheat bread (2 carbohydrate servings)    cup  baked tofu   cup lettuce  2 slices tomato  2 slices avocado   cup baby carrots ( carbohydrate serving)  1 orange (1 carbohydrate serving)  1 cup soymilk fortified with calcium and vitamin D   Evening Meal Burrito made with: 1 6-inch corn tortilla (1 carbohydrate serving)  1 cup refried vegetarian beans (2 carbohydrate servings)   cup chopped tomatoes   cup lettuce   cup salsa  1/3 cup brown rice (1 carbohydrate serving)  1 tablespoon olive oil for rice   cup zucchini   Evening Snack 6 small whole grain crackers (1 carbohydrate serving)  2 apricots ( carbohydrate serving)   cup unsalted peanuts ( carbohydrate serving)    Carbohydrate Counting for People with Diabetes Vegetarian (Lacto-Ovo) Sample 1-Day Menu  Breakfast 1 cup cooked oatmeal (2 carbohydrate servings)   cup blueberries (1 carbohydrate serving)  2 tablespoons flaxseeds  1 egg  1 cup 1% milk (1 carbohydrate serving)  1 cup coffee  Lunch 2 slices whole wheat bread (2 carbohydrate servings)  2 ounces low-fat cheese   cup lettuce  2 slices tomato  2 slices avocado   cup baby carrots ( carbohydrate serving)  1 orange (1 carbohydrate serving)  1 cup unsweetened tea  Evening Meal Burrito made with: 1 6-inch corn tortilla (1 carbohydrate serving)   cup refried vegetarian beans (1 carbohydrate serving)   cup tomatoes   cup lettuce   cup salsa  1/3 cup brown rice (1 carbohydrate serving)  1 tablespoon olive oil for rice   cup zucchini  1 cup 1% milk (1 carbohydrate serving)  Evening Snack 6 small whole grain crackers (1 carbohydrate serving)  2 apricots ( carbohydrate serving)   cup unsalted peanuts ( carbohydrate serving)    Copyright 2020  Academy of Nutrition and Dietetics. All rights reserved.  Using Nutrition Labels: Carbohydrate  Serving Size  Look at the serving size. All the information on the label is based on this portion. Servings Per Container  The number of servings  contained in the package. Guidelines for Carbohydrate  Look at the total grams of carbohydrate in the serving size.  1 carbohydrate choice = 15 grams of carbohydrate. Range of Carbohydrate Grams Per Choice  Carbohydrate Grams/Choice Carbohydrate Choices  6-10   11-20 1  21-25 1  26-35 2  36-40 2  41-50 3  51-55 3  56-65 4  66-70 4  71-80 5    Copyright 2020  Academy of Nutrition and Dietetics. All rights reserved.

## 2022-12-25 NOTE — Evaluation (Signed)
Speech Language Pathology Assessment and Plan  Patient Details  Name: Lisa Werner MRN: [id number] Date of Birth: 1980/08/31  SLP Diagnosis: Dysarthria;Dysphagia;Cognitive Impairments  Rehab Potential: Excellent ELOS: 12-14 days    Today's Date: 12/25/2022 SLP Individual Time: 1000-1100 SLP Individual Time Calculation (min): 60 min   Hospital Problem: Principal Problem:   Left pontine stroke [facility])  Past Medical History:  Past Medical History:  Diagnosis Date   Diabetes mellitus without complication (HCC)    Hyperlipidemia    Hypertension    Past Surgical History: No past surgical history on file.  Assessment / Plan / Recommendation Clinical Impression HPI: History of Present Illness: Pt is a 42 y/o female with PMH of DM, HTN, and HLD admitted to [facility] on 12/19/22 from her PCP with 3 day history of R sided weakness.  In ED pt was hemodynamically stable with R hemiparesis, NIH 4 but increased to 5, CBG 536, WBC 13.4, sodium 131, anion gap 16.  She was not given tpa due to late presentation.  CT head initially without acute abnormality, but MRI showed hypodensity in the left pons.  No LVO on MRA head/neck but did show potential aneurysm versus infundibulum in the proximal right A2.    Clinical Impressions:   Beside Swallow Evaluation: Patient presents with mild s/sx of oropharyngeal dysphagia. Oral mechanism revealed mild R facial asymmetry, though functional strength and sensation. PO's offered included thin liquids, purees and solids. Patient consumed all PO's in a timely manner with trace oral residuals. During trials of thin liquids via straw, patient with x1 immediate cough and reports this occurs often. No other s/sx of aspiration present during trials of thin via cup, puree and/or solids. Recommend regular diet with thin liquids (no straws) and medications given whole in water.  Cognition: Patient evaluated via the Cognistat to assess cognitive linguistic  functioning. Patient scored WFL on all subtest's except calculations in which she presented with mild deficits. Noted awareness deficits over the course of the evaluation with reduced insight to deficits. For example, patient did not go to doctor despite experiencing R weakness for several days therefore raising concerns regarding safety awareness and judgement.   Dysarthria: Over the course of the evaluation, noted imprecise articulation likely due to R weakness. Patient was approximately 80% intelligible at the conversational level.   Pt would benefit from skilled ST intervention to maximize swallowing safety, speech intelligibility and problem solving skills to maximize her functional independence prior to d/c. Anticipate patient will require intermittent supervision at d/c with no f/u ST services anticipated at this time.    Skilled Therapeutic Interventions          Patient evaluated using a standardized cognitive linguistic assessment and bedside swallow evaluation to assess current cognitive, communicative and swallowing function. See above for details.   SLP Assessment  Patient will need skilled Speech Lanaguage Pathology Services during CIR admission    Recommendations  SLP Diet Recommendations: Thin;Age appropriate regular solids Liquid Administration via: Cup;No straw Medication Administration: Whole meds with liquid Supervision: Patient able to self feed Compensations: Slow rate;Small sips/bites;Minimize environmental distractions Postural Changes and/or Swallow Maneuvers: Seated upright 90 degrees Oral Care Recommendations: Oral care BID Patient destination: Home Follow up Recommendations: None Equipment Recommended: None recommended by SLP    SLP Frequency 1 to 3 out of 7 days   SLP Duration  SLP Intensity  SLP Treatment/Interventions 12-14 days  Minumum of 1-2 x/day, 30 to 90 minutes  Cognitive remediation/compensation;Dysphagia/aspiration precaution  training;Internal/external aids;Cueing hierarchy;Therapeutic  Activities;Functional tasks;Patient/family education;Therapeutic Exercise    Pain Pain Assessment Pain Scale: 0-10 Pain Score: 0-No pain  Prior Functioning Type of Home: Apartment  Lives With: Son (42 y/o) Available Help at Discharge: Family;Available PRN/intermittently (dad nearby, watching son) Vocation: Full time employment  SLP Evaluation Cognition Overall Cognitive Status: Impaired/Different from baseline Arousal/Alertness: Awake/alert Orientation Level: Oriented X4 Year: 2024 Month: August Day of Week: Correct Sustained Attention: Appears intact Memory: Appears intact Memory Impairment: Retrieval deficit Awareness: Impaired Awareness Impairment: Emergent impairment Problem Solving: Impaired Problem Solving Impairment: Verbal complex;Functional complex Safety/Judgment: Impaired  Comprehension Auditory Comprehension Overall Auditory Comprehension: Appears within functional limits for tasks assessed Yes/No Questions: Within Functional Limits Commands: Within Functional Limits Expression Expression Primary Mode of Expression: Verbal Verbal Expression Overall Verbal Expression: Appears within functional limits for tasks assessed Initiation: No impairment Repetition: No impairment Naming: No impairment Pragmatics: Impairment (flat affect) Written Expression Dominant Hand: Right Oral Motor Oral Motor/Sensory Function Overall Oral Motor/Sensory Function: Mild impairment Facial ROM: Reduced right Facial Symmetry: Abnormal symmetry right Facial Strength: Within Functional Limits Facial Sensation: Within Functional Limits Lingual ROM: Within Functional Limits Lingual Symmetry: Within Functional Limits Lingual Strength: Within Functional Limits Lingual Sensation: Within Functional Limits Velum: Within Functional Limits Mandible: Within Functional Limits Motor Speech Overall Motor Speech:  Impaired Respiration: Within functional limits Phonation: Normal Resonance: Within functional limits Articulation: Impaired Level of Impairment: Conversation Intelligibility: Intelligibility reduced Conversation: 75-100% accurate Motor Planning: Witnin functional limits Motor Speech Errors: Not applicable Effective Techniques: Slow rate;Over-articulate;Increased vocal intensity  Care Tool Care Tool Cognition Ability to hear (with hearing aid or hearing appliances if normally used Ability to hear (with hearing aid or hearing appliances if normally used): 0. Adequate - no difficulty in normal conservation, social interaction, listening to TV   Expression of Ideas and Wants Expression of Ideas and Wants: 3. Some difficulty - exhibits some difficulty with expressing needs and ideas (e.g, some words or finishing thoughts) or speech is not clear   Understanding Verbal and Non-Verbal Content Understanding Verbal and Non-Verbal Content: 4. Understands (complex and basic) - clear comprehension without cues or repetitions  Memory/Recall Ability Memory/Recall Ability : Current season;That he or she is in a hospital/hospital unit   Bedside Swallowing Assessment General Previous Swallow Assessment: 8/23 Diet Prior to this Study: Regular;Thin liquids (Level 0) Respiratory Status: Room air Behavior/Cognition: Alert;Cooperative;Pleasant mood Oral Cavity - Dentition: Adequate natural dentition Self-Feeding Abilities: Able to feed self Patient Positioning: Upright in bed Baseline Vocal Quality: Normal Volitional Cough: Weak Volitional Swallow: Able to elicit  Oral Care Assessment Oral Assessment  (WDL): Within Defined Limits Level of Consciousness: Alert Is patient on any of following O2 devices?: None of the above Nutritional status: No high risk factors Oral Assessment Risk : Low Risk Ice Chips Ice chips: Not tested Thin Liquid Thin Liquid: Impaired Presentation: Cup;Self  Fed;Straw Pharyngeal  Phase Impairments: Cough - Immediate Nectar Thick Nectar Thick Liquid: Not tested Honey Thick Honey Thick Liquid: Not tested Puree Puree: Within functional limits Presentation: Spoon;Self Fed Solid Solid: Within functional limits Presentation: Self Fed BSE Assessment Risk for Aspiration Impact on safety and function: Mild aspiration risk  Short Term Goals: Week 1: SLP Short Term Goal 1 (Week 1): Patient will utilize swallowing compensatory strategies during PO consumption to reduce risk of aspiration with min multimodal A SLP Short Term Goal 2 (Week 1): Patient will increase speech intelligibility to 90% at the conversational level with min multimodal A SLP Short Term Goal 3 (Week 1): Patient will demonstate  problem solving abilities during complex daily situations with min mulitmodal A SLP Short Term Goal 4 (Week 1): Patient will recall 2 cognitive and 2 physical changes since admission with min mulitmodal A  Refer to Care Plan for Long Term Goals  Recommendations for other services: None   Discharge Criteria: Patient will be discharged from SLP if patient refuses treatment 3 consecutive times without medical reason, if treatment goals not met, if there is a change in medical status, if patient makes no progress towards goals or if patient is discharged from hospital.  The above assessment, treatment plan, treatment alternatives and goals were discussed and mutually agreed upon: by patient  Donielle Kaigler M.A., CF-SLP 12/25/2022, 12:47 PM

## 2022-12-25 NOTE — Evaluation (Signed)
Occupational Therapy Assessment and Plan  Patient Details  Name: Lisa Werner MRN: [id number] Date of Birth: 1981-02-06  OT Diagnosis: hemiplegia affecting dominant side, muscle weakness (generalized), and progressive muscular atrophy Rehab Potential: Rehab Potential (ACUTE ONLY): Good ELOS: 12-16 days   Today's Date: 12/25/2022 OT Individual Time: 0845-1000 OT Individual Time Calculation (min): 75 min     Hospital Problem: Principal Problem:   Left pontine stroke [facility])   Past Medical History:  Past Medical History:  Diagnosis Date   Diabetes mellitus without complication (HCC)    Hyperlipidemia    Hypertension    Past Surgical History: No past surgical history on file.  Assessment & Plan Clinical Impression: Lisa Werner is a 42 year old RH- female with history of LADA/TIDM, HTN, morbid obesity who was admitted on 12/19/22 with four day history of right sided weakness with difficulty walking. She was sent over from MD office and BS 536 per EMS (had missed am dose insulin). CTA head/neck negative for LVO with area of caliber change L-V2 segment concerning for dissection. Mar[facility] Kitchen MRI brain done revealing acute infarct in left pons and MRA negative for occulusion and 1-2 mm outpouching from right A2 question tiny aneurysm v/s infundibulum. 2D echo showed EF 65-70% with moderate LVH, trivial TVR and negative bubble study. Patient reports she had slurred speech initially but this has improved. Neurology feels like her stroke is most likely due to small vessel disease. Dr. Cherie Dark recommended DAPT for stroke due to small vessel disease/uncontrolled risk factors for 3 weeks and then aspirin alone. .  Patient transferred to CIR on 12/24/2022 .    Patient currently requires mod with basic self-care skills secondary to muscle weakness, decreased cardiorespiratoy endurance, unbalanced muscle activation, decreased coordination, and decreased motor planning, and decreased sitting balance,  decreased standing balance, decreased postural control, hemiplegia, and decreased balance strategies.  Prior to hospitalization, patient could complete ADLs with independent .  Patient will benefit from skilled intervention to decrease level of assist with basic self-care skills and increase independence with basic self-care skills prior to discharge home with care partner.  Anticipate patient will require intermittent supervision and follow up outpatient.  OT - End of Session Activity Tolerance: Tolerates 30+ min activity with multiple rests Endurance Deficit: Yes OT Assessment Rehab Potential (ACUTE ONLY): Good OT Barriers to Discharge: Inaccessible home environment OT Barriers to Discharge Comments: Pt reports ~20 stairs to enter apartment after ambulating through parking lot OT Patient demonstrates impairments in the following area(s): Balance;Cognition;Endurance;Motor;Safety OT Basic ADL's Functional Problem(s): Grooming;Bathing;Dressing;Toileting OT Advanced ADL's Functional Problem(s): Light Housekeeping OT Transfers Functional Problem(s): Toilet;Tub/Shower OT Additional Impairment(s): Fuctional Use of Upper Extremity OT Plan OT Intensity: Minimum of 1-2 x/day, 45 to 90 minutes OT Frequency: 5 out of 7 days OT Duration/Estimated Length of Stay: 12-16 days OT Treatment/Interventions: Balance/vestibular training;Discharge planning;Self Care/advanced ADL retraining;Therapeutic Activities;UE/LE Coordination activities;Cognitive remediation/compensation;Functional mobility training;Patient/family education;Therapeutic Exercise;Community reintegration;DME/adaptive equipment instruction;Neuromuscular re-education;UE/LE Strength taining/ROM OT Self Feeding Anticipated Outcome(s): mod I OT Basic Self-Care Anticipated Outcome(s): mod I OT Toileting Anticipated Outcome(s): mod I OT Bathroom Transfers Anticipated Outcome(s): mod I OT Recommendation Recommendations for Other Services: Therapeutic  Recreation consult Therapeutic Recreation Interventions: Pet therapy;Outing/community reintergration Patient destination: Home Follow Up Recommendations: Outpatient OT Equipment Recommended: 3 in 1 bedside comode;Rolling walker with 5" wheels;Tub/shower bench;To be determined Equipment Details: does not own any DME   OT Evaluation Precautions/Restrictions  Precautions Precautions: Fall Precaution Comments: R hemi, inattention Restrictions Weight Bearing Restrictions: No General Chart Reviewed: Yes Family/Caregiver  Present: No Pain Pain Assessment Pain Scale: 0-10 Pain Score: 0-No pain Home Living/Prior Functioning Home Living Family/patient expects to be discharged to:: Private residence Living Arrangements: Children Available Help at Discharge: Family, Available PRN/intermittently (dad nearby, watching son) Type of Home: Apartment Home Access: Stairs to enter (walks from parking lot to apartment, then steps to access apartment once inside) Secretary/administrator of Steps: pt reports about 20 Entrance Stairs-Rails: Right Home Layout: One level, Able to live on main level with bedroom/bathroom Bathroom Shower/Tub: Tub/shower unit, Engineer, building services: Standard Bathroom Accessibility: Yes Additional Comments: works as a Therapist, nutritional; 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 that is related to her (pt's father is taking care of him at present)  Lives With: Son (90 y/o) Prior Function Level of Independence: Independent with basic ADLs, Independent with gait  Able to Take Stairs?: Yes Driving: Yes Vocation: Full time employment Leisure: Hobbies-yes (Comment) Vision Baseline Vision/History: 0 No visual deficits Ability to See in Adequate Light: 0 Adequate Patient Visual Report: Other (comment) (reports reading can be blurry) Vision Assessment?: Vision impaired- to be further tested in functional context Eye Alignment: Within Functional Limits Ocular Range of Motion: Within  Functional Limits Alignment/Gaze Preference: Within Defined Limits Tracking/Visual Pursuits: Able to track stimulus in all quads without difficulty Perception  Perception: Within Functional Limits Praxis Praxis: WFL Cognition Cognition Overall Cognitive Status: Impaired/Different from baseline Arousal/Alertness: Awake/alert Orientation Level: Person;Place;Situation Person: Oriented Place: Oriented Situation: Oriented Memory: Appears intact Memory Impairment: Retrieval deficit Sustained Attention: Appears intact Awareness: Impaired Awareness Impairment: Emergent impairment Problem Solving: Impaired Problem Solving Impairment: Verbal complex;Functional complex Safety/Judgment: Impaired Brief Interview for Mental Status (BIMS) Repetition of Three Words (First Attempt): 3 Temporal Orientation: Year: Correct Temporal Orientation: Month: Accurate within 5 days Temporal Orientation: Day: Correct Recall: "Sock": Yes, no cue required Recall: "Blue": Yes, no cue required Recall: "Bed": Yes, no cue required BIMS Summary Score: 15 Sensation Sensation Light Touch: Appears Intact Hot/Cold: Appears Intact Proprioception: Appears Intact Stereognosis: Not tested Coordination Gross Motor Movements are Fluid and Coordinated: No Fine Motor Movements are Fluid and Coordinated: No Coordination and Movement Description: RUE impaired, weaker than LUE and delayed reactions Motor  Motor Motor: Hemiplegia Motor - Skilled Clinical Observations: R hemi, dominant side  Trunk/Postural Assessment  Cervical Assessment Cervical Assessment: Exceptions to [facility] (forward head) Thoracic Assessment Thoracic Assessment: Exceptions to [facility] (rounded shoulders) Lumbar Assessment Lumbar Assessment: Exceptions to [facility] (posterior pelvic tilt) Postural Control Postural Control: Deficits on evaluation Righting Reactions: delayed  Balance Balance Balance Assessed: Yes Static Sitting Balance Static Sitting -  Balance Support: Bilateral upper extremity supported;Feet supported Static Sitting - Level of Assistance: 5: Stand by assistance Dynamic Sitting Balance Dynamic Sitting - Balance Support: During functional activity Dynamic Sitting - Level of Assistance: 4: Min assist Dynamic Sitting - Balance Activities: Lateral lean/weight shifting;Reaching for objects;Reaching across midline Static Standing Balance Static Standing - Balance Support: Left upper extremity supported;Right upper extremity supported Static Standing - Level of Assistance: 4: Min assist Dynamic Standing Balance Dynamic Standing - Balance Support: During functional activity Dynamic Standing - Level of Assistance: 3: Mod assist Dynamic Standing - Balance Activities: Reaching for weighted objects;Lateral lean/weight shifting;Forward lean/weight shifting;Reaching for objects Extremity/Trunk Assessment RUE Assessment RUE Assessment: Exceptions to [facility] Active Range of Motion (AROM) Comments: WFL for ROM, although delayed d/t CVA General Strength Comments: 3+/5 overall proximal/distal, 3/5 gross grasp LUE Assessment LUE Assessment: Within Functional Limits Active Range of Motion (AROM) Comments: WFL General Strength Comments: 4+/5 overall proximal/distal  Care Tool  Care Tool Self Care Eating   Eating Assist Level: Set up assist    Oral Care    Oral Care Assist Level: Supervision/Verbal cueing    Bathing   Body parts bathed by patient: Right arm;Left arm;Chest;Front perineal area;Abdomen;Face;Right upper leg;Left upper leg Body parts bathed by helper: Buttocks;Left lower leg;Right lower leg   Assist Level: Minimal Assistance - Patient > 75%    Upper Body Dressing(including orthotics)   What is the patient wearing?: Bra;Pull over shirt   Assist Level: Minimal Assistance - Patient > 75%    Lower Body Dressing (excluding footwear)   What is the patient wearing?: Underwear/pull up;Pants Assist for lower body dressing:  Moderate Assistance - Patient 50 - 74%    Putting on/Taking off footwear   What is the patient wearing?: Non-skid slipper socks Assist for footwear: Maximal Assistance - Patient 25 - 49%       Care Tool Toileting Toileting activity   Assist for toileting: Minimal Assistance - Patient > 75%     Care Tool Bed Mobility Roll left and right activity   Roll left and right assist level: Contact Guard/Touching assist    Sit to lying activity   Sit to lying assist level: Contact Guard/Touching assist    Lying to sitting on side of bed activity   Lying to sitting on side of bed assist level: the ability to move from lying on the back to sitting on the side of the bed with no back support.: Contact Guard/Touching assist     Care Tool Transfers Sit to stand transfer   Sit to stand assist level: Minimal Assistance - Patient > 75%    Chair/bed transfer   Chair/bed transfer assist level: Minimal Assistance - Patient > 75%     Toilet transfer   Assist Level: Minimal Assistance - Patient > 75%     Care Tool Cognition  Expression of Ideas and Wants Expression of Ideas and Wants: 3. Some difficulty - exhibits some difficulty with expressing needs and ideas (e.g, some words or finishing thoughts) or speech is not clear  Understanding Verbal and Non-Verbal Content Understanding Verbal and Non-Verbal Content: 4. Understands (complex and basic) - clear comprehension without cues or repetitions   Memory/Recall Ability Memory/Recall Ability : Current season;That he or she is in a hospital/hospital unit   Refer to Care Plan for Long Term Goals  SHORT TERM GOAL WEEK 1 OT Short Term Goal 1 (Week 1): Pt will complete LB dressing Min A with AE as necessary OT Short Term Goal 2 (Week 1): Pt will complete toilet transfer CGA with only occasional cues for safety awareness  Recommendations for other services: Therapeutic Recreation  Pet therapy and Outing/community reintegration   Skilled Therapeutic  Intervention ADL ADL Equipment Provided: Reacher Eating: Set up Where Assessed-Eating: Bed level Grooming: Supervision/safety Where Assessed-Grooming: Sitting at sink Upper Body Bathing: Minimal assistance Where Assessed-Upper Body Bathing: Shower Lower Body Bathing: Minimal assistance Where Assessed-Lower Body Bathing: Shower Upper Body Dressing: Minimal assistance Where Assessed-Upper Body Dressing: Edge of bed Lower Body Dressing: Moderate assistance Where Assessed-Lower Body Dressing: Edge of bed (and standing) Toileting: Minimal assistance Where Assessed-Toileting: Toilet;Bedside Commode (BSC over toilet) Toilet Transfer: Minimal assistance Toilet Transfer Method: Ambulating Toilet Transfer Equipment: Drop arm bedside commode Tub/Shower Transfer: Not assessed Film/video editor: Minimal assistance Film/video editor Method: Designer, industrial/product: Transfer tub bench;Grab bars ADL Comments: VC for safety awareness during mobility and ADLs and RW management, assistance with lower extremity washing,  slight Rt side inattention noted Mobility  Bed Mobility Bed Mobility: Rolling Right;Rolling Left;Supine to Sit;Sit to Supine Rolling Right: Contact Guard/Touching assist Rolling Left: Contact Guard/Touching assist Supine to Sit: Contact Guard/Touching assist Sit to Supine: Contact Guard/Touching assist Transfers Sit to Stand: Minimal Assistance - Patient > 75% Stand to Sit: Minimal Assistance - Patient > 75%   1:1 evaluation and treatment session initiated this date. OT roles, goals and purpose discussed with pt as well as therapy schedule. Pt presenting with flat affect although pleasant and cooperative. ADL completed this date with levels of assist listed above. Pt requiring VC for safety awareness during ADL and functional mobility. Pt issued RUE RW splint for increased grip on RW. Pt issued pink sponge and directed on exercises in order to increase RUE  grip/pinch strength. Pt would benefit from skilled OT in IPR setting in order to maximize independence with ADLs upon D/C.    Discharge Criteria: Patient will be discharged from OT if patient refuses treatment 3 consecutive times without medical reason, if treatment goals not met, if there is a change in medical status, if patient makes no progress towards goals or if patient is discharged from hospital.  The above assessment, treatment plan, treatment alternatives and goals were discussed and mutually agreed upon: by patient  Velia Meyer, OTD, OTR/L 12/25/2022, 12:41 PM

## 2022-12-25 NOTE — Plan of Care (Signed)
  Problem: RH Swallowing Goal: LTG Patient will consume least restrictive diet using compensatory strategies with assistance (SLP) Description: LTG:  Patient will consume least restrictive diet using compensatory strategies with assistance (SLP) Flowsheets (Taken 12/25/2022 1244) LTG: Pt Patient will consume least restrictive diet using compensatory strategies with assistance of (SLP): Modified Independent   Problem: RH Expression Communication Goal: LTG Patient will increase speech intelligibility (SLP) Description: LTG: Patient will increase speech intelligibility at word/phrase/conversation level with cues, % of the time (SLP) Flowsheets (Taken 12/25/2022 1244) LTG: Patient will increase speech intelligibility (SLP): Modified Independent Level: Conversation level Percent of time patient will use intelligible speech: 90   Problem: RH Problem Solving Goal: LTG Patient will demonstrate problem solving for (SLP) Description: LTG:  Patient will demonstrate problem solving for basic/complex daily situations with cues  (SLP) Flowsheets (Taken 12/25/2022 1244) LTG: Patient will demonstrate problem solving for (SLP): Complex daily situations LTG Patient will demonstrate problem solving for: Modified Independent   Problem: RH Awareness Goal: LTG: Patient will demonstrate awareness during functional activites type of (SLP) Description: LTG: Patient will demonstrate awareness during functional activites type of (SLP) Flowsheets (Taken 12/25/2022 1244) Patient will demonstrate during cognitive/linguistic activities awareness type of: Emergent LTG: Patient will demonstrate awareness during cognitive/linguistic activities with assistance of (SLP): Modified Independent

## 2022-12-25 NOTE — Plan of Care (Signed)
  Problem: RH Eating Goal: LTG Patient will perform eating w/assist, cues/equip (OT) Description: LTG: Patient will perform eating with assist, with/without cues using equipment (OT) Flowsheets (Taken 12/25/2022 1245) LTG: Pt will perform eating with assistance level of: Independent with assistive device    Problem: RH Grooming Goal: LTG Patient will perform grooming w/assist,cues/equip (OT) Description: LTG: Patient will perform grooming with assist, with/without cues using equipment (OT) Flowsheets (Taken 12/25/2022 1245) LTG: Pt will perform grooming with assistance level of: Independent with assistive device    Problem: RH Bathing Goal: LTG Patient will bathe all body parts with assist levels (OT) Description: LTG: Patient will bathe all body parts with assist levels (OT) Flowsheets (Taken 12/25/2022 1245) LTG: Pt will perform bathing with assistance level/cueing: Independent with assistive device    Problem: RH Dressing Goal: LTG Patient will perform upper body dressing (OT) Description: LTG Patient will perform upper body dressing with assist, with/without cues (OT). Flowsheets (Taken 12/25/2022 1245) LTG: Pt will perform upper body dressing with assistance level of: Independent with assistive device Goal: LTG Patient will perform lower body dressing w/assist (OT) Description: LTG: Patient will perform lower body dressing with assist, with/without cues in positioning using equipment (OT) Flowsheets (Taken 12/25/2022 1245) LTG: Pt will perform lower body dressing with assistance level of: Independent with assistive device   Problem: RH Toileting Goal: LTG Patient will perform toileting task (3/3 steps) with assistance level (OT) Description: LTG: Patient will perform toileting task (3/3 steps) with assistance level (OT)  Flowsheets (Taken 12/25/2022 1245) LTG: Pt will perform toileting task (3/3 steps) with assistance level: Independent with assistive device   Problem: RH Functional  Use of Upper Extremity Goal: LTG Patient will use RT/LT upper extremity as a (OT) Description: LTG: Patient will use right/left upper extremity as a stabilizer/gross assist/diminished/nondominant/dominant level with assist, with/without cues during functional activity (OT) Flowsheets (Taken 12/25/2022 1245) LTG: Use of upper extremity in functional activities: RUE as dominant level LTG: Pt will use upper extremity in functional activity with assistance level of: Independent with assistive device   Problem: RH Light Housekeeping Goal: LTG Patient will perform light housekeeping w/assist (OT) Description: LTG: Patient will perform light housekeeping with assistance, with/without cues (OT). Flowsheets (Taken 12/25/2022 1245) LTG: Pt will perform light housekeeping with assistance level of: Independent with assistive device   Problem: RH Toilet Transfers Goal: LTG Patient will perform toilet transfers w/assist (OT) Description: LTG: Patient will perform toilet transfers with assist, with/without cues using equipment (OT) Flowsheets (Taken 12/25/2022 1245) LTG: Pt will perform toilet transfers with assistance level of: Independent with assistive device   Problem: RH Tub/Shower Transfers Goal: LTG Patient will perform tub/shower transfers w/assist (OT) Description: LTG: Patient will perform tub/shower transfers with assist, with/without cues using equipment (OT) Flowsheets (Taken 12/25/2022 1245) LTG: Pt will perform tub/shower stall transfers with assistance level of: Independent with assistive device

## 2022-12-25 NOTE — Patient Care Conference (Signed)
Inpatient RehabilitationTeam Conference and Plan of Care Update Date: 12/25/2022   Time: 11:02 AM   Patient Name: Lisa Werner      Medical Record Number: [id number]  Date of Birth: [birth year] Sex: Female         Room/Bed: 4M06C/4M06C-01 Payor Info: Payor: [employer] / Plan: [facility] PPO / Product Type: *No Product type* /    Admit Date/Time:  12/24/2022  3:12 PM  Primary Diagnosis:  Left pontine stroke [facility])  Hospital Problems: Principal Problem:   Left pontine stroke [facility])    Expected Discharge Date: Expected Discharge Date:  (2 weeks)  Team Members Present: Physician leading conference: Dr. Genice Rouge Social Worker Present: Cecile Sheerer, LCSWA Nurse Present: Vedia Pereyra, RN PT Present: Bernie Covey, PT OT Present: Velia Meyer, OT SLP Present: Feliberto Gottron, SLP PPS Coordinator present : Fae Pippin, SLP     Current Status/Progress Goal Weekly Team Focus  Bowel/Bladder   Contintent to bowel and bladder with some episodes of incontient to bowel LBM 8/27   Patient to regain complete continence to bowel   assess every shift and prn    Swallow/Nutrition/ Hydration   Eval Pending           ADL's   Mod A LB ADLs, Min A UB ADLs, Mod A bathing, Min A with functional transfers/ambulation with reports from pt of Rt knee feeling "like it'll give out", delayed ROM of RUE although WFL, poor safety awareness   supervision-Mod I   LB dressing with AE as necessary, increased safety awareness overall and with RW, barriers: many steps to enter apartment, lives alone with son    Mobility   Eval pending   Eval pending  Eval pending    Communication   Eval Pending            Safety/Cognition/ Behavioral Observations  Eval Pending            Pain   pt denies pain   pt to remain pain free   assess every shift and prn    Skin   Skin intact   skin to remain intact  assess every shift and PRN      Discharge  Planning:  TBA. Per EMR, pt to discharge to home- and her father and stepmother will help assist. SW will confirm there are no barriers to discharge.   Team Discussion: Left pontine stroke. Addressing constipation. Denies pain. Skin is CDI. WBC trending up. Diet education on-going with A1C of 15.5.  Stopped wearing Dexcom due to constant beeping. Daily weights. Tolerating carb-mod diet. AC/HS. PT/ST pending evaluations.  Very poor safety awareness. Has 20 steps to get up to apartment.  Patient on target to meet rehab goals: Evaluations pending with estimated 2 weeks LOS  *See Care Plan and progress notes for long and short-term goals.   Revisions to Treatment Plan:  Senna S added.  U/A with C&S.  Chest xray. RD and Diabetes educator. Monitor labs/VS Teaching Needs: Medications, safety, self care, diet/lifestyle modifications, gait/transfer training, etc.   Current Barriers to Discharge: Decreased caregiver support, Home enviroment access/layout, New diabetic, Weight, and Medication compliance  Possible Resolutions to Barriers: Family education Able to go up steps safely Adhere to appropriate diet/lifestyle modifications  Compliance with antidiabetic medications Order recommended DME      Medical Summary Current Status: CVA- L pontine stroke- constipation- LBM soft yesterday- continent; working on diet and CBG's-  Barriers to Discharge: Behavior/Mood;Uncontrolled Diabetes;Morbid Obesity;Medical stability;Uncontrolled Hypertension;Self-care  education  Barriers to Discharge Comments: hasn't been seen by PT- has been seen by OT_ not SLP either-  doesn't want to participate- limited by poor safety awareness and R knee "weakness"- lives alone (dad helping); and out of control DM- A1c >15.5- has 20 steps to enter home; Possible Resolutions to [employer] Focus: DM educator, dietary education; changing Insulin dosing; checking CXR/U/A and Cx due to chronic leukocytosis-  d/c ~ 2  weeks   Continued Need for Acute Rehabilitation Level of Care: The patient requires daily medical management by a physician with specialized training in physical medicine and rehabilitation for the following reasons: Direction of a multidisciplinary physical rehabilitation program to maximize functional independence : Yes Medical management of patient stability for increased activity during participation in an intensive rehabilitation regime.: Yes Analysis of laboratory values and/or radiology reports with any subsequent need for medication adjustment and/or medical intervention. : Yes   I attest that I was present, lead the team conference, and concur with the assessment and plan of the team.   Jearld Adjutant 12/25/2022, 2:53 PM

## 2022-12-25 NOTE — Evaluation (Signed)
Physical Therapy Assessment and Plan  Patient Details  Name: Lisa Werner MRN: [id number] Date of Birth: [birth year]  PT Diagnosis: Abnormality of gait, Difficulty walking, and Hemiparesis dominant Rehab Potential: Excellent ELOS: 12-16 days   Today's Date: 12/25/2022 PT Individual Time: 1300-1415 PT Individual Time Calculation (min): 75 min    Hospital Problem: Principal Problem:   Left pontine stroke [facility])   Past Medical History:  Past Medical History:  Diagnosis Date   Diabetes mellitus without complication (HCC)    Hyperlipidemia    Hypertension    Past Surgical History: No past surgical history on file.  Assessment & Plan Clinical Impression: Chastin S. Brenning is a 42 year old RH- female with history of LADA/TIDM, HTN, morbid obesity who was admitted on 12/19/22 with four day history of right sided weakness with difficulty walking. She was sent over from MD office and BS 536 per EMS (had missed am dose insulin). CTA head/neck negative for LVO with area of caliber change L-V2 segment concerning for dissection. Marland Kitchen MRI brain done revealing acute infarct in left pons and MRA negative for occulusion and 1-2 mm outpouching from right A2 question tiny aneurysm v/s infundibulum. 2D echo showed EF 65-70% with moderate LVH, trivial TVR and negative bubble study. Patient reports she had slurred speech initially but this has improved. Neurology feels like her stroke is most likely due to small vessel disease. Dr. Cherie Dark recommended DAPT for stroke due to small vessel disease/uncontrolled risk factors for 3 weeks and then aspirin alone.   Patient transferred to CIR on 12/24/2022 .   Patient currently requires min with mobility secondary to muscle weakness and muscle joint tightness, impaired timing and sequencing, abnormal tone, unbalanced muscle activation, decreased coordination, and decreased motor planning, visual changes, and decreased standing balance, decreased postural  control, hemiplegia, and decreased balance strategies.  Prior to hospitalization, patient was independent  with mobility and lived with Son (67 yo) in a Apartment home.  Home access is pt reports about 20Stairs to enter (upper level apartment, ~20 stairs).  Patient will benefit from skilled PT intervention to maximize safe functional mobility, minimize fall risk, and decrease caregiver burden for planned discharge home with intermittent assist.  Anticipate patient will benefit from follow up [facility] at discharge.  PT - End of Session Activity Tolerance: Tolerates 30+ min activity with multiple rests Endurance Deficit: Yes Endurance Deficit Description: pt sleeping on arrival ager previous therapy session PT Assessment Rehab Potential (ACUTE/IP ONLY): Excellent PT Barriers to Discharge: Decreased caregiver support;Home environment access/layout;Inaccessible home environment;Lack of/limited family support;Incontinence PT Patient demonstrates impairments in the following area(s): Balance;Safety;Endurance;Motor;Sensory PT Transfers Functional Problem(s): Car;Bed to Chair;Bed Mobility PT Locomotion Functional Problem(s): Ambulation;Stairs;Wheelchair Mobility PT Plan PT Intensity: Minimum of 1-2 x/day ,45 to 90 minutes PT Frequency: 5 out of 7 days PT Duration Estimated Length of Stay: 12-16 days PT Treatment/Interventions: Ambulation/gait training;DME/adaptive equipment instruction;Discharge planning;Functional mobility training;Psychosocial support;Splinting/orthotics;Therapeutic Activities;UE/LE Strength taining/ROM;Visual/perceptual remediation/compensation;Balance/vestibular training;Community reintegration;Disease management/prevention;Functional electrical stimulation;Patient/family education;Neuromuscular re-education;Stair training;Therapeutic Exercise;UE/LE Coordination activities;Wheelchair propulsion/positioning PT Transfers Anticipated Outcome(s): mod I PT Locomotion Anticipated Outcome(s): mod  I with LRAD PT Recommendation Follow Up Recommendations: Home health PT Patient destination: Home Equipment Recommended: To be determined   PT Evaluation Precautions/Restrictions Precautions Precautions: Fall Precaution Comments: R hemi, inattention Restrictions Weight Bearing Restrictions: No General   Vital SignsTherapy Vitals Temp: 98 F (36.7 C) Pulse Rate: 98 Resp: 16 BP: 114/84 Patient Position (if appropriate): Sitting Oxygen Therapy SpO2: 98 % O2 Device: Room Air Pain   Pain Interference  Pain Interference Pain Effect on Sleep: 0. Does not apply - I have not had any pain or hurting in the past 5 days Pain Interference with Therapy Activities: 0. Does not apply - I have not received rehabilitationtherapy in the past 5 days Pain Interference with Day-to-Day Activities: 1. Rarely or not at all Home Living/Prior Functioning Home Living Living Arrangements: Children Available Help at Discharge: Family;Available PRN/intermittently Type of Home: Apartment Home Access: Stairs to enter (upper level apartment, ~20 stairs) Entrance Stairs-Number of Steps: pt reports about 20 Entrance Stairs-Rails: Right Home Layout: One level;Able to live on main level with bedroom/bathroom  Lives With: Son (2 yo) Prior Function Level of Independence: Independent with basic ADLs;Independent with gait;Independent with transfers  Able to Take Stairs?: Yes Driving: Yes Vocation: Full time employment Vision/Perception  Vision - History Ability to See in Adequate Light: 0 Adequate Perception Perception: Within Functional Limits Praxis Praxis: WFL  Cognition Overall Cognitive Status: Impaired/Different from baseline Arousal/Alertness: Awake/alert Orientation Level: Oriented X4 Year: 2024 Month: August Day of Week: Correct Sustained Attention: Appears intact Memory: Appears intact Awareness: Impaired Awareness Impairment: Emergent impairment Problem Solving: Impaired Problem  Solving Impairment: Verbal complex;Functional complex Safety/Judgment: Impaired Sensation Sensation Light Touch: Appears Intact Proprioception: Appears Intact Coordination Gross Motor Movements are Fluid and Coordinated: No Coordination and Movement Description: RUE impaired, weaker than LUE and delayed reactions Motor  Motor Motor: Hemiplegia Motor - Skilled Clinical Observations: R hemi, dominant side   Trunk/Postural Assessment  Cervical Assessment Cervical Assessment: Within Functional Limits Thoracic Assessment Thoracic Assessment: Within Functional Limits Lumbar Assessment Lumbar Assessment: Within Functional Limits Postural Control Postural Control: Deficits on evaluation Righting Reactions: delayed  Balance Balance Balance Assessed: Yes Static Sitting Balance Static Sitting - Balance Support: Bilateral upper extremity supported;Feet supported Static Sitting - Level of Assistance: 5: Stand by assistance Dynamic Sitting Balance Dynamic Sitting - Balance Support: During functional activity Dynamic Sitting - Level of Assistance: 4: Min assist Dynamic Sitting - Balance Activities: Lateral lean/weight shifting;Reaching for objects;Reaching across midline Static Standing Balance Static Standing - Balance Support: Left upper extremity supported;Right upper extremity supported Static Standing - Level of Assistance: 4: Min assist Dynamic Standing Balance Dynamic Standing - Balance Support: During functional activity Dynamic Standing - Level of Assistance: 3: Mod assist Dynamic Standing - Balance Activities: Reaching for weighted objects;Lateral lean/weight shifting;Forward lean/weight shifting;Reaching for objects Extremity Assessment  RUE Assessment RUE Assessment: Exceptions to [facility] Active Range of Motion (AROM) Comments: WFL for ROM, although delayed d/t CVA General Strength Comments: 3+/5 overall proximal/distal, 3/5 gross grasp LUE Assessment LUE Assessment: Within  Functional Limits Active Range of Motion (AROM) Comments: WFL General Strength Comments: 4+/5 overall proximal/distal RLE Assessment RLE Assessment: Exceptions to [facility] General Strength Comments: Hip 3+/5, knee 3+/5, ankle 4+/5 LLE Assessment LLE Assessment: Within Functional Limits General Strength Comments: Grossly 5/5  Care Tool Care Tool Bed Mobility Roll left and right activity   Roll left and right assist level: Contact Guard/Touching assist    Sit to lying activity   Sit to lying assist level: Contact Guard/Touching assist    Lying to sitting on side of bed activity   Lying to sitting on side of bed assist level: the ability to move from lying on the back to sitting on the side of the bed with no back support.: Contact Guard/Touching assist     Care Tool Transfers Sit to stand transfer   Sit to stand assist level: Minimal Assistance - Patient > 75%    Chair/bed transfer  Chair/bed transfer assist level: Minimal Assistance - Patient > 75%     Psychologist, clinical transfer assist level: Minimal Assistance - Patient > 75%      Care Tool Locomotion Ambulation   Assist level: Moderate Assistance - Patient 50 - 74% Assistive device: Walker-rolling Max distance: 110  Walk 10 feet activity   Assist level: Minimal Assistance - Patient > 75% Assistive device: Walker-rolling   Walk 50 feet with 2 turns activity   Assist level: Moderate Assistance - Patient - 50 - 74% Assistive device: Walker-rolling  Walk 150 feet activity Walk 150 feet activity did not occur: Safety/medical concerns      Walk 10 feet on uneven surfaces activity Walk 10 feet on uneven surfaces activity did not occur: Safety/medical concerns      Stairs   Assist level: Moderate Assistance - Patient - 50 - 74% Stairs assistive device: 2 hand rails Max number of stairs: 4  Walk up/down 1 step activity   Walk up/down 1 step (curb) assist level: Moderate Assistance - Patient - 50  - 74% Walk up/down 1 step or curb assistive device: 2 hand rails  Walk up/down 4 steps activity   Walk up/down 4 steps assist level: Moderate Assistance - Patient - 50 - 74% Walk up/down 4 steps assistive device: 2 hand rails  Walk up/down 12 steps activity Walk up/down 12 steps activity did not occur: Safety/medical concerns      Pick up small objects from floor Pick up small object from the floor (from standing position) activity did not occur: Safety/medical concerns      Wheelchair Is the patient using a wheelchair?: Yes Type of Wheelchair: Manual   Wheelchair assist level: Dependent - Patient 0%    Wheel 50 feet with 2 turns activity   Assist Level: Dependent - Patient 0%  Wheel 150 feet activity   Assist Level: Dependent - Patient 0%    Refer to Care Plan for Long Term Goals  SHORT TERM GOAL WEEK 1 PT Short Term Goal 1 (Week 1): Pt walks >100 ft with min A or better PT Short Term Goal 2 (Week 1): Pt will maintain RW when turning and walking without cue PT Short Term Goal 3 (Week 1): Pt will ambulate 12 steps with assist  Recommendations for other services: None   Skilled Therapeutic Intervention Evaluation completed (see details above) with patient education regarding purpose of PT evaluation, PT POC and goals, therapy schedule, weekly team meetings, and other CIR information including safety plan and fall risk safety. Pt performed the below functional mobility tasks with the specified levels of skilled cuing and assistance.  MMT and sensory testing performed in sitting.   Five times Sit to Stand Test (FTSS) Method: Use a straight back chair with a solid seat that is 16-18" high. Ask participant to sit on the chair with arms folded across their chest.   Instructions: "Stand up and sit down as quickly as possible 5 times, keeping your arms folded across your chest."   Measurement: Stop timing when the participant stands the 5th time.  TIME: __13.29____ (in  seconds)  Times > 13.6 seconds is associated with increased disability and morbidity (Guralnik, 2000) Times > 15 seconds is predictive of recurrent falls in healthy individuals aged 56 and older (Buatois, et al., 2008) Normal performance values in community dwelling individuals aged 84 and older (Bohannon, 2006): 60-69 years: 11.4 seconds 70-79 years: 12.6 seconds  80-89 years: 14.8 seconds  MCID: ? 2.3 seconds for Vestibular Disorders (Meretta, 2006)   Pt requires min A overall for transfers and gait, but up to mod A with fatigue. Note impaired coordination during gait with knee hyperextension and occasional near buckling noted.  Pt performed Sit to stand progressed to squats with cueing to equally load BLE. Pt able to report she can feel a difference but not fully self correct. Pt remained seated in w/c at end of session and was left with all needs in reach and alarm active.    Mobility Bed Mobility Bed Mobility: Rolling Right;Rolling Left;Supine to Sit;Sit to Supine Rolling Right: Contact Guard/Touching assist Rolling Left: Contact Guard/Touching assist Supine to Sit: Contact Guard/Touching assist Sit to Supine: Contact Guard/Touching assist Transfers Transfers: Sit to Stand;Stand to Sit;Stand Pivot Transfers Sit to Stand: Minimal Assistance - Patient > 75% Stand to Sit: Minimal Assistance - Patient > 75% Stand Pivot Transfers: Minimal Assistance - Patient > 75% Stand Pivot Transfer Details: Tactile cues for initiation;Visual cues for safe use of DME/AE;Verbal cues for sequencing;Verbal cues for safe use of DME/AE;Verbal cues for precautions/safety Transfer (Assistive device): Rolling walker Locomotion  Gait Ambulation: Yes Gait Assistance: Moderate Assistance - Patient 50-74% Gait Distance (Feet): 110 Feet Assistive device: Rolling walker Gait Assistance Details: Manual facilitation for weight shifting;Manual facilitation for weight bearing;Verbal cues for safe use of  DME/AE;Verbal cues for precautions/safety;Verbal cues for sequencing;Verbal cues for technique;Verbal cues for gait pattern Gait Gait: Yes Gait Pattern: Step-through pattern;Right genu recurvatum Stairs / Additional Locomotion Stairs: Yes Stairs Assistance: Moderate Assistance - Patient 50 - 74% Stair Management Technique: Two rails Ramp: Moderate Assistance - Patient 50 - 74% Wheelchair Mobility Wheelchair Mobility: Yes Wheelchair Assistance: Dependent - Patient 0% (pt's chair not set up appropriately for hemi propulsion) Wheelchair Parts Management: Needs assistance   Discharge Criteria: Patient will be discharged from PT if patient refuses treatment 3 consecutive times without medical reason, if treatment goals not met, if there is a change in medical status, if patient makes no progress towards goals or if patient is discharged from hospital.  The above assessment, treatment plan, treatment alternatives and goals were discussed and mutually agreed upon: by patient  Juluis Rainier 12/25/2022, 4:21 PM

## 2022-12-26 DIAGNOSIS — I639 Cerebral infarction, unspecified: Secondary | ICD-10-CM | POA: Diagnosis not present

## 2022-12-26 LAB — URINE CULTURE

## 2022-12-26 LAB — GLUCOSE, CAPILLARY
Glucose-Capillary: 182 mg/dL — ABNORMAL HIGH (ref 70–99)
Glucose-Capillary: 228 mg/dL — ABNORMAL HIGH (ref 70–99)
Glucose-Capillary: 160 mg/dL — ABNORMAL HIGH (ref 70–99)
Glucose-Capillary: 246 mg/dL — ABNORMAL HIGH (ref 70–99)

## 2022-12-26 LAB — MAGNESIUM: Magnesium: 2 mg/dL (ref 1.7–2.4)

## 2022-12-26 NOTE — Progress Notes (Incomplete)
Occupational Therapy Session Note  Patient Details  Name: Reganne Gering MRN: [id number] Date of Birth: [birth year]  {CHL IP REHAB OT TIME CALCULATIONS:[id number]}   Short Term Goals: Week 1:  OT Short Term Goal 1 (Week 1): Pt will complete LB dressing Min A with AE as necessary OT Short Term Goal 2 (Week 1): Pt will complete toilet transfer CGA with only occasional cues for safety awareness  Skilled Therapeutic Interventions/Progress Updates:     Patient agreeable to participate in OT session. Reports *** pain level.   Patient participated in skilled OT session focusing on ***. Therapist facilitated/assessed/developed/educated/integrated/elicited *** in order to improve/facilitate/promote   Therapy Documentation Precautions:  Precautions Precautions: Fall Precaution Comments: R hemi, inattention Restrictions Weight Bearing Restrictions: No   Therapy/Group: Individual Therapy  Limmie Patricia, OTR/L,CBIS  Supplemental OT - [facility] and [facility] Secure Chat Preferred   12/26/2022, 9:45 PM

## 2022-12-26 NOTE — Progress Notes (Signed)
Speech Language Pathology Daily Session Note  Patient Details  Name: Tynita Sullens MRN: [id number] Date of Birth: [birth year]  Today's Date: 12/26/2022 SLP Individual Time: 0901-1000 SLP Individual Time Calculation (min): 59 min  Short Term Goals: Week 1: SLP Short Term Goal 1 (Week 1): Patient will utilize swallowing compensatory strategies during PO consumption to reduce risk of aspiration with min multimodal A SLP Short Term Goal 2 (Week 1): Patient will increase speech intelligibility to 90% at the conversational level with min multimodal A SLP Short Term Goal 3 (Week 1): Patient will demonstate problem solving abilities during complex daily situations with min mulitmodal A SLP Short Term Goal 4 (Week 1): Patient will recall 2 cognitive and 2 physical changes since admission with min mulitmodal A  Skilled Therapeutic Interventions:  Pt was seen in am to address cognitive re- training and speech intelligibility. Pt was alert and seated upright in recliner upon SLP arrival. SLP observed decreased vocal intensity, flat affect, and occasional imprecise articulation due to oral weakness. SLP reviewed speech intelligibility strategies with focus on amplification and over articulation. External visual aid of strategies provided and left in pt's room for carryover. SLP engaged pt in structured task to address implementation of strategies. Pt requiring mod A for use of strategies with limited carryover observed. Auditory feedback initiated with pt identifying "soft voice." SLP further identified flat affect and reduced intelligibility in subsequent trials pt continued to require min to mod A for use of over articulation and amplification strategies. In additional minutes of session, SLP initiated medication management task. SLP reviewed current medication list with pt identifying familiar meds including insulins and Crestor. SLP discussed new medications and rationale with pt verbalized  understanding. SLP engaged pt in error awareness task. Given a schematic of a BID pill box pt identified errors initially requiring min A which was able to be reduced to sup A. She also answered scenario specific questions with 80% acc with min A. In missed opportunities, SLP provided correct response and rationale. Pt verbalized understanding. Direct handoff to NT with pt's chair alarm active and call button within reach. SLP to continue POC.   Pain Pain Assessment Pain Scale: 0-10 Pain Score: 0-No pain  Therapy/Group: Individual Therapy  Renaee Munda 12/26/2022, 11:31 AM

## 2022-12-26 NOTE — Care Management (Signed)
Inpatient Rehabilitation Center Individual Statement of Services  Patient Name:  Lisa Werner  Date:  12/26/2022  Welcome to the Inpatient Rehabilitation Center.  Our goal is to provide you with an individualized program based on your diagnosis and situation, designed to meet your specific needs.  With this comprehensive rehabilitation program, you will be expected to participate in at least 3 hours of rehabilitation therapies Monday-Friday, with modified therapy programming on the weekends.  Your rehabilitation program will include the following services:  Physical Therapy (PT), Occupational Therapy (OT), Speech Therapy (ST), 24 hour per day rehabilitation nursing, Therapeutic Recreaction (TR), Psychology, Neuropsychology, Care Coordinator, Rehabilitation Medicine, Nutrition Services, Pharmacy Services, and Other  Weekly team conferences will be held on Tuesdays to discuss your progress.  Your Inpatient Rehabilitation Care Coordinator will talk with you frequently to get your input and to update you on team discussions.  Team conferences with you and your family in attendance may also be held.  Expected length of stay: 12-16 days    Overall anticipated outcome: Independent with Assistive Device  Depending on your progress and recovery, your program may change. Your Inpatient Rehabilitation Care Coordinator will coordinate services and will keep you informed of any changes. Your Inpatient Rehabilitation Care Coordinator's name and contact numbers are listed  below.  The following services may also be recommended but are not provided by the Inpatient Rehabilitation Center:  Driving Evaluations Home Health Rehabiltiation Services Outpatient Rehabilitation Services Vocational Rehabilitation   Arrangements will be made to provide these services after discharge if needed.  Arrangements include referral to agencies that provide these services.  Your insurance has been verified to be:   BCBS  Your primary doctor is:  Julieanne Manson  Pertinent information will be shared with your doctor and your insurance company.  Inpatient Rehabilitation Care Coordinator:  Susie Cassette [phone] or ([phone]  Information discussed with and copy given to patient by: Gretchen Short, 12/26/2022, 11:24 AM

## 2022-12-26 NOTE — Progress Notes (Signed)
Physical Therapy Session Note  Patient Details  Name: Lisa Werner MRN: [id number] Date of Birth: Aug 15, 1980  Today's Date: 12/26/2022 PT Individual Time: 1015-1115 and 1415-1500 PT Individual Time Calculation (min): 60 min and 45 min  Short Term Goals: Week 1:  PT Short Term Goal 1 (Week 1): Pt walks >100 ft with min A or better PT Short Term Goal 2 (Week 1): Pt will maintain RW when turning and walking without cue PT Short Term Goal 3 (Week 1): Pt will ambulate 12 steps with assist  Skilled Therapeutic Interventions/Progress Updates: Pt presented in recliner agreeable to therapy. Pt denies pain during session. Pt performed stand pivot transfer to w/c with RW and minA. Pt transported to day room for time management and energy conservation. Completed stand pivot in same manner as prior to high/low mat. Pt then completed standing balance activity reaching for horseshoes with RUE for forced use and placing on basketball rim. Pt was able to complete task with decreased UE support and was ultimately able to remove hand from RW all together. Pt then completed Berg Balance Assessment with a score of 37/56  (<36= high risk for falls, close to 100%; 37-45 significant >80%; 46-51 moderate >50%; 52-55 lower >25%). Pt required some increased time to complete task due to fatigue however was able to complete successfully. Lisa Werner (therapy dog) arrived and pt having pleasant interaction with dog with PTA encouraging use of R hand to pet dog. Pt's affect appeared to lightened affect after interaction. Pt then worked on Investment banker, operational ambulating ~59ft with RW and minA. Pt required cues for safety with RW while performing turns as well as minimizing knee hyperextension during stance phase of RLE. Pt returned to w/c and transported back to room. Pt completed ambulatory transfer to recliner at end of session and left with belt alarm on, call bell within reach and needs met.   Tx2: Pt presented in bed agreeable  to therapy. Pt denies pain during session. Session focused on gait training, NMR, and functional transfers. Completed bed mobility with CGA and use of bed features. PTA donned shoes for time management. Pt completed ambulatory transfer to w/c with RW and CGA. Pt transported to main gym for time management. In gym PTA set up cones and pt completed weaving through cones 94ft with CGA. Pt noted to have decreased awareness of RLE with occasional overpowering when stepping causing significant increased step length. PTA then added 2.5lb weighted cuff on RLE for improved proprioceptive feedback. Pt with improved step length and coordination after activity. PTA then removed hand splint and bulked up R hand rest with washcloth and coban to allow pt to use grip. Pt then ambulated 60ft with CGA, verbal cues for pacing and improved RW management. Pt completed Sit to stand with LLE on 2in block for increased recruitment of RLE 2 x 5 with CGA. Pt requiring intermittent rest breaks due to fatigue. Pt then ambulated ~20ft without AD and HHA with [place], mild compensatory trunk rotation, and mild forward flexed posture. Pt transported back to room at end of session and completed ambulatory transfer to bed with HHA and CGA. Pt transferred to supine with supervision and use of bed features. Pt left resting in bed with bed alarm on, call bell within reach and needs met.        Therapy Documentation Precautions:  Precautions Precautions: Fall Precaution Comments: R hemi, inattention Restrictions Weight Bearing Restrictions: No General:   Vital Signs: Therapy Vitals Temp: 97.9 F (36.6 C) Pulse  Rate: 82 Resp: 17 BP: 95/67 Patient Position (if appropriate): Lying Oxygen Therapy SpO2: 97 % O2 Device: Room Air Pain:   Mobility:   Locomotion :    Trunk/Postural Assessment :    Balance:   Exercises:   Other Treatments:      Therapy/Group: Individual Therapy  Lisa Werner 12/26/2022, 4:10 PM

## 2022-12-26 NOTE — Progress Notes (Signed)
Recreational Therapy Session Note  Patient Details  Name: Lisa Werner MRN: [id number] Date of Birth: February 26, 1981 Today's Date: 12/26/2022 Np c/o pain. Pt participated in animal assisted activity seated EOM with supervision.  Pt easily engaged with pet partner team and was appreciative of this visit.Marland Kitchen  Pt response:  Lisa Werner 12/26/2022, 12:17 PM

## 2022-12-26 NOTE — Progress Notes (Signed)
Patient ID: Lisa Werner, female   DOB: 19-Dec-1980, 42 y.o.   MRN: [id number]  SW left message for pt step-mother Lisa Werner as pt reported pt father is having phone issues. SW informed on ELOS, and requested return phone call to discuss discharge plan. SW waiting on follow-up.   Cecile Sheerer, MSW, LCSWA Office: [phone] Cell: [phone] Fax: [phone]

## 2022-12-26 NOTE — Progress Notes (Signed)
PROGRESS NOTE   Subjective/Complaints:  Pt denies any issues Says LBM yesterday Said not morning person so not waking up.    ROS: Limited by sleepiness- didn't want to wake up  Objective:   DG CHEST PORT 1 VIEW  Result Date: 12/25/2022 CLINICAL DATA:  Leukocytosis, hypertension EXAM: PORTABLE CHEST 1 VIEW COMPARISON:  02/25/2012 FINDINGS: Single frontal view of the chest demonstrates an unremarkable cardiac silhouette. No acute airspace disease, effusion, or pneumothorax. No acute bony abnormalities. IMPRESSION: 1. No acute intrathoracic process. Electronically Signed   By: Sharlet Salina M.D.   On: 12/25/2022 14:45   Recent Labs    12/25/22 0754  WBC 16.1*  HGB 12.6  HCT 39.3  PLT 256   Recent Labs    12/25/22 0754  NA 135  K 4.6  CL 103  CO2 22  GLUCOSE 175*  BUN 12  CREATININE 0.93  CALCIUM 9.1    Intake/Output Summary (Last 24 hours) at 12/26/2022 1010 Last data filed at 12/25/2022 1700 Gross per 24 hour  Intake 236 ml  Output --  Net 236 ml        Physical Exam: Vital Signs Blood pressure 120/82, pulse 93, temperature 97.7 F (36.5 C), temperature source Oral, resp. rate 18, height 5\' 2"  (1.575 m), weight 92.9 kg, SpO2 98%.     General: asleep- woke briefly to say no issues then closed eyes and wouldn't talk further; NAD HENT:  oropharynx moist CV: regular rate and rhythm; no JVD Pulmonary: CTA B/L; no W/R/R- good air movement GI: soft, NT, ND, (+)BS Psychiatric: won't wake up  Neurological: very sleepy- hard to assess Neuro: Alert and oriented x 3, she is able to provide a clear and coherent history, follows commands, cranial nerves II through XII intact besides shoulder shrug is a little decreased on the right, able to name and repeat, no dysarthria or aphasia noted RUE: 4-/5 Deltoid, 4-/5 Biceps, 4-/5 Triceps,  4-/5 Grip LUE: 5/5 Deltoid, 5/5 Biceps, 5/5 Triceps, 5/5 Wrist Ext, 5/5  Grip RLE: HF 4-/5, KE 4-/5, ADF 4-/5, APF 4-/5 LLE: HF 5/5, KE 5/5, ADF 5/5, APF 5/5 Sensory exam normal for light touch and pain in all 4 limbs.  No abnormal tone appreciated.   Assessment/Plan: 1. Functional deficits which require 3+ hours per day of interdisciplinary therapy in a comprehensive inpatient rehab setting. Physiatrist is providing close team supervision and 24 hour management of active medical problems listed below. Physiatrist and rehab team continue to assess barriers to discharge/monitor patient progress toward functional and medical goals  Care Tool:  Bathing    Body parts bathed by patient: Right arm, Left arm, Chest, Front perineal area, Abdomen, Face, Right upper leg, Left upper leg   Body parts bathed by helper: Buttocks, Left lower leg, Right lower leg     Bathing assist Assist Level: Minimal Assistance - Patient > 75%     Upper Body Dressing/Undressing Upper body dressing   What is the patient wearing?: Bra, Pull over shirt    Upper body assist Assist Level: Minimal Assistance - Patient > 75%    Lower Body Dressing/Undressing Lower body dressing      What is the  patient wearing?: Underwear/pull up, Pants     Lower body assist Assist for lower body dressing: Moderate Assistance - Patient 50 - 74%     Toileting Toileting    Toileting assist Assist for toileting: Contact Guard/Touching assist     Transfers Chair/bed transfer  Transfers assist     Chair/bed transfer assist level: Minimal Assistance - Patient > 75%     Locomotion Ambulation   Ambulation assist      Assist level: Moderate Assistance - Patient 50 - 74% Assistive device: Walker-rolling Max distance: 110   Walk 10 feet activity   Assist     Assist level: Minimal Assistance - Patient > 75% Assistive device: Walker-rolling   Walk 50 feet activity   Assist    Assist level: Moderate Assistance - Patient - 50 - 74% Assistive device: Walker-rolling    Walk  150 feet activity   Assist Walk 150 feet activity did not occur: Safety/medical concerns         Walk 10 feet on uneven surface  activity   Assist Walk 10 feet on uneven surfaces activity did not occur: Safety/medical concerns         Wheelchair     Assist Is the patient using a wheelchair?: Yes Type of Wheelchair: Manual    Wheelchair assist level: Dependent - Patient 0%      Wheelchair 50 feet with 2 turns activity    Assist        Assist Level: Dependent - Patient 0%   Wheelchair 150 feet activity     Assist      Assist Level: Dependent - Patient 0%   Blood pressure 120/82, pulse 93, temperature 97.7 F (36.5 C), temperature source Oral, resp. rate 18, height 5\' 2"  (1.575 m), weight 92.9 kg, SpO2 98%.   Medical Problem List and Plan: 1. 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  Antithrombotics: -DVT/anticoagulation:  Pharmaceutical: Lovenox added             -antiplatelet therapy: DAPT X 3 weeks followed by ASA alone 3. Pain Management:  N/A 4. Mood/Behavior/Sleep: LCSW to follow for evaluation and support             -antipsychotic agents: N/A 5. Neuropsych/cognition: This patient is capable of making decisions on her own behalf. 6. Skin/Wound Care: Routine pressure relief measures.  7. Fluids/Electrolytes/Nutrition: Monitor I/O. Check CMET in am.  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 On Tresiba 16 units w/ Novolog 20 units TID ac/ Endo @ [facility]             --Continue Insulin gargline 30 units with 15 units TID ac --Monitor BS ac/hs and continue to educate on compliance, risk factors/benefits. She didn't take insulin consistently and does not wear CGM  due to "excessive beeping" 8/27- CBGs 195-221- will increase Semglee to 33 units daily- first dose in AM 8/28- CBGs 182 this AM- but hasn't started new dosing of Semglee- will monitor for trend.  9. HTN: Monitor BP TID- continue to  hold Lisinopril and hydrochlorothiazide.  Long-term goal normotensive --BP controlled currently  10. Hyponatremia: Question chronic @131 -132 range per chart review             --recheck BMET in am   8/27- Na 135 11. Hyperlipidemia: Trig-245/LDL-180. Now on increased dose Crestor 40 mg.  Dietary education 12. Hypomagnesemia: Recheck in am. Now on Mag ox daily. 8/27- Will check Mg in AM  8/28- mg hasn't bene done- not clear if pt refused labs or what.  13. Chronic leucocytosis: Trend WBC and monitor for signs of infection.              --improved from 13.4 to 11.9  8/27- will check U/ And Cx and CXR because up to 16k  8/28- U/ A negative- has large bacteria, which could be colonization but otherwise negative- CXR negative.  14. Morbid obesity: BMI- 39.4. CM/HH diet. RD consulted to review diet.  --Educate patient on importance of weight loss and exercise to help promote health, improve AIC and mobility.  15. Constipation: Declines need for laxative. Advised that prns on board. Monitor for now.  8/27- changed/added Senna-S 1 tab at bedtime-  8/28- small BM yesterday AM- will monitor and if doesn't go by tomorrow, will give sorbitol.    I spent a total of  36  minutes on total care today- >50% coordination of care- due to  Complex medical issues with DM, as wlel as review of labs and CBGs and vitals.       LOS: 2 days A FACE TO FACE EVALUATION WAS PERFORMED  Phelicia Dantes 12/26/2022, 10:10 AM

## 2022-12-26 NOTE — Progress Notes (Signed)
Verified that both RD and diabetes educator are seeing patient.

## 2022-12-26 NOTE — Progress Notes (Signed)
Occupational Therapy Session Note  Patient Details  Name: Lisa Werner MRN: [id number] Date of Birth: 05/25/1980  Today's Date: 12/26/2022 OT Individual Time: 3235-5732 OT Individual Time Calculation (min): 40 min    Short Term Goals: Week 1:  OT Short Term Goal 1 (Week 1): Pt will complete LB dressing Min A with AE as necessary OT Short Term Goal 2 (Week 1): Pt will complete toilet transfer CGA with only occasional cues for safety awareness  Skilled Therapeutic Interventions/Progress Updates:  Skilled OT intervention completed with focus on ADL retraining, ambulatory transfers. Pt received semi supine in bed, asleep, requiring environmental stimuli and increased time to arouse. 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 Pt agreeable to session. No pain reported.  Pt not very receptive to completing bed mobility independently via Rt sidelying. Instead reached Rt hand out to therapist. Transitioned to EOB with min HHA for trunk elevation with cues needed to scoot for feet to contact floor. Cues needed to reach RUE onto RW splint, then CGA sit > stand using RW and min A ambulatory transfer > toilet. Pt is quick to move and demos slight annoyance with use of RW as pt with sloppy positioning of RW. Able to doff clothing down with CGA for balance. Continent of urinary void only, wiped seated with supervision. Stood with CGA, and with time donned clothing over hips. Cues again needed for hands on RW, then min A ambulatory transfer > w/c at sink, with min A needed to correct balance when overstepping/backstepping as pt left RW far away from chair in front of her. Education provided on avoiding this and staying within RW frame. Pt stated "man I'm ready to get out of here." Discussed ELOS as an initial recommendation but with increased awareness of safety could  go sooner. She doesn't feel like she is far from her baseline "if using the walker."  Seated at sink, pt requested wash up items, however decided not to doff clothing. Washed face, donned deo and brushed teeth with supervision with cues for how to open toothbrush with 1 handed technique 2/2 decreased FMC. Declined further bathing or changing of clothes. Donned tennis shoes with total A, as pt unable to coordinate figure 4. CGA sit > stand using RW, then stand pivot with CGA > recliner.   Pt remained seated in recliner, with belt alarm on/activated, set up with breakfast (NT notified as no signs indicated full supervision but pt did need cues to take smaller bites) and with all needs in reach at end of session.   Therapy Documentation Precautions:  Precautions Precautions: Fall Precaution Comments: R hemi, inattention Restrictions Weight Bearing Restrictions: No    Therapy/Group: Individual Therapy  Melvyn Novas, MS, OTR/L  12/26/2022, 12:30 PM

## 2022-12-27 DIAGNOSIS — I639 Cerebral infarction, unspecified: Secondary | ICD-10-CM | POA: Diagnosis not present

## 2022-12-27 LAB — GLUCOSE, CAPILLARY
Glucose-Capillary: 278 mg/dL — ABNORMAL HIGH (ref 70–99)
Glucose-Capillary: 375 mg/dL — ABNORMAL HIGH (ref 70–99)
Glucose-Capillary: 263 mg/dL — ABNORMAL HIGH (ref 70–99)
Glucose-Capillary: 386 mg/dL — ABNORMAL HIGH (ref 70–99)

## 2022-12-27 MED ORDER — INSULIN GLARGINE-YFGN 100 UNIT/ML ~~LOC~~ SOLN
37.0000 [IU] | Freq: Every day | SUBCUTANEOUS | Status: DC
  Administered 2022-12-28 – 2022-12-30 (×3): 37 [IU] via SUBCUTANEOUS
  Filled 2022-12-27 (×4): qty 0.37

## 2022-12-27 NOTE — Progress Notes (Signed)
Physical Therapy Session Note  Patient Details  Name: Lisa Werner MRN: [id number] Date of Birth: 1980-05-27  Today's Date: 12/27/2022 PT Individual Time: 0800-0900 PT Individual Time Calculation (min): 60 min   Short Term Goals: Week 1:  PT Short Term Goal 1 (Week 1): Pt walks >100 ft with min A or better PT Short Term Goal 2 (Week 1): Pt will maintain RW when turning and walking without cue PT Short Term Goal 3 (Week 1): Pt will ambulate 12 steps with assist  Skilled Therapeutic Interventions/Progress Updates:   pt received in bed and agreeable to therapy. No complaint of pain.   Supine>sit with supervision. Sit to stand with CGA-supervision throughout session, cueing required for safe use of RW and reaching back to chair with mod carryover.   Session focused on gait and NMR for improved gait. Pt performed circuit of the following exercises x 6 rounds with 10-30 second rest breaks and 1.5# ankle weight on RLE for improved proprioception and effort: Standing marches x 30 seconds HS curl x 8-10 Gait x 90 ft.   During gait trials, pt demoes toe drag and mild circumduction throughout, as well as hip swing compensation pattern, which worsens with fatigue. With cueing, pt was able to greatly improved toe catch and foot clearance.  Pt then performed Sit to stand with LLE 3 x 6-8 reps for forced use of RLE. Cues and manual facilitation for control of R knee to prevent hyperextension or buckling.   Pt ambulated back to room with min A overall, with gait as described above. Improving foot clearance from start of session.  Pt remained in recliner and was left with all needs in reach and alarm active.   Therapy Documentation Precautions:  Precautions Precautions: Fall Precaution Comments: R hemi, inattention Restrictions Weight Bearing Restrictions: No General:       Therapy/Group: Individual Therapy  Juluis Rainier 12/27/2022, 12:37 PM

## 2022-12-27 NOTE — Progress Notes (Signed)
PROGRESS NOTE   Subjective/Complaints:  Pt reports LBM yesterday- was medium  Didn't remember me, but also have seen when she's asleep last 2 days.   Denies pain- using L hand ot feed self.  Slept "OK".  ROS:  Pt denies SOB, abd pain, CP, N/V/C/D, and vision changes  Except for HPI Objective:   DG CHEST PORT 1 VIEW  Result Date: 12/25/2022 CLINICAL DATA:  Leukocytosis, hypertension EXAM: PORTABLE CHEST 1 VIEW COMPARISON:  02/25/2012 FINDINGS: Single frontal view of the chest demonstrates an unremarkable cardiac silhouette. No acute airspace disease, effusion, or pneumothorax. No acute bony abnormalities. IMPRESSION: 1. No acute intrathoracic process. Electronically Signed   By: Sharlet Salina M.D.   On: 12/25/2022 14:45   Recent Labs    12/25/22 0754  WBC 16.1*  HGB 12.6  HCT 39.3  PLT 256   Recent Labs    12/25/22 0754  NA 135  K 4.6  CL 103  CO2 22  GLUCOSE 175*  BUN 12  CREATININE 0.93  CALCIUM 9.1    Intake/Output Summary (Last 24 hours) at 12/27/2022 0902 Last data filed at 12/26/2022 1700 Gross per 24 hour  Intake 138 ml  Output --  Net 138 ml        Physical Exam: Vital Signs Blood pressure (!) 105/54, pulse 90, temperature 98.3 F (36.8 C), resp. rate 16, height 5\' 2"  (1.575 m), weight 93.2 kg, SpO2 96%.      General: sitting up at EOB, sleepy, but eating; tray almost empty; NAD HENT: conjugate gaze; oropharynx moist CV: regular rate and rhythm; no JVD Pulmonary: CTA B/L; no W/R/R- good air movement GI: soft, NT, ND, (+)BS- more normoactive Psychiatric: slightly delayed, but more interactive today Neurological: alert- slightly delayed responses, but are appropriate  Neuro: Alert and oriented x 3, she is able to provide a clear and coherent history, follows commands, cranial nerves II through XII intact besides shoulder shrug is a little decreased on the right, able to name and repeat,  no dysarthria or aphasia noted RUE: 4-/5 Deltoid, 4-/5 Biceps, 4-/5 Triceps,  4-/5 Grip LUE: 5/5 Deltoid, 5/5 Biceps, 5/5 Triceps, 5/5 Wrist Ext, 5/5 Grip RLE: HF 4-/5, KE 4-/5, ADF 4-/5, APF 4-/5 LLE: HF 5/5, KE 5/5, ADF 5/5, APF 5/5 Sensory exam normal for light touch and pain in all 4 limbs.  No abnormal tone appreciated.   Assessment/Plan: 1. Functional deficits which require 3+ hours per day of interdisciplinary therapy in a comprehensive inpatient rehab setting. Physiatrist is providing close team supervision and 24 hour management of active medical problems listed below. Physiatrist and rehab team continue to assess barriers to discharge/monitor patient progress toward functional and medical goals  Care Tool:  Bathing    Body parts bathed by patient: Right arm, Left arm, Chest, Front perineal area, Abdomen, Face, Right upper leg, Left upper leg   Body parts bathed by helper: Buttocks, Left lower leg, Right lower leg     Bathing assist Assist Level: Minimal Assistance - Patient > 75%     Upper Body Dressing/Undressing Upper body dressing   What is the patient wearing?: Bra, Pull over shirt    Upper body assist  Assist Level: Minimal Assistance - Patient > 75%    Lower Body Dressing/Undressing Lower body dressing      What is the patient wearing?: Underwear/pull up, Pants     Lower body assist Assist for lower body dressing: Moderate Assistance - Patient 50 - 74%     Toileting Toileting    Toileting assist Assist for toileting: Contact Guard/Touching assist     Transfers Chair/bed transfer  Transfers assist     Chair/bed transfer assist level: Minimal Assistance - Patient > 75%     Locomotion Ambulation   Ambulation assist      Assist level: Moderate Assistance - Patient 50 - 74% Assistive device: Walker-rolling Max distance: 110   Walk 10 feet activity   Assist     Assist level: Minimal Assistance - Patient > 75% Assistive device:  Walker-rolling   Walk 50 feet activity   Assist    Assist level: Moderate Assistance - Patient - 50 - 74% Assistive device: Walker-rolling    Walk 150 feet activity   Assist Walk 150 feet activity did not occur: Safety/medical concerns         Walk 10 feet on uneven surface  activity   Assist Walk 10 feet on uneven surfaces activity did not occur: Safety/medical concerns         Wheelchair     Assist Is the patient using a wheelchair?: Yes Type of Wheelchair: Manual    Wheelchair assist level: Dependent - Patient 0%      Wheelchair 50 feet with 2 turns activity    Assist        Assist Level: Dependent - Patient 0%   Wheelchair 150 feet activity     Assist      Assist Level: Dependent - Patient 0%   Blood pressure (!) 105/54, pulse 90, temperature 98.3 F (36.8 C), resp. rate 16, height 5\' 2"  (1.575 m), weight 93.2 kg, SpO2 96%.   Medical Problem List and Plan: 1. 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  Antithrombotics: -DVT/anticoagulation:  Pharmaceutical: Lovenox added             -antiplatelet therapy: DAPT X 3 weeks followed by ASA alone 3. Pain Management:  N/A 4. Mood/Behavior/Sleep: LCSW to follow for evaluation and support             -antipsychotic agents: N/A 5. Neuropsych/cognition: This patient is capable of making decisions on her own behalf. 6. Skin/Wound Care: Routine pressure relief measures.  7. Fluids/Electrolytes/Nutrition: Monitor I/O. Check CMET in am.  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 On Tresiba 16 units w/ Novolog 20 units TID ac/ Endo @ [facility]             --Continue Insulin gargline 30 units with  15 units TID ac --Monitor BS ac/hs and continue to educate on compliance, risk factors/benefits. She didn't take insulin consistently and does not wear CGM due to "excessive beeping" 8/27- CBGs 195-221- will increase Semglee to 33 units  daily- first dose in AM 8/28- CBGs 182 this AM- but hasn't started new dosing of Semglee- will monitor for trend.  8/29- CBGs 160-278- worse this AM- will increase Semglee to 37 units in AM- won't get until tomorrow AM 9. HTN: Monitor BP TID- continue to hold Lisinopril and hydrochlorothiazide.  Long-term goal normotensive --BP controlled currently  10. Hyponatremia: Question chronic @131 -132 range per chart review             --recheck BMET in am   8/27- Na 135 11. Hyperlipidemia: Trig-245/LDL-180. Now on increased dose Crestor 40 mg.  Dietary education 12. Hypomagnesemia: Recheck in am. Now on Mag ox daily. 8/27- Will check Mg in AM  8/28- mg hasn't bene done- not clear if pt refused labs or what.  8/29- Mg up to 2.0- came back at 18:56am 13. Chronic leucocytosis: Trend WBC and monitor for signs of infection.              --improved from 13.4 to 11.9  8/27- will check U/ And Cx and CXR because up to 16k  8/28- U/ A negative- has large bacteria, which could be colonization but otherwise negative- CXR negative.   8/29- feels well- will recheck in AM just to monitor and weekly 14. Morbid obesity: BMI- 39.4. CM/HH diet. RD consulted to review diet.  --Educate patient on importance of weight loss and exercise to help promote health, improve AIC and mobility.  15. Constipation: Declines need for laxative. Advised that prns on board. Monitor for now.  8/27- changed/added Senna-S 1 tab at bedtime-  8/28- small BM yesterday AM- will monitor and if doesn't go by tomorrow, will give sorbitol.  8/29- had medium BM yesterday   I spent a total of 39   minutes on total care today- >50% coordination of care- due to  IPOC and d/w staff about FMLA paperwork father is  bringing in- d/w SW Also titration of CBGs and monitoring of Leukocytosis.   LOS: 3 days A FACE TO FACE EVALUATION WAS PERFORMED  Ferdinand Revoir 12/27/2022, 9:02 AM

## 2022-12-27 NOTE — Progress Notes (Addendum)
Patient ID: Lisa Werner, female   DOB: 11-09-1980, 42 y.o.   MRN: [id number]  SW received FMLA forms and provided forms to PA.   SW made efforts to make contact with pt father Lisa Werner but unable to contact. SW will follow-up with pt again about correct contact in computer.   Cecile Sheerer, MSW, LCSWA Office: [phone] Cell: [phone] Fax: [phone]

## 2022-12-27 NOTE — Progress Notes (Signed)
Met with patient.  Lying in bed with back facing door.  Didn't change positions during conversation.  Very flat affect. Reports that was taking insulin at home and checking her CBG 3 or 4 times a day.  A1C was 15.5. Asked if she was taking her insulin, reports that sometimes she would and sometimes she didn't. Must improve otherwise increases her risk for another stroke and other complications. Living Well With Diabetes in room, placed in binder.  Patient not engaged in conversation. Informed of team conference every Tuesday and SW will follow up.  Will try and speak to her again.

## 2022-12-27 NOTE — Progress Notes (Signed)
Occupational Therapy Session Note  Patient Details  Name: Lisa Werner MRN: [id number] Date of Birth: Dec 26, 1980  Today's Date: 12/27/2022 OT Missed Time: 60 Minutes Missed Time Reason: Patient fatigue;Patient unwilling/refused to participate without medical reason (Pt deply seeling and too fatigued for group therapy session)   Short Term Goals: Week 1:  OT Short Term Goal 1 (Week 1): Pt will complete LB dressing Min A with AE as necessary OT Short Term Goal 2 (Week 1): Pt will complete toilet transfer CGA with only occasional cues for safety awareness  Skilled Therapeutic Interventions/Progress Updates:     Pt deeply sleeping upon OT arrival and too fatigued to participate in group therapy session. Pt politely refusing to participate in session and requesting to rest despite maximal therapeutic support provided. Missed 60 minutes of skilled OT treatment d/t fatigue.   Therapy Documentation Precautions:  Precautions Precautions: Fall Precaution Comments: R hemi, inattention Restrictions Weight Bearing Restrictions: No  Therapy/Group: Group Therapy  Tollie Pizza Woodson 12/27/2022, 4:20 PM

## 2022-12-27 NOTE — Progress Notes (Signed)
Occupational Therapy Session Note  Patient Details  Name: Lisa Werner MRN: [id number] Date of Birth: 05-05-1980  Today's Date: 12/28/2022 OT Individual Time: 0805-0900 OT Individual Time Calculation (min): 55 min    Short Term Goals: Week 1:  OT Short Term Goal 1 (Week 1): Pt will complete LB dressing Min A with AE as necessary OT Short Term Goal 2 (Week 1): Pt will complete toilet transfer CGA with only occasional cues for safety awareness  Skilled Therapeutic Interventions/Progress Updates:    Patient agreeable to participate in OT session. Reports 0/10  pain level.     Bed mobility: Pt completed at Independent level. HOB flat and no use of bed rails.  Functional mobility: Utilizing RW, pt navigated from bed to walk-in shower, to standard chair, and also to recliner during session with CGA. VC provided for RW management and keeping RW at a safe distance.   UB bathing: Pt complete while seated requiring Min A.   LB bathing: Pt complete while seated and standing requiring Min A to reach feet and lower legs. VC provided for safety during standing.  UB dressing: Pt complete while seated requiring Min A to clasp and manage bra and sports bra. Pt educated on adaptive bras available online through 3 separate websites. Suggested purchasing one with a velcro or magnetic front closure if interested.   LB dressing: Pt complete while sitting and then standing requiring Mod A. Pt educated on hemi dressing technique. VC provided for sequencing. Assist primarily required for pad management in mesh underwear, set-up of sock on sock aid, and pulling pants/underwear up over hips on right side.  Grooming: Pt complete while seated requiring set-up.    Therapy Documentation Precautions:  Precautions Precautions: Fall Precaution Comments: R hemi, inattention Restrictions Weight Bearing Restrictions: No  Therapy/Group: Individual Therapy  Limmie Patricia, OTR/L,CBIS   Supplemental OT - [facility] and [facility] Secure Chat Preferred   12/28/2022, 7:53 AM

## 2022-12-27 NOTE — Progress Notes (Signed)
Speech Language Pathology Daily Session Note  Patient Details  Name: Lisa Werner MRN: [id number] Date of Birth: [birth year]  Today's Date: 12/27/2022 SLP Individual Time: 1000-1100 SLP Individual Time Calculation (min): 60 min  Short Term Goals: Week 1: SLP Short Term Goal 1 (Week 1): Patient will utilize swallowing compensatory strategies during PO consumption to reduce risk of aspiration with min multimodal A SLP Short Term Goal 2 (Week 1): Patient will increase speech intelligibility to 90% at the conversational level with min multimodal A SLP Short Term Goal 3 (Week 1): Patient will demonstate problem solving abilities during complex daily situations with min mulitmodal A SLP Short Term Goal 4 (Week 1): Patient will recall 2 cognitive and 2 physical changes since admission with min mulitmodal A  Skilled Therapeutic Interventions: Skilled therapy session focused on problem solving goals. SLP facilitated session by providing minA during money manipulation activity. Patient able to count money requested by SLP in 80% of opportunities with minA. Upon completion of money manipulation activity, patient completed functional mathematical calculations. Patient required minA during addition, though benefited from increased cues during subtraction. During session, patient requested SLP aid in completion of extended leave form for work due to poor writing ability. Patient able to recall biographical information and answer questions independently. Patient left in chair with alarm set and call bell in reach. Continue POC  Pain Pain Assessment Pain Scale: 0-10 Pain Score: 0-No pain  Therapy/Group: Individual Therapy  Latima Hamza M.A., CF-SLP 12/27/2022, 12:39 PM

## 2022-12-27 NOTE — IPOC Note (Signed)
Overall Plan of Care [facility]) Patient Details Name: Lisa Werner MRN: [id number] DOB: 03/04/1981  Admitting Diagnosis: Left pontine stroke [facility])  Hospital Problems: Principal Problem:   Left pontine stroke [facility])     Functional Problem List: Nursing Endurance, Medication Management, Edema, Safety  PT Balance, Safety, Endurance, Motor, Sensory  OT Balance, Cognition, Endurance, Motor, Safety  SLP Cognition  TR         Basic ADL's: OT Grooming, Bathing, Dressing, Toileting     Advanced  ADL's: OT Light Housekeeping     Transfers: PT Car, Bed to Chair, Bed Mobility  OT Toilet, Tub/Shower     Locomotion: PT Ambulation, Stairs, Wheelchair Mobility     Additional Impairments: OT Fuctional Use of Upper Extremity  SLP Swallowing, Social Cognition   Problem Solving, Awareness  TR      Anticipated Outcomes Item Anticipated Outcome  Self Feeding mod I  Swallowing  modI   Basic self-care  mod I  Toileting  mod I   Bathroom Transfers mod I  Bowel/Bladder  n/a  Transfers  mod I  Locomotion  mod I with LRAD  Communication     Cognition  modI  Pain  n/a  Safety/Judgment  manage w cues   Therapy Plan: PT Intensity: Minimum of 1-2 x/day ,45 to 90 minutes PT Frequency: 5 out of 7 days PT Duration Estimated Length of Stay: 12-16 days OT Intensity: Minimum of 1-2 x/day, 45 to 90 minutes OT Frequency: 5 out of 7 days OT Duration/Estimated Length of Stay: 12-16 days SLP Intensity: Minumum of 1-2 x/day, 30 to 90 minutes SLP Frequency: 1 to 3 out of 7 days SLP Duration/Estimated Length of Stay: 12-14 days   Team Interventions: Nursing Interventions Patient/Family Education, Disease Management/Prevention, Medication Management, Discharge Planning  PT interventions Ambulation/gait training, DME/adaptive equipment instruction, Discharge planning, Functional mobility training, Psychosocial support, Splinting/orthotics, Therapeutic Activities, UE/LE Strength  taining/ROM, Visual/perceptual remediation/compensation, Warden/ranger, Community reintegration, Disease management/prevention, Development worker, international aid stimulation, Patient/family education, Neuromuscular re-education, Museum/gallery curator, Therapeutic Exercise, UE/LE Coordination activities, Wheelchair propulsion/positioning  OT Interventions Warden/ranger, Discharge planning, Self Care/advanced ADL retraining, Therapeutic Activities, UE/LE Coordination activities, Cognitive remediation/compensation, Functional mobility training, Patient/family education, Therapeutic Exercise, Community reintegration, Fish farm manager, Neuromuscular re-education, UE/LE Strength taining/ROM  SLP Interventions Cognitive remediation/compensation, Dysphagia/aspiration precaution training, Internal/external aids, Cueing hierarchy, Therapeutic Activities, Functional tasks, Patient/family education, Therapeutic Exercise  TR Interventions    SW/CM Interventions Discharge Planning, Psychosocial Support, Patient/Family Education   Barriers to Discharge MD  Medical stability, Home enviroment access/loayout, Lack of/limited family support, Weight, and Behavior  Nursing Decreased caregiver support, Home environment access/layout 1 level, flight right rail;works as a Therapist, nutritional; 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 that is related to her (pt's father is taking care of him at present)  PT Decreased caregiver support, Home environment access/layout, Inaccessible home environment, Lack of/limited family support, Incontinence    OT Inaccessible home environment Pt reports ~20 stairs to enter apartment after ambulating through parking lot  SLP      SW Decreased caregiver support, Lack of/limited family support, Community education officer for SNF coverage     Team Discharge Planning: Destination: PT-Home ,OT- Home , SLP-Home Projected Follow-up: PT-Home health PT, OT-  Outpatient OT, SLP-None Projected Equipment  Needs: PT-To be determined, OT- 3 in 1 bedside comode, Rolling walker with 5" wheels, Tub/shower bench, To be determined, SLP-None recommended by SLP Equipment Details: PT- , OT-does not own any DME Patient/family involved in discharge planning: PT- Patient,  OT-Patient, SLP-Patient  MD  ELOS: 12-16 days Medical Rehab Prognosis:  Good Assessment: The patient has been admitted for CIR therapies with the diagnosis of L pontine stroke. The team will be addressing functional mobility, strength, stamina, balance, safety, adaptive techniques and equipment, self-care, bowel and bladder mgt, patient and caregiver education, . Goals have been set at mod I. Anticipated discharge destination is home with parent.        See Team Conference Notes for weekly updates to the plan of care

## 2022-12-27 NOTE — Evaluation (Signed)
Recreational Therapy Assessment and Plan  Patient Details  Name: Lisa Werner MRN: [id number] Date of Birth: 18-Jan-1981 Today's Date: 12/27/2022  Rehab Potential:  Good ELOS:   2 weeks  Assessment  Hospital Problem: Principal Problem:   Left pontine stroke [facility])     Past Medical History:      Past Medical History:  Diagnosis Date   Diabetes mellitus without complication (HCC)     Hyperlipidemia     Hypertension          Past Surgical History:  No past surgical history on file.       Assessment & Plan Clinical Impression: Lisa Werner is a 42 year old RH- female with history of LADA/TIDM, HTN, morbid obesity who was admitted on 12/19/22 with four day history of right sided weakness with difficulty walking. She was sent over from MD office and BS 536 per EMS (had missed am dose insulin). CTA head/neck negative for LVO with area of caliber change L-V2 segment concerning for dissection. [facility] Kitchen MRI brain done revealing acute infarct in left pons and MRA negative for occulusion and 1-2 mm outpouching from right A2 question tiny aneurysm v/s infundibulum. 2D echo showed EF 65-70% with moderate LVH, trivial TVR and negative bubble study. Patient reports she had slurred speech initially but this has improved. Neurology feels like her stroke is most likely due to small vessel disease. Dr. Cherie Dark recommended DAPT for stroke due to small vessel disease/uncontrolled risk factors for 3 weeks and then aspirin alone. .  Patient transferred to CIR on 12/24/2022 .     Pt presents with decreased activity tolerance, decreased functional mobility, decreased balance, decreased coordination Limiting pt's independence with leisure/community pursuits.  Met with pt today to discuss TR services including leisure education, activity analysis/modifications and stress management.  Also discussed the importance of social, emotional, spiritual health in addition to physical health and their effects  on overall health and wellness.  Pt stated understanding.  Plan  Min 1 TR session >20 minutes during LOS  Recommendations for other services: Neuropsych  Discharge Criteria: Patient will be discharged from TR if patient refuses treatment 3 consecutive times without medical reason.  If treatment goals not met, if there is a change in medical status, if patient makes no progress towards goals or if patient is discharged from hospital.  The above assessment, treatment plan, treatment alternatives and goals were discussed and mutually agreed upon: by patient  [name] 12/27/2022, 12:58 PM

## 2022-12-28 DIAGNOSIS — I639 Cerebral infarction, unspecified: Secondary | ICD-10-CM | POA: Diagnosis not present

## 2022-12-28 LAB — CBC WITH DIFFERENTIAL/PLATELET
Abs Immature Granulocytes: 0.1 10*3/uL — ABNORMAL HIGH (ref 0.00–0.07)
Basophils Absolute: 0.1 10*3/uL (ref 0.0–0.1)
Basophils Relative: 1 %
Eosinophils Absolute: 0.6 10*3/uL — ABNORMAL HIGH (ref 0.0–0.5)
Eosinophils Relative: 5 %
HCT: 39.2 % (ref 36.0–46.0)
Hemoglobin: 12.7 g/dL (ref 12.0–15.0)
Immature Granulocytes: 1 %
Lymphocytes Relative: 21 %
Lymphs Abs: 2.6 10*3/uL (ref 0.7–4.0)
MCH: 28.7 pg (ref 26.0–34.0)
MCHC: 32.4 g/dL (ref 30.0–36.0)
MCV: 88.5 fL (ref 80.0–100.0)
Monocytes Absolute: 0.8 10*3/uL (ref 0.1–1.0)
Monocytes Relative: 6 %
Neutro Abs: 8.3 10*3/uL — ABNORMAL HIGH (ref 1.7–7.7)
Neutrophils Relative %: 66 %
Platelets: 294 10*3/uL (ref 150–400)
RBC: 4.43 MIL/uL (ref 3.87–5.11)
RDW: 13.2 % (ref 11.5–15.5)
WBC: 12.4 10*3/uL — ABNORMAL HIGH (ref 4.0–10.5)
nRBC: 0 % (ref 0.0–0.2)

## 2022-12-28 LAB — PROTHROMBIN GENE MUTATION

## 2022-12-28 LAB — GLUCOSE, CAPILLARY
Glucose-Capillary: 208 mg/dL — ABNORMAL HIGH (ref 70–99)
Glucose-Capillary: 242 mg/dL — ABNORMAL HIGH (ref 70–99)
Glucose-Capillary: 232 mg/dL — ABNORMAL HIGH (ref 70–99)
Glucose-Capillary: 285 mg/dL — ABNORMAL HIGH (ref 70–99)

## 2022-12-28 NOTE — Progress Notes (Signed)
PROGRESS NOTE   Subjective/Complaints:  Pt reports no Issues CBGs are still elevated per nursing.   ROS:  Pt denies SOB, abd pain, CP, N/V/C/D, and vision changes   Except for HPI Objective:   No results found. Recent Labs    12/28/22 0726  WBC 12.4*  HGB 12.7  HCT 39.2  PLT 294   No results for input(s): "NA", "K", "CL", "CO2", "GLUCOSE", "BUN", "CREATININE", "CALCIUM" in the last 72 hours.   Intake/Output Summary (Last 24 hours) at 12/28/2022 1806 Last data filed at 12/28/2022 1545 Gross per 24 hour  Intake 720 ml  Output 200 ml  Net 520 ml        Physical Exam: Vital Signs Blood pressure 138/83, pulse 99, temperature 98.8 F (37.1 C), temperature source Oral, resp. rate 16, height 5\' 2"  (1.575 m), weight 93.2 kg, SpO2 96%.       General: awake, alert, appropriate, sitting EOB eating breakfast; NAD HENT: conjugate gaze; oropharynx moist CV: regular rate; no JVD Pulmonary: CTA B/L; no W/R/R- good air movement GI: soft, NT, ND, (+)BS Psychiatric: appropriate- very flat Neurological: Ox3- but slightly delayed  Neuro: Alert and oriented x 3, she is able to provide a clear and coherent history, follows commands, cranial nerves II through XII intact besides shoulder shrug is a little decreased on the right, able to name and repeat, no dysarthria or aphasia noted RUE: 4-/5 Deltoid, 4-/5 Biceps, 4-/5 Triceps,  4-/5 Grip LUE: 5/5 Deltoid, 5/5 Biceps, 5/5 Triceps, 5/5 Wrist Ext, 5/5 Grip RLE: HF 4-/5, KE 4-/5, ADF 4-/5, APF 4-/5 LLE: HF 5/5, KE 5/5, ADF 5/5, APF 5/5 Sensory exam normal for light touch and pain in all 4 limbs.  No abnormal tone appreciated.   Assessment/Plan: 1. Functional deficits which require 3+ hours per day of interdisciplinary therapy in a comprehensive inpatient rehab setting. Physiatrist is providing close team supervision and 24 hour management of active medical problems listed  below. Physiatrist and rehab team continue to assess barriers to discharge/monitor patient progress toward functional and medical goals  Care Tool:  Bathing    Body parts bathed by patient: Right arm, Left arm, Chest, Front perineal area, Abdomen, Face, Right upper leg, Left upper leg   Body parts bathed by helper: Buttocks, Left lower leg, Right lower leg     Bathing assist Assist Level: Minimal Assistance - Patient > 75%     Upper Body Dressing/Undressing Upper body dressing   What is the patient wearing?: Bra, Pull over shirt    Upper body assist Assist Level: Minimal Assistance - Patient > 75%    Lower Body Dressing/Undressing Lower body dressing      What is the patient wearing?: Underwear/pull up, Pants     Lower body assist Assist for lower body dressing: Moderate Assistance - Patient 50 - 74%     Toileting Toileting    Toileting assist Assist for toileting: Contact Guard/Touching assist     Transfers Chair/bed transfer  Transfers assist     Chair/bed transfer assist level: Minimal Assistance - Patient > 75%     Locomotion Ambulation   Ambulation assist      Assist level: Moderate Assistance -  Patient 50 - 74% Assistive device: Walker-rolling Max distance: 110   Walk 10 feet activity   Assist     Assist level: Minimal Assistance - Patient > 75% Assistive device: Walker-rolling   Walk 50 feet activity   Assist    Assist level: Moderate Assistance - Patient - 50 - 74% Assistive device: Walker-rolling    Walk 150 feet activity   Assist Walk 150 feet activity did not occur: Safety/medical concerns         Walk 10 feet on uneven surface  activity   Assist Walk 10 feet on uneven surfaces activity did not occur: Safety/medical concerns         Wheelchair     Assist Is the patient using a wheelchair?: Yes Type of Wheelchair: Manual    Wheelchair assist level: Dependent - Patient 0%      Wheelchair 50 feet with  2 turns activity    Assist        Assist Level: Dependent - Patient 0%   Wheelchair 150 feet activity     Assist      Assist Level: Dependent - Patient 0%   Blood pressure 138/83, pulse 99, temperature 98.8 F (37.1 C), temperature source Oral, resp. rate 16, height 5\' 2"  (1.575 m), weight 93.2 kg, SpO2 96%.   Medical Problem List and Plan: 1. 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  Antithrombotics: -DVT/anticoagulation:  Pharmaceutical: Lovenox added             -antiplatelet therapy: DAPT X 3 weeks followed by ASA alone 3. Pain Management:  N/A 4. Mood/Behavior/Sleep: LCSW to follow for evaluation and support             -antipsychotic agents: N/A 5. Neuropsych/cognition: This patient is capable of making decisions on her own behalf. 6. Skin/Wound Care: Routine pressure relief measures.  7. Fluids/Electrolytes/Nutrition: Monitor I/O. Check CMET in am.  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 On Tresiba 16 units w/ Novolog 20 units TID ac/ Endo @ [facility]             --Continue Insulin gargline 30 units with 15 units TID ac --Monitor BS ac/hs and continue to educate on compliance, risk factors/benefits. She didn't take insulin consistently and does not wear CGM due to "excessive beeping" 8/27- CBGs 195-221- will increase Semglee to 33 units daily- first dose in AM 8/28- CBGs 182 this AM- but hasn't started new dosing of Semglee- will monitor for trend.  8/29- CBGs 160-278- worse this AM- will increase Semglee to 37 units in AM- won't get until tomorrow AM 8/30- CBGs better today- in low to mid 200s- down from 300s- con't regimen but will need changes this weekend 9. HTN: Monitor BP TID- continue to hold Lisinopril  and hydrochlorothiazide.  Long-term goal normotensive --BP controlled currently  10. Hyponatremia: Question chronic @131 -132 range per chart review             --recheck BMET in am   8/27- Na 135 11. Hyperlipidemia: Trig-245/LDL-180. Now on increased dose Crestor  40 mg.  Dietary education 12. Hypomagnesemia: Recheck in am. Now on Mag ox daily. 8/27- Will check Mg in AM  8/28- mg hasn't bene done- not clear if pt refused labs or what.  8/29- Mg up to 2.0- came back at 18:56am 13. Chronic leucocytosis: Trend WBC and monitor for signs of infection.              --improved from 13.4 to 11.9  8/27- will check U/ And Cx and CXR because up to 16k  8/28- U/ A negative- has large bacteria, which could be colonization but otherwise negative- CXR negative.   8/29- feels well- will recheck in AM just to monitor and weekly  8/30- down to 12.4 14. Morbid obesity: BMI- 39.4. CM/HH diet. RD consulted to review diet.  --Educate patient on importance of weight loss and exercise to help promote health, improve AIC and mobility.  15. Constipation: Declines need for laxative. Advised that prns on board. Monitor for now.  8/27- changed/added Senna-S 1 tab at bedtime-  8/28- small BM yesterday AM- will monitor and if doesn't go by tomorrow, will give sorbitol.  8/29- had medium BM yesterday     LOS: 4 days A FACE TO FACE EVALUATION WAS PERFORMED  Lisa Werner 12/28/2022, 6:06 PM

## 2022-12-28 NOTE — Progress Notes (Signed)
Speech Language Pathology Daily Session Note  Patient Details  Name: Lisa Werner MRN: [id number] Date of Birth: 01/08/1981  Today's Date: 12/28/2022 SLP Individual Time: 1300-1400 SLP Individual Time Calculation (min): 60 min  Short Term Goals: Week 1: SLP Short Term Goal 1 (Week 1): Patient will utilize swallowing compensatory strategies during PO consumption to reduce risk of aspiration with min multimodal A SLP Short Term Goal 2 (Week 1): Patient will increase speech intelligibility to 90% at the conversational level with min multimodal A SLP Short Term Goal 3 (Week 1): Patient will demonstate problem solving abilities during complex daily situations with min mulitmodal A SLP Short Term Goal 4 (Week 1): Patient will recall 2 cognitive and 2 physical changes since admission with min mulitmodal A  Skilled Therapeutic Interventions: Skilled therapy session focused on cognitive goals. SLP facilitated session by providing minA during vacation budgeting task. Patient was asked to create a budget for two chosen vacations. Patient able to identify appropriate budget, items that should be included in the budget, and calculate total at the conclusion of activity. Patient seemed brighter today in comparison to prior. Patient left in bed, with call bell in hand and alarm set. Continue POC.  Pain Pain Assessment Pain Scale: 0-10 Pain Score: 0-No pain  Therapy/Group: Individual Therapy  Kaston Faughn M.A., CF-SLP 12/28/2022, 3:59 PM

## 2022-12-28 NOTE — Plan of Care (Signed)
  Problem: Consults Goal: RH STROKE PATIENT EDUCATION Description: See Patient Education module for education specifics  Outcome: Progressing   Problem: RH SAFETY Goal: RH STG ADHERE TO SAFETY PRECAUTIONS W/ASSISTANCE/DEVICE Description: STG Adhere to Safety Precautions With cues Assistance/Device. Outcome: Progressing   Problem: RH KNOWLEDGE DEFICIT Goal: RH STG INCREASE KNOWLEDGE OF DIABETES Description: Patient will be able to manage DM with medication and dietary modification using educational resources independently Outcome: Progressing Goal: RH STG INCREASE KNOWLEDGE OF HYPERTENSION Description: Patient will be able to manage HTN with medication and dietary modification using educational resources independently Outcome: Progressing Goal: RH STG INCREASE KNOWLEGDE OF HYPERLIPIDEMIA Description: Patient will be able to manage HLD with medication and dietary modification using educational resources independently Outcome: Progressing Goal: RH STG INCREASE KNOWLEDGE OF STROKE PROPHYLAXIS Description: Patient will be able to manage secondary stroke risks with medication and dietary modification using educational resources independently Outcome: Progressing   Problem: Education: Goal: Knowledge of disease or condition will improve Outcome: Progressing Goal: Knowledge of secondary prevention will improve (MUST DOCUMENT ALL) Outcome: Progressing Goal: Knowledge of patient specific risk factors will improve Loraine Leriche N/A or DELETE if not current risk factor) Outcome: Progressing   Problem: Ischemic Stroke/TIA Tissue Perfusion: Goal: Complications of ischemic stroke/TIA will be minimized Outcome: Progressing   Problem: Coping: Goal: Will verbalize positive feelings about self Outcome: Progressing Goal: Will identify appropriate support needs Outcome: Progressing   Problem: Health Behavior/Discharge Planning: Goal: Ability to manage health-related needs will improve Outcome:  Progressing Goal: Goals will be collaboratively established with patient/family Outcome: Progressing   Problem: Self-Care: Goal: Ability to participate in self-care as condition permits will improve Outcome: Progressing Goal: Verbalization of feelings and concerns over difficulty with self-care will improve Outcome: Progressing Goal: Ability to communicate needs accurately will improve Outcome: Progressing   Problem: Nutrition: Goal: Risk of aspiration will decrease Outcome: Progressing Goal: Dietary intake will improve Outcome: Progressing

## 2022-12-28 NOTE — Progress Notes (Incomplete)
Patient ID: Sula Tibbetts, female   DOB: 12-20-1980, 42 y.o.   MRN: [id number]  SW met with pt in room to provide updates from team conference, and d/c date 9/11. SW discussed best number for her father. Pt provided. She admits to being depressed as she would like to go home. She does not want any medication to help manage medication. She is aware SW will follow-up with her father.   1407-SW spoke with [name]. He reports pt can come to his home as his wife will be at the home and can supervise but she is unable to provide any physical assistance since she is in kidney failure and will begin dialysis soon. He is primarily concerned about if pt goes to  her home--there are 18 stairs to get to her second floor condo. He states his home is better.  States his wife will be at the home and can assist. He reports he works Tues- Sun 8am-5pm. He will come in for family edu on Monday 9am-12pm.   Cecile Sheerer, MSW, LCSWA Office: [phone] Cell: [phone] Fax: [phone]

## 2022-12-28 NOTE — Progress Notes (Signed)
Physical Therapy Session Note  Patient Details  Name: Lisa Werner MRN: [id number] Date of Birth: 06/30/1980  Today's Date: 12/28/2022 PT Individual Time: 0950-1100 PT Individual Time Calculation (min): 70 min   Short Term Goals: Week 1:  PT Short Term Goal 1 (Week 1): Pt walks >100 ft with min A or better PT Short Term Goal 2 (Week 1): Pt will maintain RW when turning and walking without cue PT Short Term Goal 3 (Week 1): Pt will ambulate 12 steps with assist  Skilled Therapeutic Interventions/Progress Updates:    Pt received in recliner and agreeable to therapy.  No complaint of pain.   Sit to stand with  RW supervision throughout session  Pt ambulated to main gym, ~150 ft with RW and CGA. Note intermittent circumduction gait, but pt is able to partially correct with cueing. Greatly improved R knee stability.  Pt navigated stairs with RLE ascending despite cueing to ascend with stronger leg.Descending with both LE as well. Demoes good safety and no true buckle but poor eccentric control with RLE and hyperextension when accepting weight.   Transitioned to // bars for curb walking on airex pad, 3 x 3 x 2 BIL. Cueing for knee control, requiring knee guard with fatigue in case of buckling.   Pt then performed TKE in staggered stance with and without RTB resistance for improved knee control in weight bearing. Progressed to backward walking in //bars with cues for step length and mechanics.  Pt then utilized nustep x 8 min on level 4 for reciprocal motion and generalized endurance. Pt returned to room and to bed with CGA Stand pivot transfer, was left with all needs in reach and alarm active.   Therapy Documentation Precautions:  Precautions Precautions: Fall Precaution Comments: R hemi, inattention Restrictions Weight Bearing Restrictions: No General:       Therapy/Group: Individual Therapy  Juluis Rainier 12/28/2022, 10:47 AM

## 2022-12-29 DIAGNOSIS — R739 Hyperglycemia, unspecified: Secondary | ICD-10-CM | POA: Diagnosis not present

## 2022-12-29 DIAGNOSIS — I639 Cerebral infarction, unspecified: Secondary | ICD-10-CM | POA: Diagnosis not present

## 2022-12-29 DIAGNOSIS — I10 Essential (primary) hypertension: Secondary | ICD-10-CM | POA: Diagnosis not present

## 2022-12-29 LAB — GLUCOSE, CAPILLARY
Glucose-Capillary: 224 mg/dL — ABNORMAL HIGH (ref 70–99)
Glucose-Capillary: 256 mg/dL — ABNORMAL HIGH (ref 70–99)
Glucose-Capillary: 285 mg/dL — ABNORMAL HIGH (ref 70–99)
Glucose-Capillary: 272 mg/dL — ABNORMAL HIGH (ref 70–99)

## 2022-12-29 NOTE — Progress Notes (Signed)
Speech Language Pathology Daily Session Note  Patient Details  Name: Lisa Werner MRN: [id number] Date of Birth: 07-Dec-1980  Today's Date: 12/29/2022 SLP Individual Time: 0300-0345 SLP Individual Time Calculation (min): 45 min  Short Term Goals: Week 1: SLP Short Term Goal 1 (Week 1): Patient will utilize swallowing compensatory strategies during PO consumption to reduce risk of aspiration with min multimodal A SLP Short Term Goal 2 (Week 1): Patient will increase speech intelligibility to 90% at the conversational level with min multimodal A SLP Short Term Goal 3 (Week 1): Patient will demonstate problem solving abilities during complex daily situations with min mulitmodal A SLP Short Term Goal 4 (Week 1): Patient will recall 2 cognitive and 2 physical changes since admission with min mulitmodal A  Skilled Therapeutic Interventions:  Pt seen for skilled ST targeting cognition goals. Pt asleep in bed upon SLP arrival but awoke easily to verbal stimuli. Denied pain.   SLP facilitated functional addition tasks using a menu to target problem solving and alternating attention. When read an order, pt required repetition to recall it and located the item with 70% acc independently, improved to 100% acc when provided min-mod verbal and visual cueing. Written addition was attempted but due to changes in pt's handwriting s/p CVA, it was discontinued and pt used the calculator on her phone instead. Pt had great difficulty initially using her calculator and was unaware of errors, such as entering in 75 rather than 0.75 or neglecting to use the addition sign. She required verbal sequencing from the SLP to input amounts correctly. She had some trouble alternating attention between the menu and her calculator. Pt requested to use the restroom and SLP provided asst - pt followed directions with 100% acc and displayed appropriate safety during this task.  Pt left in bed with call bell in reach and bed  alarm activated. Continue ST POC.   Pain Pain Assessment Pain Scale: 0-10 Pain Score: 0-No pain  Therapy/Group: Individual Therapy  Alphonsus Sias 12/29/2022, 4:34 PM

## 2022-12-29 NOTE — Progress Notes (Signed)
Physical Therapy Session Note  Patient Details  Name: Lisa Werner MRN: [id number] Date of Birth: 01-Oct-1980  Today's Date: 12/29/2022 PT Individual Time: 5784-6962 PT Individual Time Calculation (min): 42 min   Short Term Goals: Week 1:  PT Short Term Goal 1 (Week 1): Pt walks >100 ft with min A or better PT Short Term Goal 2 (Week 1): Pt will maintain RW when turning and walking without cue PT Short Term Goal 3 (Week 1): Pt will ambulate 12 steps with assist  Skilled Therapeutic Interventions/Progress Updates:      Pt lying in bed and reporting no pain. Agreeable to therapy treatment.   Bed mobility completed with supervision without hospital bed features. Donned tennis shoes with assist for time management. Sit<>stand with CGA and no AD from EOB and pt ambulated from her room all the way downstairs (via elevator) outside near [facility] with minA and no AD, distances >235ft. While outdoors, addressed unlevel gait training and community mobility training with minA and no AD on sloped and uneven terrain. R knee buckling on occasion which requires assist for correction. Stair training outdoors with 1 hand rail on her stronger L side and she navigated 4 +4 steps at minA level with pt self selecting to reciprocal step for ascent/descent and she was educated on safety awareness to reduce falls risk. While outdoors, we also discussed CVA stroke support group, peer support program, follow therapies, general CVA progressions, etc - pt receptive to all education and feedback. She ambulated back upstairs to CIR floor with minA and no AD, similar distances as above. Concluded session in her room with bed alarm on and all needs met. Noted increased R knee instability with fatigue and as session progressed - fair safety awareness as she requests to sit at times due to fatigue.    Therapy Documentation Precautions:  Precautions Precautions: Fall Precaution Comments: R hemi,  inattention Restrictions Weight Bearing Restrictions: No General:    Therapy/Group: Individual Therapy  Nimco Bivens P Lien Lyman  PT, DPT, CSRS  12/29/2022, 7:51 AM

## 2022-12-29 NOTE — Progress Notes (Signed)
PROGRESS NOTE   Subjective/Complaints:  Pt states she's doing ok, slept ok, denies pain, LBM today, urinating fine, denies any other complaints or concerns.   ROS:  Pt denies SOB, abd pain, CP, N/V/C/D, and vision changes   Except for HPI   Objective:   No results found. Recent Labs    12/28/22 0726  WBC 12.4*  HGB 12.7  HCT 39.2  PLT 294   No results for input(s): "NA", "K", "CL", "CO2", "GLUCOSE", "BUN", "CREATININE", "CALCIUM" in the last 72 hours.   Intake/Output Summary (Last 24 hours) at 12/29/2022 1102 Last data filed at 12/28/2022 1823 Gross per 24 hour  Intake 720 ml  Output --  Net 720 ml        Physical Exam: Vital Signs Blood pressure 118/88, pulse 95, temperature 98.1 F (36.7 C), temperature source Oral, resp. rate 18, height 5\' 2"  (1.575 m), weight 93.8 kg, SpO2 99%.   General: awake, alert, appropriate, sitting in chair watching TV; NAD HENT: conjugate gaze; oropharynx moist CV: regular rate; no JVD Pulmonary: CTA B/L; no W/R/R- good air movement GI: soft, NT, ND, (+)BS Psychiatric: appropriate- very flat Neurological: Ox3- but slightly delayed  PRIOR EXAMS: Neuro: Alert and oriented x 3, she is able to provide a clear and coherent history, follows commands, cranial nerves II through XII intact besides shoulder shrug is a little decreased on the right, able to name and repeat, no dysarthria or aphasia noted RUE: 4-/5 Deltoid, 4-/5 Biceps, 4-/5 Triceps,  4-/5 Grip LUE: 5/5 Deltoid, 5/5 Biceps, 5/5 Triceps, 5/5 Wrist Ext, 5/5 Grip RLE: HF 4-/5, KE 4-/5, ADF 4-/5, APF 4-/5 LLE: HF 5/5, KE 5/5, ADF 5/5, APF 5/5 Sensory exam normal for light touch and pain in all 4 limbs.  No abnormal tone appreciated.   Assessment/Plan: 1. Functional deficits which require 3+ hours per day of interdisciplinary therapy in a comprehensive inpatient rehab setting. Physiatrist is providing close team  supervision and 24 hour management of active medical problems listed below. Physiatrist and rehab team continue to assess barriers to discharge/monitor patient progress toward functional and medical goals  Care Tool:  Bathing    Body parts bathed by patient: Right arm, Left arm, Chest, Front perineal area, Abdomen, Face, Right upper leg, Left upper leg   Body parts bathed by helper: Buttocks, Left lower leg, Right lower leg     Bathing assist Assist Level: Minimal Assistance - Patient > 75%     Upper Body Dressing/Undressing Upper body dressing   What is the patient wearing?: Bra, Pull over shirt    Upper body assist Assist Level: Minimal Assistance - Patient > 75%    Lower Body Dressing/Undressing Lower body dressing      What is the patient wearing?: Underwear/pull up, Pants     Lower body assist Assist for lower body dressing: Moderate Assistance - Patient 50 - 74%     Toileting Toileting    Toileting assist Assist for toileting: Contact Guard/Touching assist     Transfers Chair/bed transfer  Transfers assist     Chair/bed transfer assist level: Minimal Assistance - Patient > 75%     Locomotion Ambulation   Ambulation assist  Assist level: Moderate Assistance - Patient 50 - 74% Assistive device: Walker-rolling Max distance: 110   Walk 10 feet activity   Assist     Assist level: Minimal Assistance - Patient > 75% Assistive device: Walker-rolling   Walk 50 feet activity   Assist    Assist level: Moderate Assistance - Patient - 50 - 74% Assistive device: Walker-rolling    Walk 150 feet activity   Assist Walk 150 feet activity did not occur: Safety/medical concerns         Walk 10 feet on uneven surface  activity   Assist Walk 10 feet on uneven surfaces activity did not occur: Safety/medical concerns         Wheelchair     Assist Is the patient using a wheelchair?: Yes Type of Wheelchair: Manual    Wheelchair  assist level: Dependent - Patient 0%      Wheelchair 50 feet with 2 turns activity    Assist        Assist Level: Dependent - Patient 0%   Wheelchair 150 feet activity     Assist      Assist Level: Dependent - Patient 0%   Blood pressure 118/88, pulse 95, temperature 98.1 F (36.7 C), temperature source Oral, resp. rate 18, height 5\' 2"  (1.575 m), weight 93.8 kg, SpO2 99%.   Medical Problem List and Plan: 1. 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  Antithrombotics: -DVT/anticoagulation:  Pharmaceutical: Lovenox 40mg  daily             -antiplatelet therapy: DAPT X 3 weeks followed by ASA alone 3. Pain Management:  N/A 4. Mood/Behavior/Sleep: LCSW to follow for evaluation and support             -antipsychotic agents: N/A  -Melatonin PRN 5. Neuropsych/cognition: This patient is capable of making decisions on her own behalf. 6. Skin/Wound Care: Routine pressure relief measures.  7. Fluids/Electrolytes/Nutrition: Monitor I/O. Check CMET in am.  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 On Tresiba 16 units w/ Novolog 20 units TID ac/ Endo @ [facility]             --Continue Insulin gargline 30 units with 15 units TID ac --Monitor BS ac/hs and continue to educate on compliance, risk factors/benefits. She didn't take insulin consistently and does not wear CGM due to "excessive beeping" -8/27- CBGs 195-221- will increase Semglee to 33 units daily- first dose in AM -8/28- CBGs 182 this AM- but hasn't started new dosing of Semglee- will monitor for trend.  -8/29- CBGs 160-278- worse this AM- will increase Semglee to 37 units in AM- won't get until tomorrow AM -8/30- CBGs better today- in low to mid 200s- down from 300s- con't regimen but will need  changes this weekend -12/29/22 CBGs 220s-280s, overall better, just started increased dose yesterday; would monitor for trend before more adjustments made  CBG (last 3)  Recent Labs    12/28/22 1625 12/28/22 2112 12/29/22 0604  GLUCAP 232* 285* 224*    9. HTN: Monitor BP TID- continue to hold Lisinopril and hydrochlorothiazide.  Long-term goal normotensive --BP controlled currently  -12/29/22 BPs  great! Monitor  Vitals:   12/25/22 1931 12/26/22 0443 12/26/22 1521 12/26/22 1931  BP: 116/83 120/82 95/67 115/66   12/27/22 0407 12/27/22 1527 12/27/22 1948 12/28/22 0617  BP: (!) 105/54 109/84 122/77 115/85   12/28/22 1404 12/28/22 1923 12/29/22 0338 12/29/22 0343  BP: 138/83 110/76 (P) 118/88 118/88    10. Hyponatremia: Question chronic @131 -132 range per chart review             --recheck BMET in am   8/27- Na 135 11. Hyperlipidemia: Trig-245/LDL-180. Now on increased dose Crestor 40 mg.  Dietary education 12. Hypomagnesemia: Recheck in am. Now on Mag ox 400mg  daily. 8/27- Will check Mg in AM  8/28- mg hasn't bene done- not clear if pt refused labs or what.  8/29- Mg up to 2.0- came back at 18:56am 13. Chronic leucocytosis: Trend WBC and monitor for signs of infection.              --improved from 13.4 to 11.9  8/27- will check U/ And Cx and CXR because up to 16k 8/28- U/ A negative- has large bacteria, which could be colonization but otherwise negative- CXR negative.   8/29- feels well- will recheck in AM just to monitor and weekly  8/30- down to 12.4 14. Morbid obesity: BMI- 39.4. CM/HH diet. RD consulted to review diet.  --Educate patient on importance of weight loss and exercise to help promote health, improve AIC and mobility.  15. Constipation: Declines need for laxative. Advised that prns on board. Monitor for now.  8/27- changed/added Senna-S 1 tab at bedtime-  8/28- small BM yesterday AM- will monitor and if doesn't go by tomorrow, will give sorbitol.  8/29- had medium BM  yesterday -12/29/22 LBM this morning, monitor     LOS: 5 days A FACE TO FACE EVALUATION WAS PERFORMED  [address] [place] [address] 12/29/2022, 11:02 AM

## 2022-12-29 NOTE — Progress Notes (Signed)
Physical Therapy Session Note  Patient Details  Name: Lisa Werner MRN: [id number] Date of Birth: 06-Jul-1980  Today's Date: 12/29/2022 PT Individual Time: 0800-0900 PT Individual Time Calculation (min): 60 min   Short Term Goals: Week 1:  PT Short Term Goal 1 (Week 1): Pt walks >100 ft with min A or better PT Short Term Goal 2 (Week 1): Pt will maintain RW when turning and walking without cue PT Short Term Goal 3 (Week 1): Pt will ambulate 12 steps with assist  Skilled Therapeutic Interventions/Progress Updates:   Pt received lying in bed awake. No complaints of pain. Reports feeling tired. Agreeable to PT tx. Supine to sit MI; STS EOB SBA to RW  Gait training: GT x 78 ft RW CGA with PT verbal cues to decrease cadence and focus on R LE placement. GT 2x181 ft RW CGA with pt ambulating too fast with RW and max verbal cues for decreasing circumduction R LE, decreasing speed, and proper RW placement. GT x 68 ft x 2 with RW with cueing as above. GT x 70 ft x 1 RW CGA with cues as above.   Therex: NuStep L2 x 8 min bil UE/Les with focus on R grip and R LE quality of movement. Seated unilat and bil LAQ x 20 with focus on R LE control; seated bil hip abduction and adduction x 20; 6 in standing step taps x 25 with RW with vc's for R LE control; seated ball squeeze with red ball with alternating LAQ x 20; Seated QS R with leg positioned level with hip with 1-3 sec hold with slow release; STS x 15 narrow base of support, wide base of support each CGA with concentration on eccentric control and R LE control. Standing narrow base of support UE reaches x 20.     Pt fatigued during and post tx. Left in recliner chair, seat belt alarm on, call bell and all needs within reach. No complaints of pain.   Therapy Documentation Precautions:  Precautions Precautions: Fall Precaution Comments: R hemi, inattention Restrictions Weight Bearing Restrictions: No   Therapy/Group: Individual  Therapy  Luna Fuse 12/29/2022, 7:20 AM

## 2022-12-29 NOTE — Plan of Care (Signed)
  Problem: Consults Goal: RH STROKE PATIENT EDUCATION Description: See Patient Education module for education specifics  Outcome: Progressing   Problem: RH SAFETY Goal: RH STG ADHERE TO SAFETY PRECAUTIONS W/ASSISTANCE/DEVICE Description: STG Adhere to Safety Precautions With cues Assistance/Device. Outcome: Progressing   Problem: RH KNOWLEDGE DEFICIT Goal: RH STG INCREASE KNOWLEDGE OF DIABETES Description: Patient will be able to manage DM with medication and dietary modification using educational resources independently Outcome: Progressing Goal: RH STG INCREASE KNOWLEDGE OF HYPERTENSION Description: Patient will be able to manage HTN with medication and dietary modification using educational resources independently Outcome: Progressing Goal: RH STG INCREASE KNOWLEGDE OF HYPERLIPIDEMIA Description: Patient will be able to manage HLD with medication and dietary modification using educational resources independently Outcome: Progressing Goal: RH STG INCREASE KNOWLEDGE OF STROKE PROPHYLAXIS Description: Patient will be able to manage secondary stroke risks with medication and dietary modification using educational resources independently Outcome: Progressing   Problem: Education: Goal: Knowledge of disease or condition will improve Outcome: Progressing Goal: Knowledge of secondary prevention will improve (MUST DOCUMENT ALL) Outcome: Progressing Goal: Knowledge of patient specific risk factors will improve Loraine Leriche N/A or DELETE if not current risk factor) Outcome: Progressing   Problem: Ischemic Stroke/TIA Tissue Perfusion: Goal: Complications of ischemic stroke/TIA will be minimized Outcome: Progressing   Problem: Coping: Goal: Will verbalize positive feelings about self Outcome: Progressing Goal: Will identify appropriate support needs Outcome: Progressing   Problem: Health Behavior/Discharge Planning: Goal: Ability to manage health-related needs will improve Outcome:  Progressing Goal: Goals will be collaboratively established with patient/family Outcome: Progressing   Problem: Self-Care: Goal: Ability to participate in self-care as condition permits will improve Outcome: Progressing Goal: Verbalization of feelings and concerns over difficulty with self-care will improve Outcome: Progressing Goal: Ability to communicate needs accurately will improve Outcome: Progressing   Problem: Nutrition: Goal: Risk of aspiration will decrease Outcome: Progressing Goal: Dietary intake will improve Outcome: Progressing

## 2022-12-29 NOTE — Progress Notes (Signed)
Occupational Therapy Session Note  Patient Details  Name: Lisa Werner MRN: [id number] Date of Birth: 06/09/1980  Today's Date: 12/29/2022 OT Individual Time: 1120-1200 OT Individual Time Calculation (min): 40 min    Short Term Goals: Week 1:  OT Short Term Goal 1 (Week 1): Pt will complete LB dressing Min A with AE as necessary OT Short Term Goal 2 (Week 1): Pt will complete toilet transfer CGA with only occasional cues for safety awareness  Skilled Therapeutic Interventions/Progress Updates:  Skilled OT intervention completed with focus on ambulatory endurance, dual tasking and RUE NMR. Pt received in Lt side lying with body lying halfway off EOB. No pain reported. Pt reported her call bell didn't work when she called so she took the belt alarm off (which may have been unplugged as it didn't alert nursing) therefore pt transferred herself by scooting to edge of leg rest of recliner > bed. NT notified for need to switch out call bell, but advised pt to avoid transferring herself for fall prevention and showed her the call bell feature on outside of bed if it happens again.  Pt agreeable to session, though took more than reasonable amount of time to initiate. Transitioned to EOB with supervision. Donned tennis shoes with max A for time. CGA sit > stand and ambulatory transfer with RW during session.   Ambulated > ortho gym, with cues needed for Rt hemibody control especially during turn navigation as pt with mild ataxic gait. Seated EOM, pt participated in the following activities to address [facility] needed for independence with BADLs and RUE functional use: -9 hole peg test: Lt (unaffected hand)- 27.24 sec, Rt (affected)- 1:38 min. We reviewed purpose of the assessment and difference between R/L function and ways to improve RUE with items around home like picking up coins, flipping cards, crumpling paper -Small pegboard design- using RUE only; pt with mod difficulty sustaining the pinch  to transfer item, therefore downgraded/transitioned to pt removing with tweezers from board for greater focus on sustained pinch/strength with improvement noted  Ambulated back to room with RW, with dual task incorporated by having pt scan R<>L to identify objects as called by therapist. No formal LOB, however less control on RLE noted with cues needed to correct "sloppy placement."  Pt requested to return to bed. Transitioned EOB > Rt side lying with mod I. Pt remained in Rt side lying, with bed alarm on/activated, and with all needs in reach at end of session.   Therapy Documentation Precautions:  Precautions Precautions: Fall Precaution Comments: R hemi, inattention Restrictions Weight Bearing Restrictions: No    Therapy/Group: Individual Therapy  Melvyn Novas, MS, OTR/L  12/29/2022, 12:12 PM

## 2022-12-30 DIAGNOSIS — R739 Hyperglycemia, unspecified: Secondary | ICD-10-CM | POA: Diagnosis not present

## 2022-12-30 DIAGNOSIS — I639 Cerebral infarction, unspecified: Secondary | ICD-10-CM | POA: Diagnosis not present

## 2022-12-30 DIAGNOSIS — I10 Essential (primary) hypertension: Secondary | ICD-10-CM | POA: Diagnosis not present

## 2022-12-30 LAB — GLUCOSE, CAPILLARY
Glucose-Capillary: 234 mg/dL — ABNORMAL HIGH (ref 70–99)
Glucose-Capillary: 227 mg/dL — ABNORMAL HIGH (ref 70–99)
Glucose-Capillary: 240 mg/dL — ABNORMAL HIGH (ref 70–99)
Glucose-Capillary: 395 mg/dL — ABNORMAL HIGH (ref 70–99)

## 2022-12-30 MED ORDER — INSULIN ASPART 100 UNIT/ML IJ SOLN
18.0000 [IU] | Freq: Three times a day (TID) | INTRAMUSCULAR | Status: DC
  Administered 2022-12-30 – 2023-01-09 (×29): 18 [IU] via SUBCUTANEOUS

## 2022-12-30 NOTE — Plan of Care (Signed)
  Problem: Consults Goal: RH STROKE PATIENT EDUCATION Description: See Patient Education module for education specifics  Outcome: Progressing   Problem: RH SAFETY Goal: RH STG ADHERE TO SAFETY PRECAUTIONS W/ASSISTANCE/DEVICE Description: STG Adhere to Safety Precautions With cues Assistance/Device. Outcome: Progressing   Problem: RH KNOWLEDGE DEFICIT Goal: RH STG INCREASE KNOWLEDGE OF DIABETES Description: Patient will be able to manage DM with medication and dietary modification using educational resources independently Outcome: Progressing Goal: RH STG INCREASE KNOWLEDGE OF HYPERTENSION Description: Patient will be able to manage HTN with medication and dietary modification using educational resources independently Outcome: Progressing Goal: RH STG INCREASE KNOWLEGDE OF HYPERLIPIDEMIA Description: Patient will be able to manage HLD with medication and dietary modification using educational resources independently Outcome: Progressing Goal: RH STG INCREASE KNOWLEDGE OF STROKE PROPHYLAXIS Description: Patient will be able to manage secondary stroke risks with medication and dietary modification using educational resources independently Outcome: Progressing   Problem: Education: Goal: Knowledge of disease or condition will improve Outcome: Progressing Goal: Knowledge of secondary prevention will improve (MUST DOCUMENT ALL) Outcome: Progressing Goal: Knowledge of patient specific risk factors will improve Loraine Leriche N/A or DELETE if not current risk factor) Outcome: Progressing   Problem: Ischemic Stroke/TIA Tissue Perfusion: Goal: Complications of ischemic stroke/TIA will be minimized Outcome: Progressing   Problem: Coping: Goal: Will verbalize positive feelings about self Outcome: Progressing Goal: Will identify appropriate support needs Outcome: Progressing   Problem: Health Behavior/Discharge Planning: Goal: Ability to manage health-related needs will improve Outcome:  Progressing Goal: Goals will be collaboratively established with patient/family Outcome: Progressing   Problem: Self-Care: Goal: Ability to participate in self-care as condition permits will improve Outcome: Progressing Goal: Verbalization of feelings and concerns over difficulty with self-care will improve Outcome: Progressing Goal: Ability to communicate needs accurately will improve Outcome: Progressing   Problem: Nutrition: Goal: Risk of aspiration will decrease Outcome: Progressing Goal: Dietary intake will improve Outcome: Progressing

## 2022-12-30 NOTE — Progress Notes (Signed)
PROGRESS NOTE   Subjective/Complaints:  Pt states she's doing ok again today, slept ok, denies pain, LBM yesterday, urinating fine, denies any other complaints or concerns.   ROS:  Pt denies SOB, abd pain, CP, N/V/C/D, and vision changes   Except for HPI   Objective:   No results found. Recent Labs    12/28/22 0726  WBC 12.4*  HGB 12.7  HCT 39.2  PLT 294   No results for input(s): "NA", "K", "CL", "CO2", "GLUCOSE", "BUN", "CREATININE", "CALCIUM" in the last 72 hours.   Intake/Output Summary (Last 24 hours) at 12/30/2022 1029 Last data filed at 12/30/2022 0900 Gross per 24 hour  Intake 472 ml  Output --  Net 472 ml        Physical Exam: Vital Signs Blood pressure 110/80, pulse 94, temperature 97.7 F (36.5 C), temperature source Oral, resp. rate 18, height 5\' 2"  (1.575 m), weight 93.8 kg, SpO2 98%.   General: awake, alert, appropriate, resting in bed; NAD HENT: conjugate gaze; oropharynx moist CV: regular rate; no JVD Pulmonary: CTA B/L; no W/R/R- good air movement GI: soft, NT, ND, (+)BS Psychiatric: appropriate- very flat Neurological: Ox3- but slightly delayed  PRIOR EXAMS: Neuro: Alert and oriented x 3, she is able to provide a clear and coherent history, follows commands, cranial nerves II through XII intact besides shoulder shrug is a little decreased on the right, able to name and repeat, no dysarthria or aphasia noted RUE: 4-/5 Deltoid, 4-/5 Biceps, 4-/5 Triceps,  4-/5 Grip LUE: 5/5 Deltoid, 5/5 Biceps, 5/5 Triceps, 5/5 Wrist Ext, 5/5 Grip RLE: HF 4-/5, KE 4-/5, ADF 4-/5, APF 4-/5 LLE: HF 5/5, KE 5/5, ADF 5/5, APF 5/5 Sensory exam normal for light touch and pain in all 4 limbs.  No abnormal tone appreciated.   Assessment/Plan: 1. Functional deficits which require 3+ hours per day of interdisciplinary therapy in a comprehensive inpatient rehab setting. Physiatrist is providing close team  supervision and 24 hour management of active medical problems listed below. Physiatrist and rehab team continue to assess barriers to discharge/monitor patient progress toward functional and medical goals  Care Tool:  Bathing    Body parts bathed by patient: Right arm, Left arm, Chest, Front perineal area, Abdomen, Face, Right upper leg, Left upper leg   Body parts bathed by helper: Buttocks, Left lower leg, Right lower leg     Bathing assist Assist Level: Minimal Assistance - Patient > 75%     Upper Body Dressing/Undressing Upper body dressing   What is the patient wearing?: Bra, Pull over shirt    Upper body assist Assist Level: Minimal Assistance - Patient > 75%    Lower Body Dressing/Undressing Lower body dressing      What is the patient wearing?: Underwear/pull up, Pants     Lower body assist Assist for lower body dressing: Moderate Assistance - Patient 50 - 74%     Toileting Toileting    Toileting assist Assist for toileting: Contact Guard/Touching assist     Transfers Chair/bed transfer  Transfers assist     Chair/bed transfer assist level: Minimal Assistance - Patient > 75%     Locomotion Ambulation   Ambulation assist  Assist level: Moderate Assistance - Patient 50 - 74% Assistive device: Walker-rolling Max distance: 110   Walk 10 feet activity   Assist     Assist level: Minimal Assistance - Patient > 75% Assistive device: Walker-rolling   Walk 50 feet activity   Assist    Assist level: Moderate Assistance - Patient - 50 - 74% Assistive device: Walker-rolling    Walk 150 feet activity   Assist Walk 150 feet activity did not occur: Safety/medical concerns         Walk 10 feet on uneven surface  activity   Assist Walk 10 feet on uneven surfaces activity did not occur: Safety/medical concerns         Wheelchair     Assist Is the patient using a wheelchair?: Yes Type of Wheelchair: Manual    Wheelchair  assist level: Dependent - Patient 0%      Wheelchair 50 feet with 2 turns activity    Assist        Assist Level: Dependent - Patient 0%   Wheelchair 150 feet activity     Assist      Assist Level: Dependent - Patient 0%   Blood pressure 110/80, pulse 94, temperature 97.7 F (36.5 C), temperature source Oral, resp. rate 18, height 5\' 2"  (1.575 m), weight 93.8 kg, SpO2 98%.   Medical Problem List and Plan: 1. Functional deficits secondary to left pontine stroke             -patient may shower             -ELOS/Goals: 12 to 14 days, PT/OT/SLP supervision to mod I  -Team conference yesterday-  set d/c for 2 weeks  -Con't CIR PT, and OT   2.  Antithrombotics: -DVT/anticoagulation:  Pharmaceutical: Lovenox 40mg  daily             -antiplatelet therapy: DAPT X 3 weeks followed by ASA alone 3. Pain Management:  N/A 4. Mood/Behavior/Sleep: LCSW to follow for evaluation and support             -antipsychotic agents: N/A  -Melatonin PRN 5. Neuropsych/cognition: This patient is capable of making decisions on her own behalf. 6. Skin/Wound Care: Routine pressure relief measures.  7. Fluids/Electrolytes/Nutrition: Monitor I/O. Check CMET in am.  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 On Tresiba 16 units w/ Novolog 20 units TID ac/ Endo @ [facility]             --Continue Insulin gargline 30 units with 15 units TID ac --Monitor BS ac/hs and continue to educate on compliance, risk factors/benefits. She didn't take insulin consistently and does not wear CGM due to "excessive beeping" -8/27- CBGs 195-221- will increase Semglee to 33 units daily- first dose in AM -8/28- CBGs 182 this AM- but hasn't started new dosing of Semglee- will monitor for trend.  -8/29- CBGs 160-278- worse this AM- will increase Semglee to 37 units in AM- won't get until tomorrow AM -8/30- CBGs better today- in low to mid 200s- down from 300s- con't regimen but will need  changes this weekend -12/29/22 CBGs 220s-280s, overall better, just started increased dose yesterday; would monitor for trend before more adjustments made -12/30/22 CBGs about the same, using significant amount of SSI novolog, will increase TID novolog up to 18U and see if we get improvement this way; leave semglee in place for now since this is only third day of increased dose.   CBG (last  3)  Recent Labs    12/29/22 1715 12/29/22 2055 12/30/22 0553  GLUCAP 285* 272* 234*    9. HTN: Monitor BP TID- continue to hold Lisinopril and hydrochlorothiazide.  Long-term goal normotensive --BP controlled currently  -12/30/22 BPs great! Monitor  Vitals:   12/26/22 1931 12/27/22 0407 12/27/22 1527 12/27/22 1948  BP: 115/66 (!) 105/54 109/84 122/77   12/28/22 0617 12/28/22 1404 12/28/22 1923 12/29/22 0338  BP: 115/85 138/83 110/76 (P) 118/88   12/29/22 0343 12/29/22 1436 12/29/22 1939 12/30/22 0346  BP: 118/88 122/70 (!) 141/96 110/80    10. Hyponatremia: Question chronic @131 -132 range per chart review             --recheck BMET in am   8/27- Na 135 11. Hyperlipidemia: Trig-245/LDL-180. Now on increased dose Crestor 40 mg.  Dietary education 12. Hypomagnesemia: Recheck in am. Now on Mag ox 400mg  daily. 8/27- Will check Mg in AM  8/28- mg hasn't bene done- not clear if pt refused labs or what.  8/29- Mg up to 2.0- came back at 18:56am 13. Chronic leucocytosis: Trend WBC and monitor for signs of infection.              --improved from 13.4 to 11.9  8/27- will check U/ And Cx and CXR because up to 16k 8/28- U/ A negative- has large bacteria, which could be colonization but otherwise negative- CXR negative.   8/29- feels well- will recheck in AM just to monitor and weekly  8/30- down to 12.4 14. Morbid obesity: BMI- 39.4. CM/HH diet. RD consulted to review diet.  --Educate patient on importance of weight loss and exercise to help promote health, improve AIC and mobility.  15. Constipation:  Declines need for laxative. Advised that prns on board. Monitor for now.  8/27- changed/added Senna-S 1 tab at bedtime-  8/28- small BM yesterday AM- will monitor and if doesn't go by tomorrow, will give sorbitol.  8/29- had medium BM yesterday -12/30/22 LBM yesterday, monitor     LOS: 6 days A FACE TO FACE EVALUATION WAS PERFORMED  [address] Behring 12/30/2022, 10:29 AM

## 2022-12-31 DIAGNOSIS — I639 Cerebral infarction, unspecified: Secondary | ICD-10-CM | POA: Diagnosis not present

## 2022-12-31 LAB — BASIC METABOLIC PANEL
Anion gap: 9 (ref 5–15)
BUN: 19 mg/dL (ref 6–20)
CO2: 24 mmol/L (ref 22–32)
Calcium: 8.7 mg/dL — ABNORMAL LOW (ref 8.9–10.3)
Chloride: 99 mmol/L (ref 98–111)
Creatinine, Ser: 1 mg/dL (ref 0.44–1.00)
GFR, Estimated: >60 mL/min (ref >60)
Glucose, Bld: 231 mg/dL — ABNORMAL HIGH (ref 70–99)
Potassium: 4.2 mmol/L (ref 3.5–5.1)
Sodium: 132 mmol/L — ABNORMAL LOW (ref 135–145)

## 2022-12-31 LAB — GLUCOSE, CAPILLARY
Glucose-Capillary: 237 mg/dL — ABNORMAL HIGH (ref 70–99)
Glucose-Capillary: 326 mg/dL — ABNORMAL HIGH (ref 70–99)
Glucose-Capillary: 251 mg/dL — ABNORMAL HIGH (ref 70–99)
Glucose-Capillary: 249 mg/dL — ABNORMAL HIGH (ref 70–99)

## 2022-12-31 LAB — CBC
HCT: 39.8 % (ref 36.0–46.0)
Hemoglobin: 12.5 g/dL (ref 12.0–15.0)
MCH: 28.1 pg (ref 26.0–34.0)
MCHC: 31.4 g/dL (ref 30.0–36.0)
MCV: 89.4 fL (ref 80.0–100.0)
Platelets: 342 10*3/uL (ref 150–400)
RBC: 4.45 MIL/uL (ref 3.87–5.11)
RDW: 12.9 % (ref 11.5–15.5)
WBC: 13.4 10*3/uL — ABNORMAL HIGH (ref 4.0–10.5)
nRBC: 0 % (ref 0.0–0.2)

## 2022-12-31 MED ORDER — INSULIN GLARGINE-YFGN 100 UNIT/ML ~~LOC~~ SOLN
40.0000 [IU] | Freq: Every day | SUBCUTANEOUS | Status: DC
  Filled 2022-12-31: qty 0.4

## 2022-12-31 NOTE — Progress Notes (Signed)
PROGRESS NOTE   Subjective/Complaints:  Pt reports no issues-  Eating a lot of snacks per staff-  even though CBGS running 227-395-   Pt doesn't want to wake up top speak with me.   ROS:   Pt denies SOB, abd pain, CP, N/V/C/D, and vision changes   Except for HPI   Objective:   No results found. Recent Labs    12/31/22 0629  WBC 13.4*  HGB 12.5  HCT 39.8  PLT 342   Recent Labs    12/31/22 0629  NA 132*  K 4.2  CL 99  CO2 24  GLUCOSE 231*  BUN 19  CREATININE 1.00  CALCIUM 8.7*     Intake/Output Summary (Last 24 hours) at 12/31/2022 0859 Last data filed at 12/30/2022 2141 Gross per 24 hour  Intake 792 ml  Output 0 ml  Net 792 ml        Physical Exam: Vital Signs Blood pressure (!) 133/91, pulse 95, temperature 97.7 F (36.5 C), temperature source Oral, resp. rate 18, height 5\' 2"  (1.575 m), weight 93.8 kg, SpO2 91%.    General: asleep= woke briefly to say no issues, then refused to talk further; NAD HENT: conjugate gaze; oropharynx moist CV: regular rate; no JVD Pulmonary: CTA B/L; no W/R/R- good air movement GI: soft, NT, ND, (+)BS Psychiatric: asleep Neurological: asleep PRIOR EXAMS: Neuro: Alert and oriented x 3, she is able to provide a clear and coherent history, follows commands, cranial nerves II through XII intact besides shoulder shrug is a little decreased on the right, able to name and repeat, no dysarthria or aphasia noted RUE: 4-/5 Deltoid, 4-/5 Biceps, 4-/5 Triceps,  4-/5 Grip LUE: 5/5 Deltoid, 5/5 Biceps, 5/5 Triceps, 5/5 Wrist Ext, 5/5 Grip RLE: HF 4-/5, KE 4-/5, ADF 4-/5, APF 4-/5 LLE: HF 5/5, KE 5/5, ADF 5/5, APF 5/5 Sensory exam normal for light touch and pain in all 4 limbs.  No abnormal tone appreciated.   Assessment/Plan: 1. Functional deficits which require 3+ hours per day of interdisciplinary therapy in a comprehensive inpatient rehab setting. Physiatrist is  providing close team supervision and 24 hour management of active medical problems listed below. Physiatrist and rehab team continue to assess barriers to discharge/monitor patient progress toward functional and medical goals  Care Tool:  Bathing    Body parts bathed by patient: Right arm, Left arm, Chest, Front perineal area, Abdomen, Face, Right upper leg, Left upper leg   Body parts bathed by helper: Buttocks, Left lower leg, Right lower leg     Bathing assist Assist Level: Minimal Assistance - Patient > 75%     Upper Body Dressing/Undressing Upper body dressing   What is the patient wearing?: Bra, Pull over shirt    Upper body assist Assist Level: Minimal Assistance - Patient > 75%    Lower Body Dressing/Undressing Lower body dressing      What is the patient wearing?: Underwear/pull up, Pants     Lower body assist Assist for lower body dressing: Moderate Assistance - Patient 50 - 74%     Toileting Toileting    Toileting assist Assist for toileting: Contact Guard/Touching assist     Transfers  Chair/bed transfer  Transfers assist     Chair/bed transfer assist level: Minimal Assistance - Patient > 75%     Locomotion Ambulation   Ambulation assist      Assist level: Moderate Assistance - Patient 50 - 74% Assistive device: Walker-rolling Max distance: 110   Walk 10 feet activity   Assist     Assist level: Minimal Assistance - Patient > 75% Assistive device: Walker-rolling   Walk 50 feet activity   Assist    Assist level: Moderate Assistance - Patient - 50 - 74% Assistive device: Walker-rolling    Walk 150 feet activity   Assist Walk 150 feet activity did not occur: Safety/medical concerns         Walk 10 feet on uneven surface  activity   Assist Walk 10 feet on uneven surfaces activity did not occur: Safety/medical concerns         Wheelchair     Assist Is the patient using a wheelchair?: Yes Type of Wheelchair:  Manual    Wheelchair assist level: Dependent - Patient 0%      Wheelchair 50 feet with 2 turns activity    Assist        Assist Level: Dependent - Patient 0%   Wheelchair 150 feet activity     Assist      Assist Level: Dependent - Patient 0%   Blood pressure (!) 133/91, pulse 95, temperature 97.7 F (36.5 C), temperature source Oral, resp. rate 18, height 5\' 2"  (1.575 m), weight 93.8 kg, SpO2 91%.   Medical Problem List and Plan: 1. 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  Antithrombotics: -DVT/anticoagulation:  Pharmaceutical: Lovenox 40mg  daily             -antiplatelet therapy: DAPT X 3 weeks followed by ASA alone 3. Pain Management:  N/A 4. Mood/Behavior/Sleep: LCSW to follow for evaluation and support             -antipsychotic agents: N/A  -Melatonin PRN 5. Neuropsych/cognition: This patient is capable of making decisions on her own behalf. 6. Skin/Wound Care: Routine pressure relief measures.  7. Fluids/Electrolytes/Nutrition: Monitor I/O. Check CMET in am.  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 On Tresiba 16 units w/ Novolog 20 units TID ac/ Endo @ [facility]             --Continue Insulin gargline 30 units with 15 units TID ac --Monitor BS ac/hs and continue to educate on compliance, risk factors/benefits. She didn't take insulin consistently and does not wear CGM due to "excessive beeping" -8/27- CBGs 195-221- will increase Semglee to 33 units daily- first dose in AM -8/28- CBGs 182 this AM- but hasn't started new dosing of Semglee- will monitor for trend.  -8/29- CBGs 160-278- worse this AM- will increase Semglee to 37 units in AM- won't get until tomorrow AM -8/30- CBGs better today- in low to mid 200s- down from 300s- con't  regimen but will need changes this weekend -12/29/22 CBGs 220s-280s, overall better, just started increased dose yesterday; would monitor for trend before more adjustments made 9/2- CBGs 227-395- eating a LOT of snacks- will increase Semglee to  40 units daily- I don't think we will get good control- she just eats more snacks- and nursing and I have spoken with her- she has no desire to get better control.  CBG (last 3)  Recent Labs    12/30/22 1716 12/30/22 2048 12/31/22 0554  GLUCAP 240* 395* 237*    9. HTN: Monitor BP TID- continue to hold Lisinopril and hydrochlorothiazide.  Long-term goal normotensive --BP controlled currently  -12/29/22 BPs great! Monitor  Vitals:   12/27/22 1948 12/28/22 0617 12/28/22 1404 12/28/22 1923  BP: 122/77 115/85 138/83 110/76   12/29/22 0338 12/29/22 0343 12/29/22 1436 12/29/22 1939  BP: (P) 118/88 118/88 122/70 (!) 141/96   12/30/22 0346 12/30/22 1514 12/30/22 1918 12/31/22 0440  BP: 110/80 123/84 111/69 (!) 133/91    10. Hyponatremia: Question chronic @131 -132 range per chart review             --recheck BMET in am   8/27- Na 135 11. Hyperlipidemia: Trig-245/LDL-180. Now on increased dose Crestor 40 mg.  Dietary education 12. Hypomagnesemia: Recheck in am. Now on Mag ox 400mg  daily. 8/27- Will check Mg in AM  8/28- mg hasn't bene done- not clear if pt refused labs or what.  8/29- Mg up to 2.0- came back at 18:56am 13. Chronic leucocytosis: Trend WBC and monitor for signs of infection.              --improved from 13.4 to 11.9  8/27- will check U/ And Cx and CXR because up to 16k 8/28- U/ A negative- has large bacteria, which could be colonization but otherwise negative- CXR negative.   8/29- feels well- will recheck in AM just to monitor and weekly  8/30- down to 12.4  9/2- 13.4k- con't to monitor weekly 14. Morbid obesity: BMI- 39.4. CM/HH diet. RD consulted to review diet.  --Educate patient on importance of weight loss and exercise to help  promote health, improve AIC and mobility.  15. Constipation: Declines need for laxative. Advised that prns on board. Monitor for now.  8/27- changed/added Senna-S 1 tab at bedtime-  8/28- small BM yesterday AM- will monitor and if doesn't go by tomorrow, will give sorbitol.  8/29- had medium BM yesterday -12/29/22 LBM this morning, monitor 9/2- 2 Bms overnight- con't regimen    I spent a total of 35   minutes on total care today- >50% coordination of care- due to  Complex medical issues- reviewing labs; vitals and CBGs- titrated CBGs- also d/w pt/nursing about DM and snacks     LOS: 7 days A FACE TO FACE EVALUATION WAS PERFORMED  Rockland Kotarski 12/31/2022, 8:59 AM

## 2022-12-31 NOTE — Progress Notes (Signed)
Physical Therapy Session Note  Patient Details  Name: Lisa Werner MRN: [id number] Date of Birth: 12-14-1980  Today's Date: 12/31/2022 PT Individual Time: 1420-1534 PT Individual Time Calculation (min): 74 min   Short Term Goals: Week 1:  PT Short Term Goal 1 (Week 1): Pt walks >100 ft with min A or better PT Short Term Goal 2 (Week 1): Pt will maintain RW when turning and walking without cue PT Short Term Goal 3 (Week 1): Pt will ambulate 12 steps with assist  Skilled Therapeutic Interventions/Progress Updates:    Pt presents in room in bed, asleep but awakens easily. Pt agreeable to PT. Pt denies pain at this time. Session focused on gait training for endurance as well as BLE coordination, multidirectional stepping stability.  Pt completes bed mobility supervision using bed rails. Pt completes sit<>stand and stand pivot transfer with CGA RW to WC. Pt transported from room to day room, completes ambulatory transfer with RW CGA to nustep. Pt completes continuous training on nustep BUE/BLE with pr  Pt ambulates without device to restroom CGA/min assist ~30', CGA for toilet transfer. Pt completes 3/3 toileting tasks with supervision. Pt provided with cues to complete hand hygiene with pt managing RUE well for hand hygiene. Pt ambulates back to [facility] for seated rest break.  Pt ambulates 350' without device CGA/min assist, inconsistent step length with RLE with some ataxia noted very minimal scissoring noted. Inconsistent stance time noted on RLE as well, no hyperextension noted in stance.  Pt completes forward/backward gait 3x15' with min assist, increased ataxia noted and increased postural sway with backwards ambulation. Pt completes side stepping 3x15' bilaterally, verbal cues for desired muscle activation, requires more assist with fatigue only up to min assist.  Pt transported to main gym via [facility] for time management and energy conservation. Pt completes step ups 2x10 BLE with BHRs on  3" step and step ups x10 BLE with BHRs on 6" step. Verbal cues provided for controlling RLE foot placement with step ups with LLE, as well as eccentric control with step ups bilaterally with pt demonstrating understanding.  Pt ambulates 120' without device, 2.5# ankle weight donned, continues to demo inconsistent step length and stance time RLE, increased fatigue noted requires CGA/min assist with ambulation.  Pt transported back to room in [facility] for time management.  Pt completes transfer and bed mobility with CGA, remains sidelying in bed with all needs within reach, call light in place and bed alarm activated at end of session.   Therapy Documentation Precautions:  Precautions Precautions: Fall Precaution Comments: R hemi, inattention Restrictions Weight Bearing Restrictions: No  Therapy/Group: Individual Therapy  Edwin Cap PT, DPT 12/31/2022, 3:37 PM

## 2022-12-31 NOTE — Progress Notes (Signed)
SLP Cancellation Note  Patient Details Name: Tashona Jalil MRN: [id number] DOB: 02/22/1981   Cancelled treatment:        Patient missed 60 minutes of skilled SLP intervention due to fatigue.  SLP attempted to awaken patient for ~10 minutes. Patient would open her eyes but quickly fall back asleep. Nursing aware. Will re-attempt as able.                                                                                                Tryphena Perkovich 12/31/2022, 11:48 AM

## 2022-12-31 NOTE — Progress Notes (Signed)
Occupational Therapy Session Note  Patient Details  Name: Lisa Werner MRN: [id number] Date of Birth: 12-Sep-1980  Today's Date: 12/31/2022 OT Individual Time: 0805-0900 OT Individual Time Calculation (min): 55 min    Short Term Goals: Week 1:  OT Short Term Goal 1 (Week 1): Pt will complete LB dressing Min A with AE as necessary OT Short Term Goal 2 (Week 1): Pt will complete toilet transfer CGA with only occasional cues for safety awareness  Skilled Therapeutic Interventions/Progress Updates:     Patient agreeable to participate in OT session. Reports 0/10 pain level.    Patient participated in skilled OT session focusing on ADL re-trianing and functional mobility. Pt asleep upon therapy arrival with breakfast tray next to bed untouched. With increased time, pt was able to wake up and begin therapy session. Pt requested to use bathroom at start of session.   Bed mobility: Pt demonstrated Mod I level  to transition from supine to sitting EOB.  Functional mobility: With OT providing CGA assist, pt navigated from bed to bathroom utilizing RW.  Toileting: OT provided Mod A for toileting. Pt required assist for thoroughness during hygiene and to pull mesh underwear up and position new maxi pad.  Min A provided to maintain balance while pt pulled pants down prior to sitting on toilet. Pt demonstrated almost 90 degrees hip flexion while pushing pants all the way down to ankles and was unable to maintain her balance without assist.   LB dressing: Pt complete while seated and then standing requiring Mod A. Pt educated on sequencing and safety awareness. Non-slip socks on when donning new pants which created increased difficulty during task and pt required additional time.   Grooming: Pt complete while standing at sink to wash hands requiring SBA.  Self Feeding: Pt completed self-feeding during breakfast to work on RUE coordination and functional use. Pt was able to open all containers  without assist (syrup, juice, butter). Pt used right hand to cut pancakes with knife with Max difficulty d/t decreased hand strength and coordination and supplies available. OT finished cutting up pancake and sausage and provided red foam for fork to increase pt's ability to grip and hold utensil. Pt was then able to stab food holding fork with right hand with Mod difficulty and increased time still required.   Therapy Documentation Precautions:  Precautions Precautions: Fall Precaution Comments: R hemi, inattention Restrictions Weight Bearing Restrictions: No   Therapy/Group: Individual Therapy  Limmie Patricia, OTR/L,CBIS  Supplemental OT - [facility] and [facility] Secure Chat Preferred   12/31/2022, 7:47 AM

## 2023-01-01 DIAGNOSIS — I639 Cerebral infarction, unspecified: Secondary | ICD-10-CM | POA: Diagnosis not present

## 2023-01-01 LAB — FACTOR 5 LEIDEN

## 2023-01-01 LAB — GLUCOSE, CAPILLARY
Glucose-Capillary: 196 mg/dL — ABNORMAL HIGH (ref 70–99)
Glucose-Capillary: 256 mg/dL — ABNORMAL HIGH (ref 70–99)
Glucose-Capillary: 165 mg/dL — ABNORMAL HIGH (ref 70–99)
Glucose-Capillary: 250 mg/dL — ABNORMAL HIGH (ref 70–99)

## 2023-01-01 MED ORDER — INSULIN GLARGINE-YFGN 100 UNIT/ML ~~LOC~~ SOLN
45.0000 [IU] | Freq: Every day | SUBCUTANEOUS | Status: DC
  Administered 2023-01-01 – 2023-01-03 (×3): 45 [IU] via SUBCUTANEOUS
  Filled 2023-01-01 (×3): qty 0.45

## 2023-01-01 NOTE — Progress Notes (Signed)
PROGRESS NOTE   Subjective/Complaints:  Pt seen with OT, denies snack foods in room but states she has candy , we discussed her major CVA risk factors of HTN and DM, BP is now controlled but CBGs are not   ROS:   Pt denies SOB, abd pain, CP, N/V/C/D, and vision changes   Except for HPI   Objective:   No results found. Recent Labs    12/31/22 0629  WBC 13.4*  HGB 12.5  HCT 39.8  PLT 342   Recent Labs    12/31/22 0629  NA 132*  K 4.2  CL 99  CO2 24  GLUCOSE 231*  BUN 19  CREATININE 1.00  CALCIUM 8.7*     Intake/Output Summary (Last 24 hours) at 01/01/2023 0742 Last data filed at 12/31/2022 1746 Gross per 24 hour  Intake 476 ml  Output --  Net 476 ml        Physical Exam: Vital Signs Blood pressure 115/66, pulse 87, temperature 98.5 F (36.9 C), resp. rate 16, height 5\' 2"  (1.575 m), weight 93.8 kg, SpO2 95%.   General: No acute distress Mood and affect are appropriate Heart: Regular rate and rhythm no rubs murmurs or extra sounds Lungs: Clear to auscultation, breathing unlabored, no rales or wheezes Abdomen: Positive bowel sounds, soft nontender to palpation, nondistended Extremities: No clubbing, cyanosis, or edema Skin: No evidence of breakdown, no evidence of rash  PRIOR EXAMS: Neuro: Alert and oriented x 3, she is able to provide a clear and coherent history, follows commands, cranial nerves II through XII intact besides shoulder shrug is a little decreased on the right, able to name and repeat, no dysarthria or aphasia noted RUE: 4-/5 Deltoid, 4-/5 Biceps, 4-/5 Triceps,  4-/5 Grip LUE: 5/5 Deltoid, 5/5 Biceps, 5/5 Triceps, 5/5 Wrist Ext, 5/5 Grip RLE: HF 4-/5, KE 4-/5, ADF 4-/5, APF 4-/5 LLE: HF 5/5, KE 5/5, ADF 5/5, APF 5/5 Sensory exam normal for light touch and pain in all 4 limbs.  No abnormal tone appreciated.   Assessment/Plan: 1. Functional deficits which require 3+ hours per day  of interdisciplinary therapy in a comprehensive inpatient rehab setting. Physiatrist is providing close team supervision and 24 hour management of active medical problems listed below. Physiatrist and rehab team continue to assess barriers to discharge/monitor patient progress toward functional and medical goals  Care Tool:  Bathing    Body parts bathed by patient: Right arm, Left arm, Chest, Front perineal area, Abdomen, Face, Right upper leg, Left upper leg   Body parts bathed by helper: Buttocks, Left lower leg, Right lower leg     Bathing assist Assist Level: Minimal Assistance - Patient > 75%     Upper Body Dressing/Undressing Upper body dressing   What is the patient wearing?: Bra, Pull over shirt    Upper body assist Assist Level: Minimal Assistance - Patient > 75%    Lower Body Dressing/Undressing Lower body dressing      What is the patient wearing?: Underwear/pull up, Pants     Lower body assist Assist for lower body dressing: Moderate Assistance - Patient 50 - 74%     Editor, commissioning  assist Assist for toileting: Contact Guard/Touching assist     Transfers Chair/bed transfer  Transfers assist     Chair/bed transfer assist level: Minimal Assistance - Patient > 75%     Locomotion Ambulation   Ambulation assist      Assist level: Moderate Assistance - Patient 50 - 74% Assistive device: Walker-rolling Max distance: 110   Walk 10 feet activity   Assist     Assist level: Minimal Assistance - Patient > 75% Assistive device: Walker-rolling   Walk 50 feet activity   Assist    Assist level: Moderate Assistance - Patient - 50 - 74% Assistive device: Walker-rolling    Walk 150 feet activity   Assist Walk 150 feet activity did not occur: Safety/medical concerns         Walk 10 feet on uneven surface  activity   Assist Walk 10 feet on uneven surfaces activity did not occur: Safety/medical concerns          Wheelchair     Assist Is the patient using a wheelchair?: Yes Type of Wheelchair: Manual    Wheelchair assist level: Dependent - Patient 0%      Wheelchair 50 feet with 2 turns activity    Assist        Assist Level: Dependent - Patient 0%   Wheelchair 150 feet activity     Assist      Assist Level: Dependent - Patient 0%   Blood pressure 115/66, pulse 87, temperature 98.5 F (36.9 C), resp. rate 16, height 5\' 2"  (1.575 m), weight 93.8 kg, SpO2 95%.   Medical Problem List and Plan: 1. Functional deficits secondary to left pontine stroke             -patient may shower             -ELOS/Goals: 12 to 14 days, PT/OT/SLP supervision to mod I  Team conference today   Con't CIR PT, OT  2.  Antithrombotics: -DVT/anticoagulation:  Pharmaceutical: Lovenox 40mg  daily             -antiplatelet therapy: DAPT X 3 weeks followed by ASA alone 3. Pain Management:  N/A 4. Mood/Behavior/Sleep: LCSW to follow for evaluation and support             -antipsychotic agents: N/A  -Melatonin PRN 5. Neuropsych/cognition: This patient is capable of making decisions on her own behalf. 6. Skin/Wound Care: Routine pressure relief measures.  7. Fluids/Electrolytes/Nutrition: Monitor I/O. Check CMET in am.  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 On Tresiba 16 units w/ Novolog 20 units TID ac/ Endo @ [facility]             --Continue Insulin gargline 30 units with 15 units TID ac --Monitor BS ac/hs and continue to educate on compliance, risk factors/benefits. She didn't take insulin consistently and does not wear CGM due to "excessive beeping" -8/27- CBGs 195-221- will increase Semglee to 33 units daily- first dose in AM -8/28- CBGs 182 this AM- but hasn't started new dosing of Semglee- will monitor for trend.  -8/29- CBGs 160-278- worse this AM- will increase Semglee to 37 units in AM- won't get until tomorrow AM -8/30- CBGs better today- in low to  mid 200s- down from 300s- con't regimen but will need changes this weekend -12/29/22 CBGs 220s-280s, overall better, just started increased dose yesterday; would monitor for trend before more adjustments made 9/2- CBGs 227-395- eating a LOT of snacks- will  increase Semglee to 40 units daily- I don't think we will get good control- she just eats more snacks- and nursing and I have spoken with her- she has no desire to get better control.  CBG (last 3)  Recent Labs    12/31/22 1625 12/31/22 2037 01/01/23 0638  GLUCAP 251* 249* 196*   Increase Semglee to 45U , discussed no candy, may have diabetic candy  9. HTN: Monitor BP TID- continue to hold Lisinopril and hydrochlorothiazide.  Long-term goal normotensive --BP controlled currently  -9/3 BP controlled  Vitals:   12/28/22 1923 12/29/22 0338 12/29/22 0343 12/29/22 1436  BP: 110/76 (P) 118/88 118/88 122/70   12/29/22 1939 12/30/22 0346 12/30/22 1514 12/30/22 1918  BP: (!) 141/96 110/80 123/84 111/69   12/31/22 0440 12/31/22 1340 12/31/22 1832 01/01/23 0518  BP: (!) 133/91 125/83 114/82 115/66    10. Hyponatremia: Question chronic @131 -132 range per chart review             --recheck BMET in am   8/27- Na 135 11. Hyperlipidemia: Trig-245/LDL-180. Now on increased dose Crestor 40 mg.  Dietary education 12. Hypomagnesemia: Recheck in am. Now on Mag ox 400mg  daily. 8/27- Will check Mg in AM  8/28- mg hasn't bene done- not clear if pt refused labs or what.  8/29- Mg up to 2.0- came back at 18:56am 13. Chronic leucocytosis: Trend WBC and monitor for signs of infection.              --improved from 13.4 to 11.9  8/27- will check U/ And Cx and CXR because up to 16k 8/28- U/ A negative- has large bacteria, which could be colonization but otherwise negative- CXR negative.   8/29- feels well- will recheck in AM just to monitor and weekly  8/30- down to 12.4  9/2- 13.4k- con't to monitor weekly 14. Morbid obesity: BMI- 39.4. CM/HH diet. RD  consulted to review diet.  --Educate patient on importance of weight loss and exercise to help promote health, improve AIC and mobility.  15. Constipation: Declines need for laxative. Advised that prns on board. Monitor for now.  8/27- changed/added Senna-S 1 tab at bedtime-  8/28- small BM yesterday AM- will monitor and if doesn't go by tomorrow, will give sorbitol.  8/29- had medium BM yesterday -12/29/22 LBM this morning, monitor 9/2- 2 Bms overnight- con't regimen         LOS: 8 days A FACE TO FACE EVALUATION WAS PERFORMED  Erick Colace 01/01/2023, 7:42 AM

## 2023-01-01 NOTE — Progress Notes (Signed)
Physical Therapy Weekly Progress Note  Patient Details  Name: Lisa Werner MRN: [id number] Date of Birth: December 17, 1980  Beginning of progress report period: December 25, 2022 End of progress report period: January 01, 2023  Today's Date: 01/01/2023 PT Individual Time: 6578-4696, 1300-1330 PT Individual Time Calculation (min): 45 min, 30 min   Patient has met 3 of 3 short term goals.  Pt is progressing well toward LTGs. She is ambulating over 300 ft with and without RW. She requires up to min A at times for safety and for LOB. Progressing well with all mobility.   Patient continues to demonstrate the following deficits muscle weakness, decreased cardiorespiratoy endurance, impaired timing and sequencing, decreased coordination, and decreased motor planning, and decreased standing balance, decreased postural control, and difficulty maintaining precautions and therefore will continue to benefit from skilled PT intervention to increase functional independence with mobility.  Patient progressing toward long term goals..  Continue plan of care.  PT Short Term Goals Week 1:  PT Short Term Goal 1 (Week 1): Pt walks >100 ft with min A or better PT Short Term Goal 1 - Progress (Week 1): Met PT Short Term Goal 2 (Week 1): Pt will maintain RW when turning and walking without cue PT Short Term Goal 2 - Progress (Week 1): Met PT Short Term Goal 3 (Week 1): Pt will ambulate 12 steps with assist PT Short Term Goal 3 - Progress (Week 1): Met Week 2:  PT Short Term Goal 1 (Week 2): =LTGs d/t ELOS  Skilled Therapeutic Interventions/Progress Updates:   Session 1: Pt received in bed and agreeable to therapy. No complaint of pain.   Pt requested to use bathroom. Ambulated to bathroom, supervision for 3/3 toileting tasks. CGA for hand hygiene at sink.   Session focused on ambulation with and without RW. Pt ambulated with supervision-CGA with RW and CGA-min without. No true LOB, but noted  inconsistent step length and stance time. Pt ambulated forward and backward 3 x 2 x 30 ft with no AD and min A, progressed to adding 4lb ankle weights for endurance and global strength. Pt also navigated ramp with and without AD in the same manner for dynamic gait training.   Returned to room and to recliner, was left with all needs in reach and alarm active.   Session 2: Pt seated in w/c on arrival and agreeable to therapy. No complaint of pain. Pt transported outside for gait in community environment. Pt ambulated over level and unlevel surfaces using RW with CGA. Cueing for slowing gait for safety in new environment. Pt also navigated outdoor steps x full flight. Min A when stepping forward, but able to perform with CGA when using L hand rail and lateral technique. Pt returned to room and to bed with supervision, was left with all needs in reach and alarm active.   Therapy Documentation Precautions:  Precautions Precautions: Fall Precaution Comments: R hemi, inattention Restrictions Weight Bearing Restrictions: No General:      Therapy/Group: Individual Therapy  Juluis Rainier 01/01/2023, 12:54 PM

## 2023-01-01 NOTE — Progress Notes (Signed)
Speech Language Pathology Weekly Progress and Session Note  Patient Details  Name: Lisa Werner MRN: [id number] Date of Birth: 19-Apr-1981  Beginning of progress report period: December 25, 2022 End of progress report period: January 01, 2023  Today's Date: 01/01/2023 SLP Individual Time: 0200-0303 SLP Individual Time Calculation (min): 63 min  Short Term Goals: Week 1: SLP Short Term Goal 1 (Week 1): Patient will utilize swallowing compensatory strategies during PO consumption to reduce risk of aspiration with min multimodal A SLP Short Term Goal 1 - Progress (Week 1): Met SLP Short Term Goal 2 (Week 1): Patient will increase speech intelligibility to 90% at the conversational level with min multimodal A SLP Short Term Goal 2 - Progress (Week 1): Met SLP Short Term Goal 3 (Week 1): Patient will demonstate problem solving abilities during complex daily situations with min mulitmodal A SLP Short Term Goal 3 - Progress (Week 1): Met SLP Short Term Goal 4 (Week 1): Patient will recall 2 cognitive and 2 physical changes since admission with min mulitmodal A SLP Short Term Goal 4 - Progress (Week 1): Met    New Short Term Goals: Week 2: SLP Short Term Goal 1 (Week 2): STGs= LTGs due to ELOS  Weekly Progress Updates: Pt has made steady progress and has met 4 of 4 STG's this reporting period due to improved problem solving, speech intelligibility, awareness, and swallowing function. Currently pt is sup to min A for cognition and mod I for swallowing. She tolerated regular solids and thin liquids and has begun to trial sips via straw with no s/sx pen/asp. Pt would benefit from continued skilled SLP intervention to maximize cognition in order to maximize her functional independence prior to discharge.   Intensity: Minumum of 1-2 x/day, 30 to 90 minutes Frequency: 1 to 3 out of 7 days Duration/Length of Stay: Sept 11 Treatment/Interventions: Cognitive  remediation/compensation;Dysphagia/aspiration precaution training;Internal/external aids;Cueing hierarchy;Therapeutic Activities;Functional tasks;Patient/family education;Therapeutic Exercise   Daily Session  Skilled Therapeutic Interventions: Pt was seen in am to address cognitive re- training. Pt was alert and seated upright on EOB upon SLP arrival. She requested to go to the bathroom and SLP assisted with pt demo some impulsivity and decreased safety awareness as indicated by standing with tray table in front of her and fast pace of walking toward bathroom likely due to urgency. Improved safety awareness observed on return to bed. SLP addressed medication management. SLP reviewed current medication list, rationale and frequency. Pt asked appropriate questions throughout discussion. SLP presented BID pill box with pt challenged to use visual in order to fill pill box for a weeks time. Pt completed task with mod I. In additional minutes of session SLP addressed speech intelligibility. Pt recalled speech strategies for over articulation and amplifying voice. Given increased distance between pt and clinician, SLP instructed pt in use of aforementioned strategies during semi structured speech task. Pt was ~90% intelligible in conversation with occasional instances of imprecise articulation or low vocal intensity impacting intelligibility. Pt was responsive to min cues and demo ability to repeat statement with improved intensity and/or articulation. Pt was left seated at EOB with call button within reach and bed alarm active. SLP to continue POC.   Pain Pain Assessment Pain Scale: 0-10 Pain Score: 0-No pain  Therapy/Group: Individual Therapy  Renaee Munda 01/01/2023, 3:23 PM

## 2023-01-01 NOTE — Patient Care Conference (Signed)
Inpatient RehabilitationTeam Conference and Plan of Care Update Date: 01/01/2023   Time: 11:11 AM   Patient Name: Lisa Werner      Medical Record Number: [id number]  Date of Birth: 1980-10-15 Sex: Female         Room/Bed: 4M06C/4M06C-01 Payor Info: Payor: [employer] / Plan: [facility] PPO / Product Type: *No Product type* /    Admit Date/Time:  12/24/2022  3:12 PM  Primary Diagnosis:  Left pontine stroke [facility])  Hospital Problems: Principal Problem:   Left pontine stroke [facility])    Expected Discharge Date: Expected Discharge Date: 01/09/23  Team Members Present: Physician leading conference: Dr. Faith Rogue Social Worker Present: Cecile Sheerer, LCSWA Nurse Present: Vedia Pereyra, RN PT Present: Bernie Covey, PT OT Present: Ardis Rowan, COTA;Hope Cheyenne Adas, OT SLP Present: Feliberto Gottron, SLP PPS Coordinator present : Edson Snowball, PT     Current Status/Progress Goal Weekly Team Focus  Bowel/Bladder   Patient is usually continent of bowel and bladder   To be 100% continent   Make more frequent trips to [facility] to avoid time of incontinence which usually happen with sleep or dribbling when she doesnt reach the BR soon enough.    Swallow/Nutrition/ Hydration   Regular textures with thin liquids, Intermittent supervision   mod I  tolerance of current diet with use of strategies    ADL's   Mod A LB ADL (has just started to educate on use of sock aid), Min A UB ADL, functional transfers/mobility with RW, Right hemi. Decreased right hand strength. Set-up self-feeding   supervision-Mod I   Cont education with AE for LB dressing, safety awareness with Right side weakness, family training    Mobility   CGA-min A, gait up to 350 ft, improving gait mechanics and endurance   mod I  LE NMR for safety with independent gait.    Communication   Supervision-Min A   Mod I   use of speech intelligibility strategies during structured and informal  tasks at the sentence level    Safety/Cognition/ Behavioral Observations  Min-Mod A   Mod I   functional problem solving, emergent awareness    Pain   Patient usually denies pain.   Remain pain free   Use 1- 10 pain scale to rate pain    Skin   Patient has skin that is in good condition   To keep her skin healthy  Focus on blood sugar control.  snacking on protein instead of sugar.  With elevated sugars, wear shoes and avoid injury.      Discharge Planning:  Pt discharge to home- and her father and stepmother will help assist. No contact with any family member at this time. SW will confirm there are no barriers to discharge.   Team Discussion: Left pontine stroke. Very flat affect. Doesn't engage with staff. Denies pain. Skin is intact. Diet education is on-going with A1C of 15.5.  AC/HS. Stopped wearing Dexcom due to constant beeping.  Daily weights.  Tolerating heart/healthy-carb-mod diet. Continues with poor safety awareness, working on gait and balance.  Unsure is better with or without AD. Has 20 steps to get up to apartment.  Patient on target to meet rehab goals: yes, will continue to get as independent as possible with discharge date of 01/09/2023  *See Care Plan and progress notes for long and short-term goals.   Revisions to Treatment Plan:  Addressing mood/behavior.  Monitor labs and VS Teaching Needs: Medications, safety, self care,  diet/lifestyle modifications, gait/transfer training, etc.   Current Barriers to Discharge: Decreased caregiver support, Home enviroment access/layout, New diabetic, Weight, and Medication compliance  Possible Resolutions to Barriers: Family education Able to go up steps safely Adhere to appropriate diet/lifestyle modifications Compliance with antidiabetic medications Order recommended DME     Medical Summary Current Status: left pontine infarct. right hemparesis. poor diabetes control, bp borderline. ?depressed  Barriers to  Discharge: Medical stability;Uncontrolled Diabetes   Possible Resolutions to [employer]: daily assessment of labs, cbgs, adjustment to medical regimen as needed. antidepressant, neuropsych?   Continued Need for Acute Rehabilitation Level of Care: The patient requires daily medical management by a physician with specialized training in physical medicine and rehabilitation for the following reasons: Direction of a multidisciplinary physical rehabilitation program to maximize functional independence : Yes Medical management of patient stability for increased activity during participation in an intensive rehabilitation regime.: Yes Analysis of laboratory values and/or radiology reports with any subsequent need for medication adjustment and/or medical intervention. : Yes   I attest that I was present, lead the team conference, and concur with the assessment and plan of the team.   Jearld Adjutant 01/01/2023, 2:24 PM

## 2023-01-01 NOTE — Progress Notes (Signed)
Occupational Therapy Session Note  Patient Details  Name: Lisa Werner MRN: [id number] Date of Birth: March 06, 1981  Today's Date: 01/01/2023 OT Individual Time: 0700-0800 OT Individual Time Calculation (min): 60 min    Short Term Goals: Week 1:  OT Short Term Goal 1 (Week 1): Pt will complete LB dressing Min A with AE as necessary OT Short Term Goal 2 (Week 1): Pt will complete toilet transfer CGA with only occasional cues for safety awareness  Skilled Therapeutic Interventions/Progress Updates:    Pt resting in bed upon arrival. Pt declined changing clothing this morning. Supine>sit EOB with supervision. Pt declined use of toilet. Pt amb with RW (CGA) to tub room to practice TTB transfers with supervsion. Pt with poor safety awareness requiring mod verbal cues for attention to task and safety awareness. Pt amb with RW to ADL apartment to practice furniture transfers and bed mobility on std bed. Pt completed all tasks with supervision. Pt amb with RW to gym for table tasks with focus on RUE FMC/strengtheneing. Pt issued foam tube to place on pen/pencil to improve grasp and practice handwriting. Pt issued yellow threaputty. Pt returned demonstrated hand strengthening/dexterity tasks with theraputty. Pt returned to room and sat EOB with table placed in front. Bed alarm activated.   Therapy Documentation Precautions:  Precautions Precautions: Fall Precaution Comments: R hemi, inattention Restrictions Weight Bearing Restrictions: No Pain: Pt denies pain this morning   Therapy/Group: Individual Therapy  Rich Brave 01/01/2023, 8:55 AM

## 2023-01-02 DIAGNOSIS — Z794 Long term (current) use of insulin: Secondary | ICD-10-CM

## 2023-01-02 DIAGNOSIS — F329 Major depressive disorder, single episode, unspecified: Secondary | ICD-10-CM | POA: Diagnosis not present

## 2023-01-02 DIAGNOSIS — I10 Essential (primary) hypertension: Secondary | ICD-10-CM | POA: Diagnosis not present

## 2023-01-02 DIAGNOSIS — E1165 Type 2 diabetes mellitus with hyperglycemia: Secondary | ICD-10-CM | POA: Diagnosis not present

## 2023-01-02 DIAGNOSIS — I639 Cerebral infarction, unspecified: Secondary | ICD-10-CM | POA: Diagnosis not present

## 2023-01-02 LAB — GLUCOSE, CAPILLARY
Glucose-Capillary: 297 mg/dL — ABNORMAL HIGH (ref 70–99)
Glucose-Capillary: 335 mg/dL — ABNORMAL HIGH (ref 70–99)
Glucose-Capillary: 161 mg/dL — ABNORMAL HIGH (ref 70–99)
Glucose-Capillary: 268 mg/dL — ABNORMAL HIGH (ref 70–99)

## 2023-01-02 MED ORDER — MAGNESIUM HYDROXIDE 400 MG/5ML PO SUSP
45.0000 mL | Freq: Once | ORAL | Status: AC
  Administered 2023-01-02: 45 mL via ORAL
  Filled 2023-01-02: qty 60

## 2023-01-02 MED ORDER — SENNOSIDES-DOCUSATE SODIUM 8.6-50 MG PO TABS
2.0000 | ORAL_TABLET | Freq: Every day | ORAL | Status: DC
  Administered 2023-01-02 – 2023-01-08 (×7): 2 via ORAL
  Filled 2023-01-02 (×7): qty 2

## 2023-01-02 MED ORDER — MAGNESIUM OXIDE -MG SUPPLEMENT 400 (240 MG) MG PO TABS
400.0000 mg | ORAL_TABLET | Freq: Every day | ORAL | Status: DC
  Administered 2023-01-03 – 2023-01-09 (×7): 400 mg via ORAL
  Filled 2023-01-02 (×7): qty 1

## 2023-01-02 NOTE — Progress Notes (Signed)
Recreational Therapy Session Note  Patient Details  Name: Temisha Sabin MRN: [id number] Date of Birth: 1980-12-08 Today's Date: 01/02/2023  Pain: no c/o Skilled Therapeutic Interventions/Progress Updates: Met briefly with pt to discuss the purpose of an outing/community reintegration and potential goals.  Pt agreeable to participate in an outing to [employer] tomorrow.   Maddalynn Barnard 01/02/2023, 2:12 PM

## 2023-01-02 NOTE — Progress Notes (Signed)
Physical Therapy Session Note  Patient Details  Name: Lisa Werner MRN: [id number] Date of Birth: [birth year]  Today's Date: 01/02/2023 PT Individual Time: 0935-1005 PT Individual Time Calculation (min): 30 min   Short Term Goals: Week 2:  PT Short Term Goal 1 (Week 2): =LTGs d/t ELOS  Skilled Therapeutic Interventions/Progress Updates: Tx1: Pt presented in bed sleeping but easily aroused and agreeable to therapy. Pt denies pain during session. Completed bed mobility with supervision and use of bed features. Pt attempting to don shoes (over grip socks), PTA obtained regular socks and with increased  time/effort pt was able to don with minA without use of sock aide. Pt then donned shoes requiring increased assist from PTA to pull up over heels. Pt changed position between placing LE in figure four position and bending over to feet with pt able to maintain balance throughout. Pt then indicated need for bathroom. Ambulated without AD but intermittent reaching for wall/furniture for support. Completed toilet transfers with supervision and pt with continent urinary void/BM. Once completed performed hand hygiene at sink and ambulated to EOB. Pt agreeable to work on ambulation without AD. Ambulated to main gym with CGA then with fatigue minA. Pt noted to have increased R lateral lean and shortened R step length. In gym PTA donned 4lb weighted cuff to RLE for increased proprioceptive recruitment as well as increased forced use of RLE. Pt then ambulated back to room requiring overall minA however noted decreased R lateral lean and improved step through of RLE. In room pt returned to bed and left with bed alarm on, call bell within reach and needs met.   Tx2:Pt presented in bed agreeable to therapy. Pt denies pain at rest. Session focused on gait endurance, and education in preparation for outing tomorrow. Pt completed bed mobility with supervision and use of bed features. PTA donned shoes for time  management. Pt provided with RW and ambulated to [facility] entrance with CGA. Pt encouraged to use R hand to hit appropriate buttons on elevator. Pt also cued to attempt to maintain upright posture and monitor pacing due to when pt ambulates at slightly slower speed less deviations noted. Pt sat at bench and discussed any potential hurdles upon d/c. Discussed with pt progression of recovery (6 mos +), increased risk of additional CVA, and energy conservation. Pt also worked on stair training ascending/descending x 4 steps using L rail. Pt was able to perform with reciprocal pattern however noted increased R knee instability. Continued education with pt regarding safe practice with activities (initially performing stairs with more step to vs step through). With pt verbalizing understanding. Pt then ambulated back to [facility] entrance however due to incline required seated rest break after entering atrium. After seated rest, pt ambulated back to room with RW and CGA and in room returned to bed. In room pt left in bed at end of session with bed alarm on, call bell within reach and needs met.      Therapy Documentation Precautions:  Precautions Precautions: Fall Precaution Comments: R hemi, inattention Restrictions Weight Bearing Restrictions: No General:   Vital Signs:   Pain: Pain Assessment Pain Scale: 0-10 Pain Score: 0-No pain Mobility:   Locomotion :    Trunk/Postural Assessment :    Balance:   Exercises:   Other Treatments:      Therapy/Group: Individual Therapy  Ammaar Encina 01/02/2023, 12:52 PM

## 2023-01-02 NOTE — Progress Notes (Signed)
PROGRESS NOTE   Subjective/Complaints:  Pt up at sink with OT washing up. No complaints this morning. She reported feeling depressed to sw yesterday. Doesn't want to be on meds.   ROS: Patient denies fever, rash, sore throat, blurred vision, dizziness, nausea, vomiting, diarrhea, cough, shortness of breath or chest pain, joint or back/neck pain, headache    Objective:   No results found. Recent Labs    12/31/22 0629  WBC 13.4*  HGB 12.5  HCT 39.8  PLT 342   Recent Labs    12/31/22 0629  NA 132*  K 4.2  CL 99  CO2 24  GLUCOSE 231*  BUN 19  CREATININE 1.00  CALCIUM 8.7*     Intake/Output Summary (Last 24 hours) at 01/02/2023 0800 Last data filed at 01/01/2023 1751 Gross per 24 hour  Intake 240 ml  Output --  Net 240 ml        Physical Exam: Vital Signs Blood pressure 121/72, pulse (!) 102, temperature 97.8 F (36.6 C), temperature source Oral, resp. rate 17, height 5\' 2"  (1.575 m), weight 93.8 kg, SpO2 97%.   Constitutional: No distress . Vital signs reviewed. HEENT: NCAT, EOMI, oral membranes moist Neck: supple Cardiovascular: RRR without murmur. No JVD    Respiratory/Chest: CTA Bilaterally without wheezes or rales. Normal effort    GI/Abdomen: BS +, non-tender, non-distended Ext: no clubbing, cyanosis, or edema Psych: pleasant and cooperative  Skin: No evidence of breakdown, no evidence of rash Neuro: Alert and oriented x 3, she is able to provide a clear and coherent history, follows commands, cranial nerves II through XII intact besides shoulder shrug is a little decreased on the right, able to name and repeat, no dysarthria or aphasia noted RUE: 4-/5 Deltoid, 4-/5 Biceps, 4-/5 Triceps,  4-/5 Grip-stable--avoids using right side at times LUE: 5/5 Deltoid, 5/5 Biceps, 5/5 Triceps, 5/5 Wrist Ext, 5/5 Grip RLE: HF 4-/5, KE 4-/5, ADF 4-/5, APF 4-/5--stable appearance LLE: HF 5/5, KE 5/5, ADF 5/5, APF  5/5 Sensory exam normal for light touch and pain in all 4 limbs.  No abnormal tone appreciated. Good standing balance.     Assessment/Plan: 1. Functional deficits which require 3+ hours per day of interdisciplinary therapy in a comprehensive inpatient rehab setting. Physiatrist is providing close team supervision and 24 hour management of active medical problems listed below. Physiatrist and rehab team continue to assess barriers to discharge/monitor patient progress toward functional and medical goals  Care Tool:  Bathing    Body parts bathed by patient: Right arm, Left arm, Chest, Front perineal area, Abdomen, Face, Right upper leg, Left upper leg   Body parts bathed by helper: Buttocks, Left lower leg, Right lower leg     Bathing assist Assist Level: Minimal Assistance - Patient > 75%     Upper Body Dressing/Undressing Upper body dressing   What is the patient wearing?: Bra, Pull over shirt    Upper body assist Assist Level: Minimal Assistance - Patient > 75%    Lower Body Dressing/Undressing Lower body dressing      What is the patient wearing?: Underwear/pull up, Pants     Lower body assist Assist for lower body  dressing: Moderate Assistance - Patient 50 - 74%     Toileting Toileting    Toileting assist Assist for toileting: Contact Guard/Touching assist     Transfers Chair/bed transfer  Transfers assist     Chair/bed transfer assist level: Contact Guard/Touching assist     Locomotion Ambulation   Ambulation assist      Assist level: Contact Guard/Touching assist Assistive device: Walker-rolling Max distance: 350   Walk 10 feet activity   Assist     Assist level: Contact Guard/Touching assist Assistive device: Walker-rolling   Walk 50 feet activity   Assist    Assist level: Contact Guard/Touching assist Assistive device: Walker-rolling    Walk 150 feet activity   Assist Walk 150 feet activity did not occur: Safety/medical  concerns  Assist level: Contact Guard/Touching assist Assistive device: Walker-rolling    Walk 10 feet on uneven surface  activity   Assist Walk 10 feet on uneven surfaces activity did not occur: Safety/medical concerns   Assist level: Contact Guard/Touching assist Assistive device: Walker-rolling   Wheelchair     Assist Is the patient using a wheelchair?: Yes Type of Wheelchair: Manual    Wheelchair assist level: Dependent - Patient 0%      Wheelchair 50 feet with 2 turns activity    Assist        Assist Level: Dependent - Patient 0%   Wheelchair 150 feet activity     Assist      Assist Level: Dependent - Patient 0%   Blood pressure 121/72, pulse (!) 102, temperature 97.8 F (36.6 C), temperature source Oral, resp. rate 17, height 5\' 2"  (1.575 m), weight 93.8 kg, SpO2 97%.   Medical Problem List and Plan: 1. 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  Antithrombotics: -DVT/anticoagulation:  Pharmaceutical: Lovenox 40mg  daily             -antiplatelet therapy: DAPT X 3 weeks followed by ASA alone 3. Pain Management:  N/A 4. Mood/Behavior/Sleep: LCSW to follow for evaluation and support             -antipsychotic agents: N/A  -Melatonin PRN  -9/4 pt does not want to be on antidepressant   -request neuropsych input   -team continues to provide egosupport 5. Neuropsych/cognition: This patient is capable of making decisions on her own behalf. 6. Skin/Wound Care: Routine pressure relief measures.  7. Fluids/Electrolytes/Nutrition: Monitor I/O. Check CMET in am.  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 On Tresiba 16 units w/ Novolog 20 units TID ac/ Endo @ [facility]             --Continue Insulin gargline 30 units with 15 units TID ac --Monitor BS  ac/hs and continue to educate on compliance, risk factors/benefits. She didn't take insulin consistently and does not wear CGM due to "excessive beeping" -8/27- CBGs 195-221- will increase Semglee to 33 units daily- first dose in AM -8/28- CBGs 182 this AM- but hasn't started new dosing of Semglee- will monitor for trend.  -8/29- CBGs 160-278- worse this AM- will increase Semglee to 37 units in AM- won't get until tomorrow AM -8/30- CBGs better today- in low to mid 200s- down from  300s- con't regimen but will need changes this weekend -12/29/22 CBGs 220s-280s, overall better, just started increased dose yesterday; would monitor for trend before more adjustments made 9/2- CBGs 227-395- eating a LOT of snacks- will increase Semglee to 40 units daily- I don't think we will get good control- she just eats more snacks- and nursing and I have spoken with her- she has no desire to get better control.  CBG (last 3)  Recent Labs    01/01/23 1649 01/01/23 2045 01/02/23 0608  GLUCAP 165* 250* 297*     9/4 Semglee just increased to 45U  -obsv today. Dietary education important also  9. HTN: Monitor BP TID- continue to hold Lisinopril and hydrochlorothiazide.  Long-term goal normotensive --BP controlled currently  -9/4 BP controlled  Vitals:   12/29/22 0343 12/29/22 1436 12/29/22 1939 12/30/22 0346  BP: 118/88 122/70 (!) 141/96 110/80   12/30/22 1514 12/30/22 1918 12/31/22 0440 12/31/22 1340  BP: 123/84 111/69 (!) 133/91 125/83   12/31/22 1832 01/01/23 0518 01/01/23 1336 01/02/23 0555  BP: 114/82 115/66 106/73 121/72    10. Hyponatremia: Question chronic @131 -132 range per chart review             --recheck BMET in am   8/27- Na 135 11. Hyperlipidemia: Trig-245/LDL-180. Now on increased dose Crestor 40 mg.  Dietary education 12. Hypomagnesemia: Recheck in am. Now on Mag ox 400mg  daily. 8/27- Will check Mg in AM  8/28- mg hasn't bene done- not clear if pt refused labs or what.  8/29- Mg up to 2.0-  came back at 18:56am 13. Chronic leucocytosis: Trend WBC and monitor for signs of infection.              --improved from 13.4 to 11.9  8/27- will check U/ And Cx and CXR because up to 16k 8/28- U/ A negative- has large bacteria, which could be colonization but otherwise negative- CXR negative.   8/29- feels well- will recheck in AM just to monitor and weekly  8/30- down to 12.4  9/2- 13.4k- con't to monitor weekly 14. Morbid obesity: BMI- 39.4. CM/HH diet. RD consulted to review diet.  --Educate patient on importance of weight loss and exercise to help promote health, improve AIC and mobility.  15. Constipation: Declines need for laxative. Advised that prns on board. Monitor for now.  8/27- changed/added Senna-S 1 tab at bedtime-  LBM 9/1         LOS: 9 days A FACE TO FACE EVALUATION WAS PERFORMED  Ranelle Oyster 01/02/2023, 8:00 AM

## 2023-01-02 NOTE — Progress Notes (Signed)
Occupational Therapy Session Note  Patient Details  Name: [name] MRN: [id number] Date of Birth: Mar 11, 1981  Today's Date: 01/02/2023 OT Individual Time: 0272-5366 OT Individual Time Calculation (min): 75 min    Short Term Goals: Week 2:  OT Short Term Goal 1 (Week 2): STG=LTG 2/2 ELOS  Skilled Therapeutic Interventions/Progress Updates:    Pt resting in bed upon arrival. Pt declined bathing/dressing this morning. Amb with RW to sink and stood at sink to brush teeth and wash face. Amb with RW to gym at [facility]. No LOB noted. OT intervention with focus on RUE [facility] and [facility] tasks to increase independence with BADLs. Pt issued yellow theraputty with small beads imbedded. Pt used RUE to retrieve small beads and replace in putty.  The following assessments were completed:  9 Hole Peg Test is used to measure finger dexterity in pts with various neurological diagnoses. - Instructions The pt was instructed to pick up the pegs one at a time, using their dominant hand first and put them into the holes in any order until the holes were all filled. The pt then removed the pegs one at a time and returned them to the container. Both hands were tested separately.  - Results The pt completed the test in 56.85 seconds for RUE and 26.42 for LUE. Scores are based on the time taken to complete the activity. The timer started the moment the pt touched the first peg until the moment the last peg hit the container.   - Norms for healthy females ages 15-70+ 56-55 R 17.38 L 18.92 56-60 R 17.86 L 19.48 61-65 R 18.99 L 20.33 66-70 R 19.90 L 21.44 71+ R 22.49 L 24.11   Box and Blocks Test measures unilateral gross manual dexterity. - Instructions The pt was instructed to carry one block over at a time and go as quickly as they could, making sure their fingertips crossed the partition. One minute was given to complete the task per UE. The pt was allowed a 15-second trial period prior to  testing if needed. - Results The pt transferred 29 blocks with the R hand and 49 with the L hand. The total number of blocks carried from one compartment to the other in one minute is scored per hand. Higher scores on the test indicate better gross manual dexterity.  - Norms for adults females 50-75+ 50-54 R 77.7 L 74.3 55-59 R 74.7 L 73.6 60-64 R 76.1 L 73.6 65-69 R 72 L 71.3 70-74 R 68.6 L 68.3 75+ R 65.0 L 63.6  Pt amb with RW back to room and returned to recliner. All needs within reach. Belt alarm activated.     Therapy Documentation Precautions:  Precautions Precautions: Fall Precaution Comments: R hemi, inattention Restrictions Weight Bearing Restrictions: No   Pain: Pt denied pain this morning   Therapy/Group: Individual Therapy  Rich Brave 01/02/2023, 11:41 AM

## 2023-01-02 NOTE — Progress Notes (Addendum)
Speech Language Pathology Daily Session Note  Patient Details  Name: Demita Votto MRN: [id number] Date of Birth: 07-26-1980  Today's Date: 01/02/2023 SLP Individual Time: 6578-4696 SLP Individual Time Calculation (min): 60 min  Short Term Goals: Week 2: SLP Short Term Goal 1 (Week 2): STGs= LTGs due to ELOS  Skilled Therapeutic Interventions:  Pt was seen in am to address dysphagia management, cognitive re- training, and speech intelligibility. Pt was alert and seen at bedside upon SLP arrival. She was agreeable for ST session. Portion of session took place off unit, downstairs of hospital. Pt challenged to utilize speech intelligibility strategies to unfamiliar listeners in moderately noisy environment. Pt able to communicate wants to unfamiliar listeners warranting min cues to improve vocal intensity. Pt subsequently consumed thin liquids via straw. Instruction for small single sips with good return by pt and no s/sx pen/asp observed. SLP also challenging pt in mildly complex budgeting task at this time, with pt given a budget to plan meal for her and her son. Pt completed task with min A and assistance navigating restaurant menu. Upon return to unit SLP addressing recall of functional information. Pt able to discuss sparse details of recent medical hx. Given sup to min A, pt improved detail of narrative. SLP subsequently training pt in s/sx of a stroke via training in acronym BE FAST. SLP provided visual of acronym and discussed symptoms with pt able to provide additional insight. Pt returned to room and assisted to bathroom per her request. Min A needed for safety as pt observed standing before locking WC brakes, "flopping" on toilet and upon return to bed. Pt left in bed with call button within reach and bed alarm active. SLP notified pt's assigned nurse of c/o constipation and some blood in stool. SLP to continue POC.   Pain Pain Assessment Pain Scale: 0-10 Pain Score: 0-No  pain  Therapy/Group: Individual Therapy  Renaee Munda 01/02/2023, 11:08 AM

## 2023-01-02 NOTE — Progress Notes (Signed)
Occupational Therapy Weekly Progress Note  Patient Details  Name: Lisa Werner MRN: [id number] Date of Birth: Sep 02, 1980  Beginning of progress report period: December 25, 2022 End of progress report period: January 02, 2023  Patient has met 2 of 2 short term goals.  Pt made steady progress with BADLs and functional transfers during the past week. Pt is min A for bathing/dressing tasks with min verbal cues for safety awareness. Functional transfers with CGA using RW. Toileting with CGA.   Patient continues to demonstrate the following deficits: {impairments:3041632} and therefore will continue to benefit from skilled OT intervention to enhance overall performance with {ADL/iADL:3041649}.  Patient {LTG progression:3041653}.  {plan of NFAO:1308657}  OT Short Term Goals Week 1:  OT Short Term Goal 1 (Week 1): Pt will complete LB dressing Min A with AE as necessary OT Short Term Goal 1 - Progress (Week 1): Met OT Short Term Goal 2 (Week 1): Pt will complete toilet transfer CGA with only occasional cues for safety awareness OT Short Term Goal 2 - Progress (Week 1): Met Week 2:  OT Short Term Goal 1 (Week 2): STG=LTG 2/2 ELOS   Rich Brave 01/02/2023, 6:52 AM

## 2023-01-03 ENCOUNTER — Encounter (HOSPITAL_COMMUNITY): Payer: Self-pay | Admitting: Physical Medicine and Rehabilitation

## 2023-01-03 DIAGNOSIS — E1165 Type 2 diabetes mellitus with hyperglycemia: Secondary | ICD-10-CM | POA: Diagnosis not present

## 2023-01-03 DIAGNOSIS — F329 Major depressive disorder, single episode, unspecified: Secondary | ICD-10-CM | POA: Diagnosis not present

## 2023-01-03 DIAGNOSIS — I639 Cerebral infarction, unspecified: Secondary | ICD-10-CM | POA: Diagnosis not present

## 2023-01-03 DIAGNOSIS — I10 Essential (primary) hypertension: Secondary | ICD-10-CM | POA: Diagnosis not present

## 2023-01-03 LAB — GLUCOSE, CAPILLARY
Glucose-Capillary: 229 mg/dL — ABNORMAL HIGH (ref 70–99)
Glucose-Capillary: 236 mg/dL — ABNORMAL HIGH (ref 70–99)
Glucose-Capillary: 173 mg/dL — ABNORMAL HIGH (ref 70–99)
Glucose-Capillary: 216 mg/dL — ABNORMAL HIGH (ref 70–99)

## 2023-01-03 LAB — MAGNESIUM: Magnesium: 2 mg/dL (ref 1.7–2.4)

## 2023-01-03 MED ORDER — INSULIN GLARGINE-YFGN 100 UNIT/ML ~~LOC~~ SOLN
20.0000 [IU] | Freq: Every day | SUBCUTANEOUS | Status: DC
  Administered 2023-01-04 – 2023-01-07 (×4): 20 [IU] via SUBCUTANEOUS
  Filled 2023-01-03 (×5): qty 0.2

## 2023-01-03 MED ORDER — INSULIN GLARGINE-YFGN 100 UNIT/ML ~~LOC~~ SOLN: 15.0000 [IU] | Freq: Every day | SUBCUTANEOUS | Status: DC

## 2023-01-03 MED ORDER — INSULIN GLARGINE-YFGN 100 UNIT/ML ~~LOC~~ SOLN
10.0000 [IU] | Freq: Every day | SUBCUTANEOUS | Status: AC
  Administered 2023-01-03: 10 [IU] via SUBCUTANEOUS
  Filled 2023-01-03: qty 0.1

## 2023-01-03 MED ORDER — INSULIN GLARGINE-YFGN 100 UNIT/ML ~~LOC~~ SOLN
30.0000 [IU] | Freq: Every day | SUBCUTANEOUS | Status: DC
  Administered 2023-01-04 – 2023-01-07 (×4): 30 [IU] via SUBCUTANEOUS
  Filled 2023-01-03 (×4): qty 0.3

## 2023-01-03 NOTE — Progress Notes (Signed)
Speech Language Pathology Daily Session Note  Patient Details  Name: Lisa Werner MRN: [id number] Date of Birth: 02-06-1981  Today's Date: 01/03/2023 SLP Individual Time: 1300-1400 SLP Individual Time Calculation (min): 60 min  Short Term Goals: Week 2: SLP Short Term Goal 1 (Week 2): STGs= LTGs due to ELOS  Skilled Therapeutic Interventions: Skilled therapy session focused on cognitive goals. Upon SLP entrance, patient on the phone attempting to complete form online. SLP offered assistance and provided minA to aid patient in utilization of computer and email to complete requested task for work. Upon completion of task, SLP prompted patient to identify 10 items in which she thought would be in the hospital gift shop. Patient able to recall items and find independently upon gift shop entrance. During gift shop task, patient able to identify appropriate items to buy for a baby, child, self and adult, though noted impulsivity to pick an item without searching entire selection. Patient able to independently recall directions back to room, room number, floor and unit name. Patient left in bed with alarm set and call bell in reach. Continue POC.    Pain No pain reported   Therapy/Group: Individual Therapy  Ashleynicole Mcclees M.A., CF-SLP 01/03/2023, 3:06 PM

## 2023-01-03 NOTE — Progress Notes (Addendum)
PROGRESS NOTE   Subjective/Complaints:  Pt at eob. Had to change rooms because of water leak last night. I asked her how her mood was and she told me it was alright this morning. Happy to be going on outing today. Had questions about an insulin pump.  ROS: Patient denies fever, rash, sore throat, blurred vision, dizziness, nausea, vomiting, diarrhea, cough, shortness of breath or chest pain, joint or back/neck pain, headache .    Objective:   No results found. No results for input(s): "WBC", "HGB", "HCT", "PLT" in the last 72 hours.  No results for input(s): "NA", "K", "CL", "CO2", "GLUCOSE", "BUN", "CREATININE", "CALCIUM" in the last 72 hours.    Intake/Output Summary (Last 24 hours) at 01/03/2023 0854 Last data filed at 01/03/2023 0800 Gross per 24 hour  Intake 600 ml  Output --  Net 600 ml        Physical Exam: Vital Signs Blood pressure 123/88, pulse 97, temperature 97.9 F (36.6 C), temperature source Oral, resp. rate 18, height 5\' 2"  (1.575 m), weight 93.8 kg, SpO2 99%.   Constitutional: No distress . Vital signs reviewed. HEENT: NCAT, EOMI, oral membranes moist Neck: supple Cardiovascular: RRR without murmur. No JVD    Respiratory/Chest: CTA Bilaterally without wheezes or rales. Normal effort    GI/Abdomen: BS +, non-tender, non-distended Ext: no clubbing, cyanosis, or edema Psych: pleasant and cooperative, slightly flat but smiles  Skin: No evidence of breakdown, no evidence of rash Neuro: Alert and oriented x 3, she is able to provide a clear and coherent history, follows commands, cranial nerves II through XII intact besides shoulder shrug is a little decreased on the right, able to name and repeat, no dysarthria or aphasia noted RUE: 4-/5 Deltoid, 4-/5 Biceps, 4-/5 Triceps,  4-/5---exam consistent. LUE: 5/5 Deltoid, 5/5 Biceps, 5/5 Triceps, 5/5 Wrist Ext, 5/5 Grip RLE: HF 4-/5, KE 4-/5, ADF 4-/5, APF  4-/5--stable  LLE: HF 5/5, KE 5/5, ADF 5/5, APF 5/5 Sensory exam normal for light touch and pain in all 4 limbs.  No abnormal tone appreciated.       Assessment/Plan: 1. Functional deficits which require 3+ hours per day of interdisciplinary therapy in a comprehensive inpatient rehab setting. Physiatrist is providing close team supervision and 24 hour management of active medical problems listed below. Physiatrist and rehab team continue to assess barriers to discharge/monitor patient progress toward functional and medical goals  Care Tool:  Bathing    Body parts bathed by patient: Right arm, Left arm, Chest, Front perineal area, Abdomen, Face, Right upper leg, Left upper leg   Body parts bathed by helper: Buttocks, Left lower leg, Right lower leg     Bathing assist Assist Level: Minimal Assistance - Patient > 75%     Upper Body Dressing/Undressing Upper body dressing   What is the patient wearing?: Bra, Pull over shirt    Upper body assist Assist Level: Minimal Assistance - Patient > 75%    Lower Body Dressing/Undressing Lower body dressing      What is the patient wearing?: Underwear/pull up, Pants     Lower body assist Assist for lower body dressing: Moderate Assistance - Patient 50 - 74%  Toileting Toileting    Toileting assist Assist for toileting: Contact Guard/Touching assist     Transfers Chair/bed transfer  Transfers assist     Chair/bed transfer assist level: Contact Guard/Touching assist     Locomotion Ambulation   Ambulation assist      Assist level: Contact Guard/Touching assist Assistive device: Walker-rolling Max distance: 350   Walk 10 feet activity   Assist     Assist level: Contact Guard/Touching assist Assistive device: Walker-rolling   Walk 50 feet activity   Assist    Assist level: Contact Guard/Touching assist Assistive device: Walker-rolling    Walk 150 feet activity   Assist Walk 150 feet activity did  not occur: Safety/medical concerns  Assist level: Contact Guard/Touching assist Assistive device: Walker-rolling    Walk 10 feet on uneven surface  activity   Assist Walk 10 feet on uneven surfaces activity did not occur: Safety/medical concerns   Assist level: Contact Guard/Touching assist Assistive device: Walker-rolling   Wheelchair     Assist Is the patient using a wheelchair?: Yes Type of Wheelchair: Manual    Wheelchair assist level: Dependent - Patient 0%      Wheelchair 50 feet with 2 turns activity    Assist        Assist Level: Dependent - Patient 0%   Wheelchair 150 feet activity     Assist      Assist Level: Dependent - Patient 0%   Blood pressure 123/88, pulse 97, temperature 97.9 F (36.6 C), temperature source Oral, resp. rate 18, height 5\' 2"  (1.575 m), weight 93.8 kg, SpO2 99%.   Medical Problem List and Plan: 1. 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  Antithrombotics: -DVT/anticoagulation:  Pharmaceutical: Lovenox 40mg  daily             -antiplatelet therapy: DAPT X 3 weeks followed by ASA alone 3. Pain Management:  N/A 4. Mood/Behavior/Sleep: LCSW to follow for evaluation and support             -antipsychotic agents: N/A  -Melatonin PRN  -9/4-5 pt does not want to be on antidepressant--told me her mood was ok today   -requested neuropsych input   -team continues to provide egosupport 5. Neuropsych/cognition: This patient is capable of making decisions on her own behalf. 6. Skin/Wound Care: Routine pressure relief measures.  7. Fluids/Electrolytes/Nutrition: Monitor I/O. Check CMET in am.  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 On Tresiba 16 units w/ Novolog 20 units TID ac/ Endo @ [facility]             --Continue  Insulin gargline 30 units with 15 units TID ac --Monitor BS ac/hs and continue to educate on compliance, risk factors/benefits. She didn't take insulin consistently and does not wear CGM due to "excessive beeping" -8/27- CBGs 195-221- will increase Semglee to 33 units daily- first dose in AM -8/28- CBGs 182 this AM- but hasn't started new dosing of Semglee- will monitor for trend.  -8/29- CBGs 160-278- worse this AM- will increase Semglee to 37 units in AM- won't get until tomorrow AM -8/30- CBGs better today- in low to mid 200s- down from 300s- con't regimen but will need  changes this weekend -12/29/22 CBGs 220s-280s, overall better, just started increased dose yesterday; would monitor for trend before more adjustments made 9/2- CBGs 227-395- eating a LOT of snacks- will increase Semglee to 40 units daily- I don't think we will get good control- she just eats more snacks- and nursing and I have spoken with her- she has no desire to get better control.  CBG (last 3)  Recent Labs    01/02/23 1647 01/02/23 2024 01/03/23 0633  GLUCAP 161* 268* 229*     9/4 Semglee just increased to 45U  -obsv today. Dietary education important also   9/5 will change semglee to 30u qam and 20u at bedtime--ultimately, tonight only 10u to start  -continue same novolog 18u tid with meals  -discussed the process of evaluation for pump. Talked a little about how pump would work as well  9. HTN: Monitor BP TID- continue to hold Lisinopril and hydrochlorothiazide.  Long-term goal normotensive --BP controlled currently  -9/5 BP controlled  Vitals:   12/30/22 0346 12/30/22 1514 12/30/22 1918 12/31/22 0440  BP: 110/80 123/84 111/69 (!) 133/91   12/31/22 1340 12/31/22 1832 01/01/23 0518 01/01/23 1336  BP: 125/83 114/82 115/66 106/73   01/02/23 0555 01/02/23 1418 01/02/23 1934 01/03/23 0323  BP: 121/72 121/81 121/81 123/88    10. Hyponatremia: Question chronic @131 -132 range per chart review             --recheck BMET  in am   8/27- Na 135 11. Hyperlipidemia: Trig-245/LDL-180. Now on increased dose Crestor 40 mg.  Dietary education 12. Hypomagnesemia: Recheck in am. Now on Mag ox 400mg  daily. 8/27- Will check Mg in AM  8/28- mg hasn't bene done- not clear if pt refused labs or what.  8/29- Mg up to 2.0- came back at 18:56am 13. Chronic leucocytosis: Trend WBC and monitor for signs of infection.              --improved from 13.4 to 11.9  8/27- will check U/ And Cx and CXR because up to 16k 8/28- U/ A negative- has large bacteria, which could be colonization but otherwise negative- CXR negative.   8/29- feels well- will recheck in AM just to monitor and weekly  8/30- down to 12.4  9/2- 13.4k- con't to monitor weekly 14. Morbid obesity: BMI- 39.4. CM/HH diet. RD consulted to review diet.  --Educate patient on importance of weight loss and exercise to help promote health, improve AIC and mobility.  15. Constipation: Declines need for laxative. Advised that prns on board. Monitor for now.   - Senna-S 2 tab at bedtime-  LBM 9/4         LOS: 10 days A FACE TO FACE EVALUATION WAS PERFORMED  Ranelle Oyster 01/03/2023, 8:54 AM

## 2023-01-03 NOTE — Progress Notes (Signed)
Occupational Therapy Note  Patient Details  Name: Lisa Werner MRN: [id number] Date of Birth: 1980-07-06  Today's Date: 01/03/2023 OT Missed Time: 60 Minutes Missed Time Reason: Patient fatigue;Patient unwilling/refused to participate without medical reason  Pt sleeping upon arrival. Min verbal cues to arouse. Pt declined therapy this morning. Pt wanted to take a shower but preferred female staff to assist. Pt missed 60 mins skilled OT services. Informed NT on unit of pt's request.     Rich Brave 01/03/2023, 10:46 AM

## 2023-01-03 NOTE — Progress Notes (Signed)
Physical Therapy Session Note  Patient Details  Name: Lisa Werner MRN: [id number] Date of Birth: 21-Dec-1980  Today's Date: 01/03/2023 PT Individual Time: 1030-1155 PT Individual Time Calculation (min): 85 min   Short Term Goals: Week 2:  PT Short Term Goal 1 (Week 2): =LTGs d/t ELOS  Skilled Therapeutic Interventions/Progress Updates: Pt presented EOB agreeable to therapy. Session focused on functional mobility in community setting. Pt transported down to employee entrance for energy conservation. Pt required CGA for ascending stairs in [name], pt ascended reciprocal pattern with pt unable to fully extend RLE. Providing education on importance at this time to perform step to to avoid falls. When descending stairs when leaving van pt instructed to to step to with improved technique (last stair 11 in so reinforced for safety). In store pt was able to close RW (with increased time) and place RW in shopping cart. Pt then ambulated throughout store holding shopping cart for increased endurance with therapist and lisa, RT with discussion on energy conservation, opportunities for resting (shoe dept, pharmacy, etc). PTA observed increased fatigue throughout session but no overt LOB. Pt was able to ascend stairs in [name] with step to pattern but evident fatigue. Upon return to hospital pt transported to [facility] with continued conversation/education on bathrooms, restaurants, and safety concerns when visiting these places. Pt transported back to room and completed ambulatory transfer back to EOB. Pt left seated EOB at end of session with bed alarm on, call bell within reach and needs met.      Therapy Documentation Precautions:  Precautions Precautions: Fall Precaution Comments: R hemi, inattention Restrictions Weight Bearing Restrictions: No General:   Vital Signs: Therapy Vitals Temp: 98.2 F (36.8 C) Temp Source: Oral Pulse Rate: 88 Resp: 18 BP: 128/85 Patient Position (if appropriate):  Lying Oxygen Therapy SpO2: 99 % O2 Device: Room Air Pain:   Mobility:   Locomotion :    Trunk/Postural Assessment :    Balance:   Exercises:   Other Treatments:      Therapy/Group: Individual Therapy  Zaynah Chawla 01/03/2023, 2:39 PM

## 2023-01-03 NOTE — Progress Notes (Signed)
Recreational Therapy Session Note  Patient Details  Name: Lisa Werner MRN: [id number] Date of Birth: [birth year] Today's Date: 01/03/2023 1030-12 Pain: no c/o Skilled Therapeutic Interventions/Progress Updates:  Pt participated in Community reintegration/outing to [employer] at overall close supervision ambulatory level using RW or rolling cart. Goals focused on safe community mobility, identification & negotiation of obstacles, accessing public restroom, energy conservation techniques/education.  Pt ambulated throughout the store and parking lot without seated rest breaks, but did display mild LOB/staggering gait at times.  Discussed options for energy conservation including use of motorized cart, taking seated rest breaks/educating pt on options for seated breaks.  See outing goal sheet in shadow chart for full details.  Therapy/Group: [employer]   Sevastian Witczak 01/03/2023, 12:08 PM

## 2023-01-04 DIAGNOSIS — E1165 Type 2 diabetes mellitus with hyperglycemia: Secondary | ICD-10-CM | POA: Diagnosis not present

## 2023-01-04 DIAGNOSIS — F329 Major depressive disorder, single episode, unspecified: Secondary | ICD-10-CM | POA: Diagnosis not present

## 2023-01-04 DIAGNOSIS — I639 Cerebral infarction, unspecified: Secondary | ICD-10-CM | POA: Diagnosis not present

## 2023-01-04 DIAGNOSIS — I10 Essential (primary) hypertension: Secondary | ICD-10-CM | POA: Diagnosis not present

## 2023-01-04 LAB — GLUCOSE, CAPILLARY
Glucose-Capillary: 171 mg/dL — ABNORMAL HIGH (ref 70–99)
Glucose-Capillary: 180 mg/dL — ABNORMAL HIGH (ref 70–99)
Glucose-Capillary: 175 mg/dL — ABNORMAL HIGH (ref 70–99)
Glucose-Capillary: 276 mg/dL — ABNORMAL HIGH (ref 70–99)

## 2023-01-04 NOTE — Progress Notes (Signed)
Speech Language Pathology Daily Session Note  Patient Details  Name: Lisa Werner MRN: [id number] Date of Birth: 05-26-1980  Today's Date: 01/04/2023 SLP Individual Time: 1400-1500 SLP Individual Time Calculation (min): 60 min  Short Term Goals: Week 2: SLP Short Term Goal 1 (Week 2): STGs= LTGs due to ELOS  Skilled Therapeutic Interventions: Skilled therapy session focused on cognitive goals. Patient transferred outside via [facility] to complete session. SLP facilitated session by providing supervision A during completion of lesson plan activity. 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 Upon completion of lesson plan, patient utilized computer with minA to navigate to youtube to share music video she created. Patient with occasionally misspelled words without correction, requiring cues to revisit. Upon return to room, patient able to independently recall all speech intelligibility strategies. Patient left in bed with alarm set and call bell in reach. Continue POC.    Pain No pain reported  Therapy/Group: Individual Therapy  Mukesh Kornegay M.A., CF-SLP 01/04/2023, 3:39 PM

## 2023-01-04 NOTE — Progress Notes (Signed)
Resting, states "it was a long day today, enjoy being away for awhile'.Verbalized she somewhat tired and just want to rest tonight. Denies discomfort or pain, po liquids consuming well.

## 2023-01-04 NOTE — Progress Notes (Signed)
PROGRESS NOTE   Subjective/Complaints:  Pt up in shower. Sounds as if outing went well yesterday. No new complaints today  ROS: Patient denies fever, rash, sore throat, blurred vision, dizziness, nausea, vomiting, diarrhea, cough, shortness of breath or chest pain, joint or back/neck pain, headache, or mood change.     Objective:   No results found. No results for input(s): "WBC", "HGB", "HCT", "PLT" in the last 72 hours.  No results for input(s): "NA", "K", "CL", "CO2", "GLUCOSE", "BUN", "CREATININE", "CALCIUM" in the last 72 hours.    Intake/Output Summary (Last 24 hours) at 01/04/2023 0916 Last data filed at 01/03/2023 2111 Gross per 24 hour  Intake 760 ml  Output --  Net 760 ml        Physical Exam: Vital Signs Blood pressure 109/75, pulse 99, temperature 98.2 F (36.8 C), temperature source Oral, resp. rate 19, height 5\' 2"  (1.575 m), weight 93.8 kg, SpO2 97%.   Constitutional: No distress . Vital signs reviewed. HEENT: NCAT, EOMI, oral membranes moist Neck: supple Cardiovascular: RRR without murmur. No JVD    Respiratory/Chest: CTA Bilaterally without wheezes or rales. Normal effort    GI/Abdomen: BS +, non-tender, non-distended Ext: no clubbing, cyanosis, or edema Psych: pleasant and cooperative, still somewhat flat Skin: No evidence of breakdown, no evidence of rash Neuro: Alert and oriented x 3, she is able to provide a clear and coherent history, follows commands, cranial nerves II through XII intact besides shoulder shrug is a little decreased on the right, able to name and repeat, no dysarthria or aphasia noted RUE: 4-/5, RLE 4- to 4/5. LUE and LLE 5/5.  Sensory exam normal for light touch and pain in all 4 limbs.  No abnormal tone appreciated.       Assessment/Plan: 1. Functional deficits which require 3+ hours per day of interdisciplinary therapy in a comprehensive inpatient rehab  setting. Physiatrist is providing close team supervision and 24 hour management of active medical problems listed below. Physiatrist and rehab team continue to assess barriers to discharge/monitor patient progress toward functional and medical goals  Care Tool:  Bathing    Body parts bathed by patient: Right arm, Left arm, Chest, Front perineal area, Abdomen, Face, Right upper leg, Left upper leg   Body parts bathed by helper: Buttocks, Left lower leg, Right lower leg     Bathing assist Assist Level: Minimal Assistance - Patient > 75%     Upper Body Dressing/Undressing Upper body dressing   What is the patient wearing?: Bra, Pull over shirt    Upper body assist Assist Level: Minimal Assistance - Patient > 75%    Lower Body Dressing/Undressing Lower body dressing      What is the patient wearing?: Underwear/pull up, Pants     Lower body assist Assist for lower body dressing: Moderate Assistance - Patient 50 - 74%     Toileting Toileting    Toileting assist Assist for toileting: Contact Guard/Touching assist     Transfers Chair/bed transfer  Transfers assist     Chair/bed transfer assist level: Contact Guard/Touching assist     Locomotion Ambulation   Ambulation assist      Assist level: Contact  Guard/Touching assist Assistive device: Walker-rolling Max distance: 350   Walk 10 feet activity   Assist     Assist level: Contact Guard/Touching assist Assistive device: Walker-rolling   Walk 50 feet activity   Assist    Assist level: Contact Guard/Touching assist Assistive device: Walker-rolling    Walk 150 feet activity   Assist Walk 150 feet activity did not occur: Safety/medical concerns  Assist level: Contact Guard/Touching assist Assistive device: Walker-rolling    Walk 10 feet on uneven surface  activity   Assist Walk 10 feet on uneven surfaces activity did not occur: Safety/medical concerns   Assist level: Contact  Guard/Touching assist Assistive device: Walker-rolling   Wheelchair     Assist Is the patient using a wheelchair?: Yes Type of Wheelchair: Manual    Wheelchair assist level: Dependent - Patient 0%      Wheelchair 50 feet with 2 turns activity    Assist        Assist Level: Dependent - Patient 0%   Wheelchair 150 feet activity     Assist      Assist Level: Dependent - Patient 0%   Blood pressure 109/75, pulse 99, temperature 98.2 F (36.8 C), temperature source Oral, resp. rate 19, height 5\' 2"  (1.575 m), weight 93.8 kg, SpO2 97%.   Medical Problem List and Plan: 1. 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  Antithrombotics: -DVT/anticoagulation:  Pharmaceutical: Lovenox 40mg  daily             -antiplatelet therapy: DAPT X 3 weeks followed by ASA alone 3. Pain Management:  N/A 4. Mood/Behavior/Sleep: LCSW to follow for evaluation and support             -antipsychotic agents: N/A  -Melatonin PRN  -9/6 pt does not want to be on antidepressant--mood seems a little better this week   -requested neuropsych input   -team continues to provide egosupport 5. Neuropsych/cognition: This patient is capable of making decisions on her own behalf. 6. Skin/Wound Care: Routine pressure relief measures.  7. Fluids/Electrolytes/Nutrition: Monitor I/O. Check CMET in am.  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 On Tresiba 16 units w/ Novolog 20 units TID ac/ Endo @ [facility]             --Continue Insulin gargline 30 units with 15 units TID ac --Monitor BS ac/hs and continue to educate on compliance, risk factors/benefits. She didn't take insulin consistently and does not wear CGM due to "excessive beeping" -8/27- CBGs 195-221- will increase Semglee to 33 units daily-  first dose in AM -8/28- CBGs 182 this AM- but hasn't started new dosing of Semglee- will monitor for trend.  -8/29- CBGs 160-278- worse this AM- will increase Semglee to 37 units in AM- won't get until tomorrow AM -8/30- CBGs better today- in low to mid 200s- down from 300s- con't regimen but will need changes this weekend -12/29/22 CBGs 220s-280s, overall better, just started increased dose yesterday; would monitor for trend before more adjustments made 9/2- CBGs 227-395- eating a LOT of snacks- will increase Semglee to 40 units daily- I don't think we will get good control- she just eats more snacks- and nursing and  I have spoken with her- she has no desire to get better control.  CBG (last 3)  Recent Labs    01/03/23 1712 01/03/23 2113 01/04/23 0600  GLUCAP 173* 216* 171*       9/6 semglee transitioned to 30u qam and 20 u qpm--fully effective today  -numbers showing improvement  -dietary ed 9. HTN: Monitor BP TID- continue to hold Lisinopril and hydrochlorothiazide.  Long-term goal normotensive --BP controlled currently  -9/5 BP controlled  Vitals:   12/31/22 0440 12/31/22 1340 12/31/22 1832 01/01/23 0518  BP: (!) 133/91 125/83 114/82 115/66   01/01/23 1336 01/02/23 0555 01/02/23 1418 01/02/23 1934  BP: 106/73 121/72 121/81 121/81   01/03/23 0323 01/03/23 1318 01/03/23 2012 01/04/23 0334  BP: 123/88 128/85 (!) 122/91 109/75    10. Hyponatremia: Question chronic @131 -132 range per chart review             --recheck BMET in am   8/27- Na 135 11. Hyperlipidemia: Trig-245/LDL-180. Now on increased dose Crestor 40 mg.  Dietary education 12. Hypomagnesemia: Recheck in am. Now on Mag ox 400mg  daily. 8/27- Will check Mg in AM  8/28- mg hasn't bene done- not clear if pt refused labs or what.  8/29- Mg up to 2.0-  13. Chronic leucocytosis: Trend WBC and monitor for signs of infection.              --improved from 13.4 to 11.9  8/27- will check U/ And Cx and CXR because up to 16k 8/28-  U/ A negative- has large bacteria, which could be colonization but otherwise negative- CXR negative.   8/29- feels well- will recheck in AM just to monitor and weekly  8/30- down to 12.4  9/2- 13.4k- labs pending for monday 14. Morbid obesity: BMI- 39.4. CM/HH diet. RD consulted to review diet.  --Educate patient on importance of weight loss and exercise to help promote health, improve AIC and mobility.  15. Constipation: Declines need for laxative. Advised that prns on board. Monitor for now.   - Senna-S 2 tab at bedtime-  LBM 9/4         LOS: 11 days A FACE TO FACE EVALUATION WAS PERFORMED  Ranelle Oyster 01/04/2023, 9:16 AM

## 2023-01-04 NOTE — Progress Notes (Signed)
Occupational Therapy Session Note  Patient Details  Name: Lisa Werner MRN: [id number] Date of Birth: Jul 17, 1980  Today's Date: 01/04/2023 OT Individual Time: 0805-0900 OT Individual Time Calculation (min): 55 min    Short Term Goals: Week 2:  OT Short Term Goal 1 (Week 2): STG=LTG 2/2 ELOS  Skilled Therapeutic Interventions/Progress Updates:   Pt completing breakfast upon OT arrival. Open to all am self care activity this session with shower requested. Encouraged pt to use R hand with min cues for utensil use during self feeding and decreased isolated hand and finger control noted. Pt amb with RW from EOB to and from bathroom with RW with CGA and min cues for graded control and general balance and safety. Min cues for sit to and from stand off and on toilet and TTB in stall shower. Pt required min cues to integrate R UE for ADL item use including, body wash, washcloth, clothing items and toothbrush manipulation. Pt with decreased strength for management of bra clasps to doff and don requiring mod A. After demo, pt able to load sock onto sock aide with increased time and effort and CGA and overall occasional min A for LB dressing at EOB. Amb to and from bed to sink with RW and CGA. Stood for oral care with CGA. Left bed level resting as per request with bed alarm set, needs and nurse call button in reach.   Pain: Denies all pain   Therapy Documentation Precautions:  Precautions Precautions: Fall Precaution Comments: R hemi, inattention Restrictions Weight Bearing Restrictions: No    Therapy/Group: Individual Therapy  Vicenta Dunning 01/04/2023, 7:27 AM

## 2023-01-04 NOTE — Progress Notes (Signed)
Physical Therapy Session Note  Patient Details  Name: Tsuneko Zhan MRN: [id number] Date of Birth: 22-Oct-1980  Today's Date: 01/04/2023 PT Individual Time: 1010-1123 PT Individual Time Calculation (min): 73 min   Short Term Goals: Week 2:  PT Short Term Goal 1 (Week 2): =LTGs d/t ELOS  Skilled Therapeutic Interventions/Progress Updates: Pt presented in bed agreeable to therapy. Pt denies pain during session. Completed bed mobility mod I. Donned shoes with set up. Pt ambulated to day room >313ft with RW and supervision. Pt continues to demonstrate mild decreased safety awareness and decreased stance time on RLE. At pat pt participated in balance/coordination/gait activities. Pt worked on coordination tapping colored dots on floor. When performed with LLE mild knee instability noted but no LOB. Pt also performed backwards walking 30ft x4 and side stepping 67ft x 4. Pt also participated in stepping over "thresholds" (hockey and shuffleboard sticks) x 20 ft  After first round PTA then added 2.5lb weight to RLE for increased proprioceptive input with pt demonstrating mild compensatory strategy performing circumduction but able to improve with mod multimodal cues.  Pt then participated in agility ladder performing "ins/outs" then "zig zags" leading with RLE with 2.5 lb cuff. Pt required intermittent tactile cues for RLE awareness however no LOB. Pt with noted fatigue and demonstrated improved awareness as noting increased rest break before ambulating back to room with RW and supervision. In room pt ambulated back to bed and transferred to supine mod I. Pt left in bed at end of session with bed alarm on, call bell within reach and needs met.      Therapy Documentation Precautions:  Precautions Precautions: Fall Precaution Comments: R hemi, inattention Restrictions Weight Bearing Restrictions: No   Therapy/Group: Individual Therapy  Dong Nimmons 01/04/2023, 12:34 PM

## 2023-01-05 DIAGNOSIS — I10 Essential (primary) hypertension: Secondary | ICD-10-CM | POA: Diagnosis not present

## 2023-01-05 DIAGNOSIS — I639 Cerebral infarction, unspecified: Secondary | ICD-10-CM | POA: Diagnosis not present

## 2023-01-05 DIAGNOSIS — R739 Hyperglycemia, unspecified: Secondary | ICD-10-CM | POA: Diagnosis not present

## 2023-01-05 LAB — GLUCOSE, CAPILLARY
Glucose-Capillary: 202 mg/dL — ABNORMAL HIGH (ref 70–99)
Glucose-Capillary: 223 mg/dL — ABNORMAL HIGH (ref 70–99)
Glucose-Capillary: 179 mg/dL — ABNORMAL HIGH (ref 70–99)
Glucose-Capillary: 250 mg/dL — ABNORMAL HIGH (ref 70–99)

## 2023-01-05 NOTE — Progress Notes (Signed)
Interaction this evening patient appearing disoriented and poor concentration. Responses delayed. Per Nurse Tech when assisting patient to bathroom having some difficulties with ADLS and needing assistance. Also, reported making incongruent responses to task she was completing. Affect flat/blunted.

## 2023-01-05 NOTE — Progress Notes (Signed)
PROGRESS NOTE   Subjective/Complaints:  Pt doing well, slept ok, denies pain, LBM yesterday, urinating fine (incontinent), denies any other complaints or concerns today.   ROS: Patient denies fever, rash, sore throat, blurred vision, dizziness, nausea, vomiting, diarrhea, cough, shortness of breath or chest pain, joint or back/neck pain, headache, or mood change.     Objective:   No results found. No results for input(s): "WBC", "HGB", "HCT", "PLT" in the last 72 hours.  No results for input(s): "NA", "K", "CL", "CO2", "GLUCOSE", "BUN", "CREATININE", "CALCIUM" in the last 72 hours.    Intake/Output Summary (Last 24 hours) at 01/05/2023 1059 Last data filed at 01/05/2023 0737 Gross per 24 hour  Intake 954 ml  Output --  Net 954 ml        Physical Exam: Vital Signs Blood pressure 116/75, pulse 89, temperature 97.6 F (36.4 C), temperature source Oral, resp. rate 17, height 5\' 2"  (1.575 m), weight 95 kg, SpO2 98%.   Constitutional: No distress . Vital signs reviewed. Laying in bed.  HEENT: NCAT, EOMI, oral membranes moist Neck: supple Cardiovascular: RRR without murmur. No JVD    Respiratory/Chest: CTA Bilaterally without wheezes or rales. Normal effort    GI/Abdomen: BS +, non-tender, non-distended Ext: no clubbing, cyanosis, or edema Psych: pleasant and cooperative, still somewhat flat Skin: No evidence of breakdown, no evidence of rash  PRIOR EXAMS: Neuro: Alert and oriented x 3, she is able to provide a clear and coherent history, follows commands, cranial nerves II through XII intact besides shoulder shrug is a little decreased on the right, able to name and repeat, no dysarthria or aphasia noted RUE: 4-/5, RLE 4- to 4/5. LUE and LLE 5/5.  Sensory exam normal for light touch and pain in all 4 limbs.  No abnormal tone appreciated.       Assessment/Plan: 1. Functional deficits which require 3+ hours per day of  interdisciplinary therapy in a comprehensive inpatient rehab setting. Physiatrist is providing close team supervision and 24 hour management of active medical problems listed below. Physiatrist and rehab team continue to assess barriers to discharge/monitor patient progress toward functional and medical goals  Care Tool:  Bathing    Body parts bathed by patient: Right arm, Left arm, Chest, Front perineal area, Abdomen, Face, Right upper leg, Left upper leg   Body parts bathed by helper: Buttocks, Left lower leg, Right lower leg     Bathing assist Assist Level: Minimal Assistance - Patient > 75%     Upper Body Dressing/Undressing Upper body dressing   What is the patient wearing?: Bra, Pull over shirt    Upper body assist Assist Level: Minimal Assistance - Patient > 75%    Lower Body Dressing/Undressing Lower body dressing      What is the patient wearing?: Underwear/pull up, Pants     Lower body assist Assist for lower body dressing: Moderate Assistance - Patient 50 - 74%     Toileting Toileting    Toileting assist Assist for toileting: Contact Guard/Touching assist     Transfers Chair/bed transfer  Transfers assist     Chair/bed transfer assist level: Contact Guard/Touching assist     Locomotion Ambulation  Ambulation assist      Assist level: Contact Guard/Touching assist Assistive device: Walker-rolling Max distance: 350   Walk 10 feet activity   Assist     Assist level: Contact Guard/Touching assist Assistive device: Walker-rolling   Walk 50 feet activity   Assist    Assist level: Contact Guard/Touching assist Assistive device: Walker-rolling    Walk 150 feet activity   Assist Walk 150 feet activity did not occur: Safety/medical concerns  Assist level: Contact Guard/Touching assist Assistive device: Walker-rolling    Walk 10 feet on uneven surface  activity   Assist Walk 10 feet on uneven surfaces activity did not occur:  Safety/medical concerns   Assist level: Contact Guard/Touching assist Assistive device: Walker-rolling   Wheelchair     Assist Is the patient using a wheelchair?: Yes Type of Wheelchair: Manual    Wheelchair assist level: Dependent - Patient 0%      Wheelchair 50 feet with 2 turns activity    Assist        Assist Level: Dependent - Patient 0%   Wheelchair 150 feet activity     Assist      Assist Level: Dependent - Patient 0%   Blood pressure 116/75, pulse 89, temperature 97.6 F (36.4 C), temperature source Oral, resp. rate 17, height 5\' 2"  (1.575 m), weight 95 kg, SpO2 98%.   Medical Problem List and Plan: 1. 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  Antithrombotics: -DVT/anticoagulation:  Pharmaceutical: Lovenox 40mg  daily             -antiplatelet therapy: DAPT X 3 weeks followed by ASA alone 3. Pain Management:  N/A 4. Mood/Behavior/Sleep: LCSW to follow for evaluation and support             -antipsychotic agents: N/A  -Melatonin PRN -9/6 pt does not want to be on antidepressant--mood seems a little better this week   -requested neuropsych input   -team continues to provide egosupport 5. Neuropsych/cognition: This patient is capable of making decisions on her own behalf. 6. Skin/Wound Care: Routine pressure relief measures.  7. Fluids/Electrolytes/Nutrition: Monitor I/O. Check CMET in am.  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 On Tresiba 16 units w/ Novolog 20 units TID ac/ Endo @ [facility]             --Continue Insulin gargline 30 units with 15 units TID ac --Monitor BS ac/hs and continue to educate on compliance, risk factors/benefits. She didn't take insulin consistently and does not wear CGM due to "excessive beeping" -8/27- CBGs  195-221- will increase Semglee to 33 units daily- first dose in AM -8/28- CBGs 182 this AM- but hasn't started new dosing of Semglee- will monitor for trend.  -8/29- CBGs 160-278- worse this AM- will increase Semglee to 37 units in AM- won't get until tomorrow AM -8/30- CBGs better today- in low to mid 200s- down from 300s- con't regimen but will need changes this weekend -12/29/22 CBGs 220s-280s, overall better, just started increased dose yesterday; would monitor for trend before more adjustments made 9/2- CBGs 227-395- eating a LOT of snacks- will increase Semglee to 40 units daily- I don't think we will get good  control- she just eats more snacks- and nursing and I have spoken with her- she has no desire to get better control.   9/6 semglee transitioned to 30u qam and 20 u qpm--fully effective today  -numbers showing improvement  -dietary ed -01/05/23 CBGs generally better, cont regimen CBG (last 3)  Recent Labs    01/04/23 1635 01/04/23 2118 01/05/23 0705  GLUCAP 175* 276* 202*       9. HTN: Monitor BP TID- continue to hold Lisinopril and hydrochlorothiazide.  Long-term goal normotensive --BP controlled currently  -01/05/23 BP controlled  Vitals:   01/01/23 0518 01/01/23 1336 01/02/23 0555 01/02/23 1418  BP: 115/66 106/73 121/72 121/81   01/02/23 1934 01/03/23 0323 01/03/23 1318 01/03/23 2012  BP: 121/81 123/88 128/85 (!) 122/91   01/04/23 0334 01/04/23 1255 01/04/23 2116 01/05/23 0408  BP: 109/75 108/75 (!) 124/91 116/75    10. Hyponatremia: Question chronic @131 -132 range per chart review             --recheck BMET in am   8/27- Na 135 11. Hyperlipidemia: Trig-245/LDL-180. Now on increased dose Crestor 40mg .   Dietary education 12. Hypomagnesemia: Recheck in am. Now on Mag ox 400mg  daily. 8/27- Will check Mg in AM  8/28- mg hasn't bene done- not clear if pt refused labs or what.  8/29- Mg up to 2.0-  13. Chronic leucocytosis: Trend WBC and monitor for signs of infection.               --improved from 13.4 to 11.9  8/27- will check U/ And Cx and CXR because up to 16k 8/28- U/ A negative- has large bacteria, which could be colonization but otherwise negative- CXR negative.   8/29- feels well- will recheck in AM just to monitor and weekly  8/30- down to 12.4  9/2- 13.4k- labs pending for monday 14. Morbid obesity: BMI- 39.4. CM/HH diet. RD consulted to review diet.  --Educate patient on importance of weight loss and exercise to help promote health, improve AIC and mobility.  15. Constipation: Declines need for laxative. Advised that prns on board. Monitor for now.   - Senna-S 2 tab at bedtime-  -LBM 01/05/23     LOS: 12 days A FACE TO FACE EVALUATION WAS PERFORMED  [address] Norina Cowper 01/05/2023, 10:59 AM

## 2023-01-06 DIAGNOSIS — R739 Hyperglycemia, unspecified: Secondary | ICD-10-CM | POA: Diagnosis not present

## 2023-01-06 DIAGNOSIS — I10 Essential (primary) hypertension: Secondary | ICD-10-CM | POA: Diagnosis not present

## 2023-01-06 DIAGNOSIS — I639 Cerebral infarction, unspecified: Secondary | ICD-10-CM | POA: Diagnosis not present

## 2023-01-06 LAB — GLUCOSE, CAPILLARY
Glucose-Capillary: 234 mg/dL — ABNORMAL HIGH (ref 70–99)
Glucose-Capillary: 258 mg/dL — ABNORMAL HIGH (ref 70–99)
Glucose-Capillary: 220 mg/dL — ABNORMAL HIGH (ref 70–99)
Glucose-Capillary: 373 mg/dL — ABNORMAL HIGH (ref 70–99)

## 2023-01-06 NOTE — Plan of Care (Signed)
  Problem: Consults Goal: RH STROKE PATIENT EDUCATION Description: See Patient Education module for education specifics  Outcome: Progressing   Problem: Coping: Goal: Will verbalize positive feelings about self Outcome: Progressing Goal: Will identify appropriate support needs Outcome: Progressing   Problem: Self-Care: Goal: Ability to participate in self-care as condition permits will improve Outcome: Progressing

## 2023-01-06 NOTE — Progress Notes (Signed)
Speech Language Pathology Daily Session Note  Patient Details  Name: Lisa Werner MRN: [id number] Date of Birth: 04-26-1981  Today's Date: 01/06/2023 SLP Individual Time: 1105-1200 SLP Individual Time Calculation (min): 55 min  Short Term Goals: Week 2: SLP Short Term Goal 1 (Week 2): STGs= LTGs due to ELOS  Skilled Therapeutic Interventions:  Pt was seen for skilled ST targeting cognitive-communication goals.  Pt was in bed upon therapist's arrival, awake, alert, and agreeable to participating in treatment.  Pt reports feeling she is near baseline for cognition and is pleased overall with her ability to maintain speech intelligibility across a variety of contexts.  During unstructured conversations with SLP, pt had slightly decreased vocal intensity which did not significantly impact her overall intelligibility.  In a structured verbal description task with increased background noise and visual barrier (therapist looking away from pt), pt was noted to more deliberately use an increased vocal intensity to maintain intelligibility in a more challenging context with mod I.  SLP also facilitated the session with a novel deductive reasoning puzzle targeting functional problem solving.  Pt was able to complete 3 out of 3  puzzles for 100% accuracy with no more than x1 supervision verbal cue to recognize and correct an error. Pt was left in bed with bed alarm set and all needs within reach.  Continue per current plan of care.    Pain Pain Assessment Pain Scale: 0-10 Pain Score: 0-No pain  Therapy/Group: Individual Therapy  Apphia Cropley, Melanee Spry 01/06/2023, 3:45 PM

## 2023-01-06 NOTE — Plan of Care (Signed)
  Problem: Consults Goal: RH STROKE PATIENT EDUCATION Description: See Patient Education module for education specifics  Outcome: Progressing   Problem: RH SAFETY Goal: RH STG ADHERE TO SAFETY PRECAUTIONS W/ASSISTANCE/DEVICE Description: STG Adhere to Safety Precautions With cues Assistance/Device. Outcome: Progressing   Problem: RH KNOWLEDGE DEFICIT Goal: RH STG INCREASE KNOWLEDGE OF DIABETES Description: Patient will be able to manage DM with medication and dietary modification using educational resources independently Outcome: Progressing

## 2023-01-06 NOTE — Progress Notes (Signed)
Physical Therapy Session Note  Patient Details  Name: Lisa Werner MRN: [id number] Date of Birth: 1980/06/24  Today's Date: 01/06/2023 PT Individual Time: 0803-0900 PT Individual Time Calculation (min): 57 min   Short Term Goals: Week 2:  PT Short Term Goal 1 (Week 2): =LTGs d/t ELOS  Skilled Therapeutic Interventions/Progress Updates: Pt presented in bed agreeable to therapy. Pt denies pain during session. Performed bed mobility mod I with use of bed features. Donned shoes with set up. Stood with supervision and use of RW, pt ambulated to main gym with RW and supervision. Discussed fall recovery and pt completed floor transfer with minA for positioning of RUE. Once RUE placed under pt, she was able to complete floor transfer with supervision and increased time. Pt then transferred to mat in quadruped position. With use of bench pt was able to complete mini "push ups" from elbow for increased RUE recruitment then with PTA guarding RUE pt reached out with LLE to tap therapist's hand. After rest break stood on Airex for increased challenge with PTA performing gentle nudges for increased ankle strategy recruitment. Pt also performed dynamic standing task while standing on Airex and reaching for red clothespins and placing on basketball net. Pt also participated in stepping over lower profile orange hurdles. Pt was able to complete with CGA however requiring minA for balance correction when completing turn. PTA providing stabilization at hips to decrease rotation as compensatory strategy. Pt also completed side stepping with green theraband at knees for increased glute med recruitment. Pt noted to have difficulty clearing R foot when stepping in both directions. Pt lastly worked on sidestepping over low hurdles with pt having x 1 LOB requiring minA for recovery. Pt required cues for improved posture and increasing step width to allow for appropriate placement of feet. After seated rest break pt  ambulated back to room with RW and supervision. Pt left seated EOB with nsg for ADL and am meds with current needs met.      Therapy Documentation Precautions:  Precautions Precautions: Fall Precaution Comments: R hemi, inattention Restrictions Weight Bearing Restrictions: No General:   Vital Signs:   Pain: Pain Assessment Pain Scale: 0-10 Pain Score: 0-No pain   Therapy/Group: Individual Therapy  Juliette Standre 01/06/2023, 12:42 PM

## 2023-01-06 NOTE — Progress Notes (Signed)
PROGRESS NOTE   Subjective/Complaints:  Pt doing well again this morning, slept well, denies pain, LBM yesterday, urinating fine (intermittently incontinent), denies any other complaints or concerns today. Working in therapy gym.   ROS: Patient denies fever, rash, sore throat, blurred vision, dizziness, nausea, vomiting, diarrhea, cough, shortness of breath or chest pain, joint or back/neck pain, headache, or mood change.     Objective:   No results found. No results for input(s): "WBC", "HGB", "HCT", "PLT" in the last 72 hours.  No results for input(s): "NA", "K", "CL", "CO2", "GLUCOSE", "BUN", "CREATININE", "CALCIUM" in the last 72 hours.    Intake/Output Summary (Last 24 hours) at 01/06/2023 0923 Last data filed at 01/06/2023 0700 Gross per 24 hour  Intake 713 ml  Output --  Net 713 ml        Physical Exam: Vital Signs Blood pressure 110/74, pulse (!) 107, temperature 98.1 F (36.7 C), temperature source Oral, resp. rate 14, height 5\' 2"  (1.575 m), weight 95.4 kg, SpO2 98%.   Constitutional: No distress . Vital signs reviewed. Sitting in therapy gym HEENT: NCAT, EOMI, oral membranes moist Neck: supple Cardiovascular: no tachycardia on exam. RRR without murmur. No JVD    Respiratory/Chest: CTA Bilaterally without wheezes or rales. Normal effort    GI/Abdomen: BS +, non-tender, non-distended Ext: no clubbing, cyanosis, or edema Psych: pleasant and cooperative, still somewhat flat Skin: No evidence of breakdown, no evidence of rash  PRIOR EXAMS: Neuro: Alert and oriented x 3, she is able to provide a clear and coherent history, follows commands, cranial nerves II through XII intact besides shoulder shrug is a little decreased on the right, able to name and repeat, no dysarthria or aphasia noted RUE: 4-/5, RLE 4- to 4/5. LUE and LLE 5/5.  Sensory exam normal for light touch and pain in all 4 limbs.  No abnormal tone  appreciated.       Assessment/Plan: 1. Functional deficits which require 3+ hours per day of interdisciplinary therapy in a comprehensive inpatient rehab setting. Physiatrist is providing close team supervision and 24 hour management of active medical problems listed below. Physiatrist and rehab team continue to assess barriers to discharge/monitor patient progress toward functional and medical goals  Care Tool:  Bathing    Body parts bathed by patient: Right arm, Left arm, Chest, Front perineal area, Abdomen, Face, Right upper leg, Left upper leg   Body parts bathed by helper: Buttocks, Left lower leg, Right lower leg     Bathing assist Assist Level: Minimal Assistance - Patient > 75%     Upper Body Dressing/Undressing Upper body dressing   What is the patient wearing?: Bra, Pull over shirt    Upper body assist Assist Level: Minimal Assistance - Patient > 75%    Lower Body Dressing/Undressing Lower body dressing      What is the patient wearing?: Underwear/pull up, Pants     Lower body assist Assist for lower body dressing: Moderate Assistance - Patient 50 - 74%     Toileting Toileting    Toileting assist Assist for toileting: Contact Guard/Touching assist     Transfers Chair/bed transfer  Transfers assist  Chair/bed transfer assist level: Contact Guard/Touching assist     Locomotion Ambulation   Ambulation assist      Assist level: Contact Guard/Touching assist Assistive device: Walker-rolling Max distance: 350   Walk 10 feet activity   Assist     Assist level: Contact Guard/Touching assist Assistive device: Walker-rolling   Walk 50 feet activity   Assist    Assist level: Contact Guard/Touching assist Assistive device: Walker-rolling    Walk 150 feet activity   Assist Walk 150 feet activity did not occur: Safety/medical concerns  Assist level: Contact Guard/Touching assist Assistive device: Walker-rolling    Walk 10 feet  on uneven surface  activity   Assist Walk 10 feet on uneven surfaces activity did not occur: Safety/medical concerns   Assist level: Contact Guard/Touching assist Assistive device: Walker-rolling   Wheelchair     Assist Is the patient using a wheelchair?: Yes Type of Wheelchair: Manual    Wheelchair assist level: Dependent - Patient 0%      Wheelchair 50 feet with 2 turns activity    Assist        Assist Level: Dependent - Patient 0%   Wheelchair 150 feet activity     Assist      Assist Level: Dependent - Patient 0%   Blood pressure 110/74, pulse (!) 107, temperature 98.1 F (36.7 C), temperature source Oral, resp. rate 14, height 5\' 2"  (1.575 m), weight 95.4 kg, SpO2 98%.   Medical Problem List and Plan: 1. 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  Antithrombotics: -DVT/anticoagulation:  Pharmaceutical: Lovenox 40mg  daily             -antiplatelet therapy: DAPT X 3 weeks followed by ASA alone 3. Pain Management:  N/A 4. Mood/Behavior/Sleep: LCSW to follow for evaluation and support             -antipsychotic agents: N/A  -Melatonin PRN -9/6 pt does not want to be on antidepressant--mood seems a little better this week   -requested neuropsych input   -team continues to provide egosupport 5. Neuropsych/cognition: This patient is capable of making decisions on her own behalf. 6. Skin/Wound Care: Routine pressure relief measures.  7. Fluids/Electrolytes/Nutrition: Monitor I/O. Check CMET in am.  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 On Tresiba 16 units w/ Novolog 20 units TID ac/ Endo @ [facility]             --Continue Insulin gargline 30 units with 15 units TID ac --Monitor BS ac/hs and continue to educate on compliance, risk factors/benefits.  She didn't take insulin consistently and does not wear CGM due to "excessive beeping" -8/27- CBGs 195-221- will increase Semglee to 33 units daily- first dose in AM -8/28- CBGs 182 this AM- but hasn't started new dosing of Semglee- will monitor for trend.  -8/29- CBGs 160-278- worse this AM- will increase Semglee to 37 units in AM- won't get until tomorrow AM -8/30- CBGs better today- in low to mid 200s- down from 300s- con't regimen but will need changes this weekend -12/29/22 CBGs 220s-280s, overall better, just started increased dose yesterday; would monitor for trend before more adjustments made 9/2- CBGs 227-395- eating a LOT  of snacks- will increase Semglee to 40 units daily- I don't think we will get good control- she just eats more snacks- and nursing and I have spoken with her- she has no desire to get better control.   9/6 semglee transitioned to 30u qam and 20 u qpm--fully effective today  -numbers showing improvement  -dietary ed -01/05/23 CBGs generally better, cont regimen -01/06/23 CBGs a little worse today, but just recently changed meds; might need further adjustment but will defer to weekday team CBG (last 3)  Recent Labs    01/05/23 1720 01/05/23 2203 01/06/23 0603  GLUCAP 179* 250* 234*       9. HTN: Monitor BP TID- continue to hold Lisinopril and hydrochlorothiazide.  Long-term goal normotensive --BP controlled currently  -01/05/23 BP controlled  -01/06/23 BP generally better, one isolated lower reading but better now.  Vitals:   01/03/23 1318 01/03/23 2012 01/04/23 0334 01/04/23 1255  BP: 128/85 (!) 122/91 109/75 108/75   01/04/23 2116 01/05/23 0408 01/05/23 1313 01/05/23 1451  BP: (!) 124/91 116/75 (!) 124/105 115/82   01/05/23 1928 01/06/23 0519 01/06/23 0532 01/06/23 0600  BP: 120/79 125/67 (!) 96/59 110/74    10. Hyponatremia: Question chronic @131 -132 range per chart review             --recheck BMET in am   8/27- Na 135 11. Hyperlipidemia: Trig-245/LDL-180. Now  on increased dose Crestor 40mg .   Dietary education 12. Hypomagnesemia: Recheck in am. Now on Mag ox 400mg  daily. 8/27- Will check Mg in AM  8/28- mg hasn't bene done- not clear if pt refused labs or what.  8/29- Mg up to 2.0-  13. Chronic leucocytosis: Trend WBC and monitor for signs of infection.              --improved from 13.4 to 11.9  8/27- will check U/ And Cx and CXR because up to 16k 8/28- U/ A negative- has large bacteria, which could be colonization but otherwise negative- CXR negative.   8/29- feels well- will recheck in AM just to monitor and weekly  8/30- down to 12.4  9/2- 13.4k- labs pending for monday 14. Morbid obesity: BMI- 39.4. CM/HH diet. RD consulted to review diet.  --Educate patient on importance of weight loss and exercise to help promote health, improve AIC and mobility.  15. Constipation: Declines need for laxative. Advised that prns on board. Monitor for now.   - Senna-S 2 tab at bedtime-  -LBM 01/05/23     LOS: 13 days A FACE TO FACE EVALUATION WAS PERFORMED  [address] 01/06/2023, 9:23 AM

## 2023-01-07 DIAGNOSIS — I639 Cerebral infarction, unspecified: Secondary | ICD-10-CM | POA: Diagnosis not present

## 2023-01-07 LAB — CBC
HCT: 36.3 % (ref 36.0–46.0)
Hemoglobin: 11.4 g/dL — ABNORMAL LOW (ref 12.0–15.0)
MCH: 28.4 pg (ref 26.0–34.0)
MCHC: 31.4 g/dL (ref 30.0–36.0)
MCV: 90.3 fL (ref 80.0–100.0)
Platelets: 355 10*3/uL (ref 150–400)
RBC: 4.02 MIL/uL (ref 3.87–5.11)
RDW: 12.9 % (ref 11.5–15.5)
WBC: 13.7 10*3/uL — ABNORMAL HIGH (ref 4.0–10.5)
nRBC: 0 % (ref 0.0–0.2)

## 2023-01-07 LAB — BASIC METABOLIC PANEL
Anion gap: 8 (ref 5–15)
BUN: 14 mg/dL (ref 6–20)
CO2: 24 mmol/L (ref 22–32)
Calcium: 8.9 mg/dL (ref 8.9–10.3)
Chloride: 102 mmol/L (ref 98–111)
Creatinine, Ser: 0.98 mg/dL (ref 0.44–1.00)
GFR, Estimated: >60 mL/min (ref >60)
Glucose, Bld: 210 mg/dL — ABNORMAL HIGH (ref 70–99)
Potassium: 4.2 mmol/L (ref 3.5–5.1)
Sodium: 134 mmol/L — ABNORMAL LOW (ref 135–145)

## 2023-01-07 LAB — GLUCOSE, CAPILLARY
Glucose-Capillary: 198 mg/dL — ABNORMAL HIGH (ref 70–99)
Glucose-Capillary: 189 mg/dL — ABNORMAL HIGH (ref 70–99)
Glucose-Capillary: 183 mg/dL — ABNORMAL HIGH (ref 70–99)
Glucose-Capillary: 175 mg/dL — ABNORMAL HIGH (ref 70–99)

## 2023-01-07 MED ORDER — INSULIN GLARGINE-YFGN 100 UNIT/ML ~~LOC~~ SOLN
33.0000 [IU] | Freq: Every day | SUBCUTANEOUS | Status: DC
  Administered 2023-01-08 – 2023-01-09 (×2): 33 [IU] via SUBCUTANEOUS
  Filled 2023-01-07 (×2): qty 0.33

## 2023-01-07 NOTE — Progress Notes (Signed)
PROGRESS NOTE   Subjective/Complaints:  Pt reports that only eating peanuts as snack even though CBGs running 198-373 in last 24 hours, in spite of continuing to increase insulin.   Pt reports LBM yesterday,   Asking about paperwork she had brought in from father.   ROS:  Pt denies SOB, abd pain, CP, N/V/C/D, and vision changes   Objective:   No results found. Recent Labs    01/07/23 0659  WBC 13.7*  HGB 11.4*  HCT 36.3  PLT 355    Recent Labs    01/07/23 0659  NA 134*  K 4.2  CL 102  CO2 24  GLUCOSE 210*  BUN 14  CREATININE 0.98  CALCIUM 8.9      Intake/Output Summary (Last 24 hours) at 01/07/2023 0833 Last data filed at 01/07/2023 0749 Gross per 24 hour  Intake 554 ml  Output --  Net 554 ml        Physical Exam: Vital Signs Blood pressure 117/83, pulse (!) 103, temperature (!) 97.5 F (36.4 C), temperature source Oral, resp. rate 18, height 5\' 2"  (1.575 m), weight 95.2 kg, SpO2 97%.    General: awake, alert, appropriate, supine in bed- but woke up for me today; NAD HENT: conjugate gaze; oropharynx moist CV: regular rhythm, slightly tachycardic ; no JVD Pulmonary: CTA B/L; no W/R/R- good air movement GI: soft, NT, ND, (+)BS Psychiatric: appropriate but flat Neurological: Ox3 but slightly delayed still  Ext: no clubbing, cyanosis, or edema Psych: pleasant and cooperative, still somewhat flat Skin: No evidence of breakdown, no evidence of rash  PRIOR EXAMS: Neuro: Alert and oriented x 3, she is able to provide a clear and coherent history, follows commands, cranial nerves II through XII intact besides shoulder shrug is a little decreased on the right, able to name and repeat, no dysarthria or aphasia noted RUE: 4-/5, RLE 4- to 4/5. LUE and LLE 5/5.  Sensory exam normal for light touch and pain in all 4 limbs.  No abnormal tone appreciated.       Assessment/Plan: 1. Functional deficits  which require 3+ hours per day of interdisciplinary therapy in a comprehensive inpatient rehab setting. Physiatrist is providing close team supervision and 24 hour management of active medical problems listed below. Physiatrist and rehab team continue to assess barriers to discharge/monitor patient progress toward functional and medical goals  Care Tool:  Bathing    Body parts bathed by patient: Right arm, Left arm, Chest, Front perineal area, Abdomen, Face, Right upper leg, Left upper leg   Body parts bathed by helper: Buttocks, Left lower leg, Right lower leg     Bathing assist Assist Level: Minimal Assistance - Patient > 75%     Upper Body Dressing/Undressing Upper body dressing   What is the patient wearing?: Bra, Pull over shirt    Upper body assist Assist Level: Minimal Assistance - Patient > 75%    Lower Body Dressing/Undressing Lower body dressing      What is the patient wearing?: Underwear/pull up, Pants     Lower body assist Assist for lower body dressing: Moderate Assistance - Patient 50 - 74%     Toileting Toileting  Toileting assist Assist for toileting: Contact Guard/Touching assist     Transfers Chair/bed transfer  Transfers assist     Chair/bed transfer assist level: Contact Guard/Touching assist     Locomotion Ambulation   Ambulation assist      Assist level: Contact Guard/Touching assist Assistive device: Walker-rolling Max distance: 350   Walk 10 feet activity   Assist     Assist level: Contact Guard/Touching assist Assistive device: Walker-rolling   Walk 50 feet activity   Assist    Assist level: Contact Guard/Touching assist Assistive device: Walker-rolling    Walk 150 feet activity   Assist Walk 150 feet activity did not occur: Safety/medical concerns  Assist level: Contact Guard/Touching assist Assistive device: Walker-rolling    Walk 10 feet on uneven surface  activity   Assist Walk 10 feet on uneven  surfaces activity did not occur: Safety/medical concerns   Assist level: Contact Guard/Touching assist Assistive device: Walker-rolling   Wheelchair     Assist Is the patient using a wheelchair?: Yes Type of Wheelchair: Manual    Wheelchair assist level: Dependent - Patient 0%      Wheelchair 50 feet with 2 turns activity    Assist        Assist Level: Dependent - Patient 0%   Wheelchair 150 feet activity     Assist      Assist Level: Dependent - Patient 0%   Blood pressure 117/83, pulse (!) 103, temperature (!) 97.5 F (36.4 C), temperature source Oral, resp. rate 18, height 5\' 2"  (1.575 m), weight 95.2 kg, SpO2 97%.   Medical Problem List and Plan: 1. 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  Antithrombotics: -DVT/anticoagulation:  Pharmaceutical: Lovenox 40mg  daily             -antiplatelet therapy: DAPT X 3 weeks followed by ASA alone 3. Pain Management:  N/A 4. Mood/Behavior/Sleep: LCSW to follow for evaluation and support             -antipsychotic agents: N/A  -Melatonin PRN -9/6 pt does not want to be on antidepressant--mood seems a little better this week   -requested neuropsych input   -team continues to provide ego support  9/9- seeing neuropsych today 5. Neuropsych/cognition: This patient is capable of making decisions on her own behalf. 6. Skin/Wound Care: Routine pressure relief measures.  7. Fluids/Electrolytes/Nutrition: Monitor I/O. Check CMET in am.  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 On Tresiba 16 units w/ Novolog 20 units TID ac/ Endo @ [facility]             --Continue Insulin gargline 30 units with 15 units TID ac --Monitor BS ac/hs and continue to educate on compliance, risk factors/benefits. She didn't take insulin consistently and does not wear  CGM due to "excessive beeping" -8/27- CBGs 195-221- will increase Semglee to 33 units daily- first dose in AM -8/28- CBGs 182 this AM- but hasn't started new dosing of Semglee- will monitor for trend.  -8/29- CBGs 160-278- worse this AM- will increase Semglee to 37 units in AM- won't get until tomorrow AM -8/30- CBGs better today- in low to mid 200s- down from 300s- con't regimen but will need  changes this weekend -12/29/22 CBGs 220s-280s, overall better, just started increased dose yesterday; would monitor for trend before more adjustments made 9/2- CBGs 227-395- eating a LOT of snacks- will increase Semglee to 40 units daily- I don't think we will get good control- she just eats more snacks- and nursing and I have spoken with her- she has no desire to get better control.   9/6 semglee transitioned to 30u qam and 20 u qpm--fully effective today  -numbers showing improvement  -dietary ed -01/05/23 CBGs generally better, cont regimen -01/06/23 CBGs a little worse today, but just recently changed meds; might need further adjustment but will defer to weekday team CBG (last 3)   9/9- increased Semglee to 33 units in AM since day CBGs worse than when she wakes up. Got to 373 last evening as wlel, so will likely need to increase night insulin as well  Recent Labs    01/06/23 1618 01/06/23 2027 01/07/23 0605  GLUCAP 220* 373* 198*       9. HTN: Monitor BP TID- continue to hold Lisinopril and hydrochlorothiazide.  Long-term goal normotensive --BP controlled currently  -01/05/23 BP controlled  -01/06/23 BP generally better, one isolated lower reading but better now.  9/9- Controlled with 1 soft reading-con't regimen Vitals:   01/04/23 1255 01/04/23 2116 01/05/23 0408 01/05/23 1313  BP: 108/75 (!) 124/91 116/75 (!) 124/105   01/05/23 1451 01/05/23 1928 01/06/23 0519 01/06/23 0532  BP: 115/82 120/79 125/67 (!) 96/59   01/06/23 0600 01/06/23 1408 01/06/23 1839 01/07/23 0409  BP: 110/74 100/71 115/65  117/83    10. Hyponatremia: Question chronic @131 -132 range per chart review             --recheck BMET in am   8/27- Na 135  9/9- Na 134 11. Hyperlipidemia: Trig-245/LDL-180. Now on increased dose Crestor 40mg .   Dietary education 12. Hypomagnesemia: Recheck in am. Now on Mag ox 400mg  daily. 8/27- Will check Mg in AM  8/28- mg hasn't bene done- not clear if pt refused labs or what.  8/29- Mg up to 2.0-  13. Chronic leucocytosis: Trend WBC and monitor for signs of infection.              --improved from 13.4 to 11.9  8/27- will check U/ And Cx and CXR because up to 16k 8/28- U/ A negative- has large bacteria, which could be colonization but otherwise negative- CXR negative.   8/29- feels well- will recheck in AM just to monitor and weekly  8/30- down to 12.4  9/2- 13.4k- labs pending for Monday  9/9- WBC 13.7- but has been stalbe for her 14. Morbid obesity: BMI- 39.4. CM/HH diet. RD consulted to review diet.  --Educate patient on importance of weight loss and exercise to help promote health, improve AIC and mobility.  15. Constipation: Declines need for laxative. Advised that prns on board. Monitor for now.   - Senna-S 2 tab at bedtime-  -LBM 01/05/23 9/9- LBM yesterday   I spent a total of  41  minutes on total care today- >50% coordination of care- due to  D/w SW about paperwork as well as nursing about her and CBGs- changed meds.      LOS: 14 days A FACE TO FACE EVALUATION WAS PERFORMED  Ethan Clayburn 01/07/2023, 8:33 AM

## 2023-01-07 NOTE — Discharge Summary (Signed)
Physician Discharge Summary  Patient ID: Lisa Werner MRN: [id number] DOB/AGE: [birth year] 42 y.o.  Admit date: 12/24/2022 Discharge date: 01/09/2023  Discharge Diagnoses:  Principal Problem:   Left pontine stroke [facility]) Active Problems:   Mixed hyperlipidemia   Essential hypertension   Acute right-sided weakness   LADA (latent autoimmune diabetes in adults), managed as type 1 (HCC) Persistent leucocytosis  Hyponatremia   Discharged Condition: stable  Significant Diagnostic Studies: DG CHEST PORT 1 VIEW  Result Date: 12/25/2022 CLINICAL DATA:  Leukocytosis, hypertension EXAM: PORTABLE CHEST 1 VIEW COMPARISON:  02/25/2012 FINDINGS: Single frontal view of the chest demonstrates an unremarkable cardiac silhouette. No acute airspace disease, effusion, or pneumothorax. No acute bony abnormalities. IMPRESSION: 1. No acute intrathoracic process. Electronically Signed   By: Sharlet Salina M.D.   On: 12/25/2022 14:45     Labs:  Basic Metabolic Panel:    Latest Ref Rng & Units 01/07/2023    6:59 AM 12/31/2022    6:29 AM 12/25/2022    7:54 AM  BMP  Glucose 70 - 99 mg/dL 962  952  841   BUN 6 - 20 mg/dL 14  19  12    Creatinine 0.44 - 1.00 mg/dL 3.24  4.01  0.27   Sodium 135 - 145 mmol/L 134  132  135   Potassium 3.5 - 5.1 mmol/L 4.2  4.2  4.6   Chloride 98 - 111 mmol/L 102  99  103   CO2 22 - 32 mmol/L 24  24  22    Calcium 8.9 - 10.3 mg/dL 8.9  8.7  9.1      CBC:    Latest Ref Rng & Units 01/07/2023    6:59 AM 12/31/2022    6:29 AM 12/28/2022    7:26 AM  CBC  WBC 4.0 - 10.5 K/uL 13.7  13.4  12.4   Hemoglobin 12.0 - 15.0 g/dL 25.3  66.4  40.3   Hematocrit 36.0 - 46.0 % 36.3  39.8  39.2   Platelets 150 - 400 K/uL 355  342  294      CBG: Recent Labs  Lab 01/07/23 2117 01/08/23 0619 01/08/23 0816 01/08/23 1131 01/08/23 1640  GLUCAP 175* 220* 187* 234* 194*    Brief HPI:   Lisa Werner is a 42 y.o. female with history of LAD/T IDDM, HTN, morbid obesity was  admitted on 12/19/2022 with 4-day history of right-sided weakness with difficulty walking.  MRI brain done revealing acute infarct in left pons and MRA was negative for occlusion.  2D echo showed mild to moderate LVH with EF 65 to 75% and negative bubble study.  Neurology felt stroke most likely due to small vessel disease and recommended DAPT x 3 weeks followed by aspirin alone.  Therapy was working with patient who was noted to have impairments in mobility as well as cognitive deficits.  CIR was recommended due to functional decline.   Hospital Course: Lisa Werner was admitted to rehab 12/24/2022 for inpatient therapies to consist of PT, ST and OT at least three hours five days a week. Past admission physiatrist, therapy team and rehab RN have worked together to provide customized collaborative inpatient rehab.  Her Blood pressures were monitored on TID basis and have been stable off  medications. Her diabetes has been monitored with ac/hs CBG checks and SSI was use prn for tighter BS control.  Insulin has been adjusted multiple times during the stay.  Blood sugars continue to be very and she has been  educated on importance of carb modified diet and avoidance of multiple snacks throughout the day.  She has completed 3-week course of DAPT and continues on aspirin alone.  Follow-up CBC shows H&H to be relatively stable.  She continues to have persistent leukocytosis without any signs of infection. Senna S was added to help manage constipation. She was started on Mg ox due to low magnesium level and follow up labs showed resolution with Mg @ 2.0.  She was noted to have depressed mood and team has provided ego support.  She has declined addition of antidepressant to help with mood stabilization. She has made good gains during her stay and supervision is recommended for safety. She will continue to receive outpatient PT, OT and ST at [facility] after discharge.   Rehab course: During patient's  stay in rehab weekly team conferences were held to monitor patient's progress, set goals and discuss barriers to discharge. At admission, patient required mod assist with basic ADL tasks and min assist with mobility. She exhibited mild oropharyngeal dysphagia with mild cognitive and expressive deficits.  She  has had improvement in activity tolerance, balance, postural control as well as ability to compensate for deficits. She has had improvement in functional use RUE  and RLE as well as improvement in awareness. She requires supervision for ADL tasks. She is independent for transfers and requires supervision to ambulate 70' with use of RW and cues for safety. She is able to complete cognitive tasks at modified independent to supervision level. She is able to use safe swallow strategies independently. Family education has been completed.   Discharge disposition: 01-Home or Self Care  Diet: Heart Healthy/carb Modified.   Special Instructions: No driving or strenuous activity Monitor BS ac/hs and follow up with PCP for further adjustment of medications.    Discharge Instructions     Ambulatory referral to Neurology   Complete by: As directed    An appointment is requested in approximately: 4-6 weeks   Ambulatory referral to Occupational Therapy   Complete by: As directed    Eval and treat   Ambulatory referral to Physical Medicine Rehab   Complete by: As directed    Hospital follow up   Ambulatory referral to Physical Therapy   Complete by: As directed    Eval and treat   Ambulatory referral to Speech Therapy   Complete by: As directed    Eval and treat      Allergies as of 01/09/2023       Reactions   Pollen Extract         Medication List     STOP taking these medications    clopidogrel 75 MG tablet Commonly known as: PLAVIX   INSULIN SYRINGE 1CC/30GX1/2" 30G X 1/2" 1 ML Misc   lisinopril 10 MG tablet Commonly known as: ZESTRIL       TAKE these medications     acetaminophen 325 MG tablet Commonly known as: TYLENOL Take 1-2 tablets (325-650 mg total) by mouth every 4 (four) hours as needed for mild pain. What changed:  how much to take when to take this reasons to take this   AgaMatrix Presto Test test strip Generic drug: glucose blood Check blood glucose 3 times daily before meals   AgaMatrix Presto w/Device Kit Check blood glucose 3 times daily before meals   AgaMatrix Ultra-Thin Lancets Misc Check blood glucose before meals 3 times daily.   albuterol 108 (90 Base) MCG/ACT inhaler Commonly known as: VENTOLIN HFA  Inhale 2 puffs into the lungs every 6 (six) hours as needed for wheezing.   aspirin EC 81 MG tablet Take 1 tablet (81 mg total) by mouth daily. Swallow whole.   BD Pen Needle Nano U/F 32G X 4 MM Misc Generic drug: Insulin Pen Needle Use 4 (four) times daily.   magnesium oxide 400 (240 Mg) MG tablet Commonly known as: MAG-OX Take 1 tablet (400 mg total) by mouth daily.   melatonin 3 MG Tabs tablet Take 1 tablet (3 mg total) by mouth at bedtime as needed (insomnia).   NovoLOG FlexPen 100 UNIT/ML FlexPen Generic drug: insulin aspart Inject 18 Units into the skin 3 (three) times daily with meals. What changed:  how much to take how to take this when to take this additional instructions Notes to patient: Use if blood sugar is over 80 and you plan on eating at least 50% of your meal   rosuvastatin 40 MG tablet Commonly known as: CRESTOR Take 1 tablet (40 mg total) by mouth at bedtime.   Senexon-S 8.6-50 MG tablet Generic drug: senna-docusate Take 2 tablets by mouth at bedtime.   Evaristo Bury FlexTouch 100 UNIT/ML FlexTouch Pen Generic drug: insulin degludec Inject 33 Units into the skin daily AND 23 Units at bedtime. What changed: See the new instructions.        Follow-up Information     Julieanne Manson, MD Follow up.   Specialty: Internal Medicine Why: Call in 1-2 days for post hospital follow  up Contact information: [address] [place] [address] [phone]         Genice Rouge, MD Follow up.   Specialty: Physical Medicine and Rehabilitation Why: office will call you with follow up appointment Contact information: [address] [place] [address] [phone]         [facility] Follow up.   Why: office will call you with follow up appointment Contact information: [address]     [address] [place] [address] [phone]                Signed: Jacquelynn Cree 01/14/2023, 8:47 PM

## 2023-01-07 NOTE — Progress Notes (Addendum)
Patient ID: Lisa Werner, female   DOB: 09/15/1980, 42 y.o.   MRN: [id number]  SW faxed FMLA forms to [facility] Benefits Dept at [phone].  SW met with pt and pt father during family edu. Pt has decided to discharge to her father's home. SW will order RW and TTB. Pt father will check to determine if they have any of these items in storage. SW informed if able to obtain items. SW will send outpatient PT/OT/SLP referral to [facility] (p:[phone]/f:[phone]).  SW provided FMLA forms.   SW faxed outpatient referral.   Cecile Sheerer, MSW, LCSWA Office: [phone] Cell: [phone] Fax: [phone]

## 2023-01-07 NOTE — Progress Notes (Signed)
Speech Language Pathology Daily Session Note  Patient Details  Name: Lisa Werner MRN: [id number] Date of Birth: [birth year]  Today's Date: 01/07/2023 SLP Individual Time: 0902-0930 SLP Individual Time Calculation (min): 28 min  Short Term Goals: Week 2: SLP Short Term Goal 1 (Week 2): STGs= LTGs due to ELOS  Skilled Therapeutic Interventions:  Pt was seen in am for family education session. Pt was alert and seen at bedside with her father present and participating throughout. SLP reviewed rationale for speech therapy including; dysphagia management, speech intelligibility, and cognitive re- training. SLP reviewed current diet recommendations including positioning, rate, and amount modifications as well as diet texture recommendations for regular solids and thin liquids. Additional education provided on speech intelligibility strategies of over articulation and amplifying voice. Finally, SLP discussed cognitive ret- training along with recommendations for pt to use external visual aids, provide her self additional time for higher level medication and finance tasks and double checking her work. Pt able to engage in conversation and provide additional insight appropriately. During session, pt requesting to use the bathroom. She continued to present with some impulsivity requiring min A to identify safety precautions with some return demo. Pt returned to bed where bed alarm was activated. Pt's father reported no concerns. Education was supplemented with handout. Pt left seated at EOB with bed alarm active and call button within reach. SLP to continue POC.   Pain Pain Assessment Pain Scale: 0-10 Pain Score: 0-No pain  Therapy/Group: Individual Therapy  Renaee Munda 01/07/2023, 9:28 AM

## 2023-01-07 NOTE — Progress Notes (Signed)
Physical Therapy Session Note  Patient Details  Name: Lisa Werner MRN: [id number] Date of Birth: 1980-08-01  Today's Date: 01/07/2023 PT Individual Time: 1100-1155 PT Individual Time Calculation (min): 55 min   Short Term Goals: Week 2:  PT Short Term Goal 1 (Week 2): =LTGs d/t ELOS  Skilled Therapeutic Interventions/Progress Updates:    Pt seated EOB with his father present, handed off from OT.  Session focused on family education at supervision level. Pt performed car transfer with set up assist for RW. Pt then navigated stairs 2 x 12 with therapist and her father providing close supervision. Pt ambulates throughout session with supervision to mod I level with RW. Discussed safety, using RW, energy conservation, and when to take rest breaks. Pt then performed floor transfer x 2 with therapist and father. Discussed activating EMS in case of injury. Pt then ambulated outside, taking self directed rest breaks for safety and energy conservation. Discussed signs of fatigue that pt's father can watch for. Discussed home set up, plan for places pt might  go to, instructions to maintain use of RW, etc. Pt returned to room after session and was left with all needs in reach and alarm active.   Therapy Documentation Precautions:  Precautions Precautions: Fall Precaution Comments: R hemi, inattention Restrictions Weight Bearing Restrictions: No General:       Therapy/Group: Individual Therapy  Juluis Rainier 01/07/2023, 12:16 PM

## 2023-01-07 NOTE — Progress Notes (Signed)
Occupational Therapy Session Note  Patient Details  Name: Lisa Werner MRN: [id number] Date of Birth: 10-04-1980  Today's Date: 01/07/2023 OT Individual Time: 1001-1100 OT Individual Time Calculation (min): 59 min    Short Term Goals: Week 2:  OT Short Term Goal 1 (Week 2): STG=LTG 2/2 ELOS  Skilled Therapeutic Interventions/Progress Updates:   Pt seen for skilled OT session with focus on Family Education. Father present for session. OT issued LH sponge and sock aide and is now only min A for bra fasteners and shoe laces, otherwise S, close S with RW for TTB and toilet access, issued dad info for TTB purchase and does not require toileting DME, has theraputty, beads, foam cube and built up foam handle and instructed in use and HEP. Pt amb with RW with close S with father demo competence with gait belt and RW use to access tub room with close S, Educated on curtain use for water management and safe DME set up. Access to toilet without commode or grab bar with close S. Left pt bedside with hand off to PT for session.   Pain: no pain reported   Therapy Documentation Precautions:  Precautions Precautions: Fall Precaution Comments: R hemi, inattention Restrictions Weight Bearing Restrictions: No   Therapy/Group: Individual Therapy  Vicenta Dunning 01/07/2023, 7:40 AM

## 2023-01-07 NOTE — Consult Note (Signed)
Neuropsychological Consultation Comprehensive Inpatient Rehab   Patient:   Lisa Werner   DOB:   1980-09-18  MR Number:  [id number]  Location:  [facility] [address]ENTER [address] [place] [address] Dept: [phone] Loc: [phone]           Date of Service:   01/07/2023  Start Time:   8 AM End Time:   9 AM  Provider/Observer:  Arley Phenix, Psy.D.       Clinical Neuropsychologist       Billing Code/Service: [id number]  Reason for Service:    Lisa Werner is a 42 year old female referred for neuropsychological consultation due to coping and adjustment issues with recent subcortical stroke (left pons) with residual slurred speech and right sided motor deficits.  Patient with PMH of LADA/TIDM, HTN, obesity.  Patient was admitted on 12/10/2022 with 4 day history of right sided weakness and changes in ambulation.  Patient with planned discharge in 2 days.  During visit today, patient was awake but initially sleepy but oriented quickly.  She was oriented and alert.  Aware of recent stroke but not able to give much details about what she has been told medically beyond having a stroke.  Reviewed history with patient and worked on coping and adjustment issues and planning for discharge and plans for continued work on recovery.  Patient denied significant depression and reports plan to be compliant with medications and rehab efforts post discharge.  HPI for the current admission:    HPI: Lisa Werner is a 42 year old RH- female with history of LADA/TIDM, HTN, morbid obesity who was admitted on 12/19/22 with four day history of right sided weakness with difficulty walking. She was sent over from MD office and BS 536 per EMS (had missed am dose insulin). CTA head/neck negative for LVO with area of caliber change L-V2 segment concerning for dissection. Marland Kitchen MRI brain done revealing acute infarct in left pons  and MRA negative for occulusion and 1-2 mm outpouching from right A2 question tiny aneurysm v/s infundibulum. 2D echo showed EF 65-70% with moderate LVH, trivial TVR and negative bubble study. Patient reports she had slurred speech initially but this has improved. Neurology feels like her stroke is most likely due to small vessel disease. Dr. Cherie Dark recommended DAPT for stroke due to small vessel disease/uncontrolled risk factors for 3 weeks and then aspirin alone.   Medical History:   Past Medical History:  Diagnosis Date   Diabetes mellitus without complication (HCC)    Hyperlipidemia    Hypertension          Patient Active Problem List   Diagnosis Date Noted   LADA (latent autoimmune diabetes in adults), managed as type 1 (HCC) 12/24/2022   Left pontine stroke (HCC) 12/24/2022   CVA (cerebral vascular accident) (HCC) 12/20/2022   Acute ischemic stroke (HCC) 12/19/2022   Leukocytosis 12/19/2022   Acute right-sided weakness 12/19/2022   Essential hypertension 02/21/2019   Noncompliance with diabetes treatment 07/17/2017   Mixed hyperlipidemia 06/03/2017   Pain of left eye 06/03/2017   Urinary frequency 06/03/2017   DM2 (diabetes mellitus, type 2) (HCC) 09/28/2013    Behavioral Observation/Mental Status:   Lisa Werner  presents as a 42 y.o.-year-old Right handed African American Female who appeared her stated age. her dress was Appropriate and she was Well Groomed and her manners were Appropriate to the situation.  her participation was indicative of Appropriate and  Attentive behaviors.  There were physical disabilities noted.  she displayed an appropriate level of cooperation and motivation.    Interactions:    Active Appropriate  Attention:   within normal limits and attention span and concentration were age appropriate  Memory:   within normal limits; recent and remote memory intact  Visuo-spatial:   not examined  Speech (Volume):  low  Speech:   normal;  slurred  Thought Process:  Coherent and Relevant  Coherent and Oriented  Though Content:  WNL; not suicidal and not homicidal  Orientation:   person, place, time/date, and situation  Judgment:   Fair  Planning:   Fair  Affect:    Appropriate  Mood:    Dysphoric  Insight:   Good  Intelligence:   normal  Psychiatric History:  No prior psychiatric history and no psychotropic meds beyond trazadone for sleep.  Family Med/Psych History:  Family History  Problem Relation Age of Onset   Arthritis Mother    Kidney disease Mother    Asthma Father    Diabetes Maternal Grandmother    Breast cancer Maternal Grandmother    Breast cancer Paternal Grandmother    Impression/DX:   Lisa Werner is a 42 year old female referred for neuropsychological consultation due to coping and adjustment issues with recent subcortical stroke (left pons) with residual slurred speech and right sided motor deficits.  Patient with PMH of LADA/TIDM, HTN, obesity.  Patient was admitted on 12/10/2022 with 4 day history of right sided weakness and changes in ambulation.  Patient with planned discharge in 2 days.  During visit today, patient was awake but initially sleepy but oriented quickly.  She was oriented and alert.  Aware of recent stroke but not able to give much details about what she has been told medically beyond having a stroke.  Reviewed history with patient and worked on coping and adjustment issues and planning for discharge and plans for continued work on recovery.  Patient denied significant depression and reports plan to be compliant with medications and rehab efforts post discharge.         Electronically Signed   _______________________ Arley Phenix, Psy.D. Clinical Neuropsychologist

## 2023-01-07 NOTE — Progress Notes (Signed)
Occupational Therapy Session Note  Patient Details  Name: Lisa Werner MRN: [id number] Date of Birth: 04-02-1981  Today's Date: 01/07/2023 OT Individual Time: 1330-1425 OT Individual Time Calculation (min): 55 min    Short Term Goals: Week 2:  OT Short Term Goal 1 (Week 2): STG=LTG 2/2 ELOS  Skilled Therapeutic Interventions/Progress Updates:    Pt resting in bed upon arrival. OT intervention with focus on functional amb in community envirionment, discharge planning, safety awareness, toileting, and RUE function. Pt amb with RW to Atrium entrance before resting. Pt amb with RW around courtyard on various surfaces. Pt amb down hill before resting. Pt returned up hill and rested once reentering. Ongoing discussion about RUE use and practice. Ongoing discussion about importance of daily schedule/routine. Ongoing discussion regarding outings into community and importance of preplanning. Pt returned to room and requested to use toilet. Toileting with supervsion. All ambulation with CGA/supervision. Pt remained seated EOB with all needs within reach. Bed alarm activated.   Therapy Documentation Precautions:  Precautions Precautions: Fall Precaution Comments: R hemi, inattention Restrictions Weight Bearing Restrictions: No Pain:  Pt denies pain this afternoon   Therapy/Group: Individual Therapy  Rich Brave 01/07/2023, 2:37 PM

## 2023-01-08 LAB — GLUCOSE, CAPILLARY
Glucose-Capillary: 220 mg/dL — ABNORMAL HIGH (ref 70–99)
Glucose-Capillary: 187 mg/dL — ABNORMAL HIGH (ref 70–99)
Glucose-Capillary: 234 mg/dL — ABNORMAL HIGH (ref 70–99)
Glucose-Capillary: 194 mg/dL — ABNORMAL HIGH (ref 70–99)
Glucose-Capillary: 171 mg/dL — ABNORMAL HIGH (ref 70–99)

## 2023-01-08 MED ORDER — BD PEN NEEDLE NANO U/F 32G X 4 MM MISC
1.0000 | Freq: Four times a day (QID) | 0 refills | Status: AC
  Filled 2023-01-08: qty 100, 25d supply, fill #0

## 2023-01-08 MED ORDER — ROSUVASTATIN CALCIUM 40 MG PO TABS
40.0000 mg | ORAL_TABLET | Freq: Every evening | ORAL | 0 refills | Status: AC
  Filled 2023-01-08: qty 30, 30d supply, fill #0

## 2023-01-08 MED ORDER — INSULIN GLARGINE-YFGN 100 UNIT/ML ~~LOC~~ SOLN
23.0000 [IU] | Freq: Every day | SUBCUTANEOUS | Status: DC
  Administered 2023-01-08: 23 [IU] via SUBCUTANEOUS
  Filled 2023-01-08 (×2): qty 0.23

## 2023-01-08 MED ORDER — SENNOSIDES-DOCUSATE SODIUM 8.6-50 MG PO TABS
2.0000 | ORAL_TABLET | Freq: Every day | ORAL | 0 refills | Status: DC
  Filled 2023-01-08: qty 60, 30d supply, fill #0

## 2023-01-08 MED ORDER — ACETAMINOPHEN 325 MG PO TABS: 325.0000 mg | ORAL_TABLET | ORAL | Status: AC | PRN

## 2023-01-08 MED ORDER — NOVOLOG FLEXPEN 100 UNIT/ML ~~LOC~~ SOPN
18.0000 [IU] | PEN_INJECTOR | Freq: Three times a day (TID) | SUBCUTANEOUS | 0 refills | Status: AC
  Filled 2023-01-08: qty 15, 28d supply, fill #0

## 2023-01-08 MED ORDER — MAGNESIUM OXIDE -MG SUPPLEMENT 400 (240 MG) MG PO TABS
400.0000 mg | ORAL_TABLET | Freq: Every day | ORAL | 0 refills | Status: DC
  Filled 2023-01-08: qty 120, 120d supply, fill #0

## 2023-01-08 MED ORDER — ASPIRIN 81 MG PO TBEC
81.0000 mg | DELAYED_RELEASE_TABLET | Freq: Every day | ORAL | 0 refills | Status: AC
  Filled 2023-01-08: qty 120, 120d supply, fill #0

## 2023-01-08 MED ORDER — MELATONIN 3 MG PO TABS
3.0000 mg | ORAL_TABLET | Freq: Every evening | ORAL | 0 refills | Status: AC | PRN
  Filled 2023-01-08: qty 60, 60d supply, fill #0

## 2023-01-08 MED ORDER — TRESIBA FLEXTOUCH 100 UNIT/ML ~~LOC~~ SOPN
PEN_INJECTOR | SUBCUTANEOUS | 1 refills | Status: AC
  Filled 2023-01-08: qty 15, 27d supply, fill #0

## 2023-01-08 MED ORDER — NOVOLOG FLEXPEN 100 UNIT/ML ~~LOC~~ SOPN
18.0000 [IU] | PEN_INJECTOR | Freq: Three times a day (TID) | SUBCUTANEOUS | 11 refills | Status: DC
  Filled 2023-01-08: qty 6, fill #0

## 2023-01-08 NOTE — Progress Notes (Signed)
Physical Therapy Discharge Summary  Patient Details  Name: Lisa Werner MRN: [id number] Date of Birth: 02-23-1981  Date of Discharge from PT service:January 08, 2023  Today's Date: 01/08/2023 PT Individual Time: 0830-0945 PT Individual Time Calculation (min): 75 min    Patient has met 5 of 7 long term goals due to improved balance, increased strength, ability to compensate for deficits, and improved coordination.  Patient to discharge at an ambulatory level Supervision.   Patient's care partner is independent to provide the necessary physical assistance at discharge.  Reasons goals not met: Pt did not meet mod I gait and set up car transfer goal because she still requires supervision for safety at this time.   Recommendation:  Patient will benefit from ongoing skilled PT services in outpatient setting to continue to advance safe functional mobility, address ongoing impairments in gait mechanics, endurance, strength, and minimize fall risk.  Equipment: RW  Reasons for discharge: treatment goals met and discharge from hospital  Patient/family agrees with progress made and goals achieved: Yes  Skilled Therapeutic Interventions/Progress Updates:  pt received in bed and agreeable to therapy. No complaint of pain, but reports she has had some R shoulder pain after a previous therapy session. No signs of subluxation, communicated with OT regarding exercises for pain management.  Session focused on d/c assessments as documented below, including , berg, motor and sensory testing. Also provided pt with a short HEP to emphasize walking program, balance, and strength training until starting OPPT. Pt returned to room at end of session and was left with all needs in reach and alarm active.   6 Min Walk Test:  Instructed patient to ambulate as quickly and as safely as possible for 6 minutes using LRAD. Patient was allowed to take standing rest breaks without stopping the test, but  if the patient required a sitting rest break the clock would be stopped and the test would be over.  Results: 575 feet (191.5 meters, Avg speed .47m/s) using a RW with supervision. Results indicate that the patient has reduced endurance with ambulation compared to age matched norms.  Age Matched Norms: 78-69 yo M: 38 F: 74, 74-79 yo M: 42 F: 471, 9-89 yo M: 417 F: 392 MDC: 58.21 meters (190.98 feet) or 50 meters (ANPTA Core Set of Outcome Measures for Adults with Neurologic Conditions, 2018)   PT Discharge Precautions/Restrictions Precautions Precautions: Fall Precaution Comments: R hemi, inattention Restrictions Weight Bearing Restrictions: No  Pain Interference Pain Interference Pain Effect on Sleep: 1. Rarely or not at all Pain Interference with Therapy Activities: 1. Rarely or not at all Pain Interference with Day-to-Day Activities: 1. Rarely or not at all Vision/Perception  Vision - History Ability to See in Adequate Light: 0 Adequate Perception Perception: Within Functional Limits Praxis Praxis: WFL  Cognition Overall Cognitive Status: Impaired/Different from baseline Arousal/Alertness: Awake/alert Orientation Level: Oriented X4 Month: September Memory: Appears intact Awareness: Appears intact Safety/Judgment: Other (comment) (intermittent but improving) Sensation Sensation Light Touch: Appears Intact Proprioception: Appears Intact Coordination Gross Motor Movements are Fluid and Coordinated: No Fine Motor Movements are Fluid and Coordinated: No Coordination and Movement Description: RUE impaired, weaker than LUE and delayed reactions Motor  Motor Motor: Hemiplegia Motor - Skilled Clinical Observations: R hemi, dominant side  Mobility Bed Mobility Bed Mobility: Rolling Right;Rolling Left;Supine to Sit;Sit to Supine Rolling Right: Independent Rolling Left: Independent Supine to Sit: Independent Sit to Supine: Independent Transfers Transfers: Sit to  Stand;Stand to Sit;Stand Pivot Transfers Sit to Stand: Independent with  assistive device Stand to Sit: Independent with assistive device Stand Pivot Transfers: Independent with assistive device Transfer (Assistive device): Rolling walker Locomotion  Gait Ambulation: Yes Gait Assistance: Supervision/Verbal cueing Gait Distance (Feet): 688 Feet Assistive device: Rolling walker Gait Gait: Yes Gait Pattern: Step-through pattern;Right genu recurvatum;Right circumduction;Right hip hike;Decreased weight shift to right Stairs / Additional Locomotion Stairs: Yes Stairs Assistance: Supervision/Verbal cueing Stair Management Technique: Two rails Number of Stairs: 16 Height of Stairs: 6 Ramp: Supervision/Verbal cueing Curb: Supervision/Verbal cueing Pick up small object from the floor assist level: Supervision/Verbal cueing Wheelchair Mobility Wheelchair Mobility: No  Trunk/Postural Assessment  Cervical Assessment Cervical Assessment: Within Functional Limits Thoracic Assessment Thoracic Assessment: Within Functional Limits Lumbar Assessment Lumbar Assessment: Within Functional Limits Postural Control Postural Control: Deficits on evaluation Righting Reactions: delayed  Balance Balance Balance Assessed: Yes Standardized Balance Assessment Standardized Balance Assessment: Berg Balance Test Berg Balance Test Sit to Stand: Able to stand without using hands and stabilize independently Standing Unsupported: Able to stand safely 2 minutes Sitting with Back Unsupported but Feet Supported on Floor or Stool: Able to sit safely and securely 2 minutes Stand to Sit: Sits safely with minimal use of hands Transfers: Able to transfer safely, definite need of hands Standing Unsupported with Eyes Closed: Able to stand 10 seconds with supervision Standing Ubsupported with Feet Together: Able to place feet together independently and stand for 1 minute with supervision From Standing, Reach Forward  with Outstretched Arm: Can reach confidently >25 cm (10") From Standing Position, Pick up Object from Floor: Able to pick up shoe, needs supervision From Standing Position, Turn to Look Behind Over each Shoulder: Looks behind from both sides and weight shifts well Turn 360 Degrees: Able to turn 360 degrees safely but slowly Standing Unsupported, Alternately Place Feet on Step/Stool: Able to complete >2 steps/needs minimal assist Standing Unsupported, One Foot in Front: Able to plae foot ahead of the other independently and hold 30 seconds Standing on One Leg: Able to lift leg independently and hold 5-10 seconds Total Score: 45 Static Sitting Balance Static Sitting - Balance Support: Bilateral upper extremity supported;Feet supported Static Sitting - Level of Assistance: 6: Modified independent (Device/Increase time) Dynamic Sitting Balance Dynamic Sitting - Balance Support: During functional activity Dynamic Sitting - Level of Assistance: 6: Modified independent (Device/Increase time) Static Standing Balance Static Standing - Balance Support: Left upper extremity supported;Right upper extremity supported Static Standing - Level of Assistance: 6: Modified independent (Device/Increase time) Dynamic Standing Balance Dynamic Standing - Balance Support: During functional activity Dynamic Standing - Level of Assistance: 5: Stand by assistance Extremity Assessment      RLE Assessment RLE Assessment: Exceptions to [facility] General Strength Comments: grossly 4-/5 LLE Assessment LLE Assessment: Within Functional Limits General Strength Comments: Grossly 5/5   Juwann Sherk C Guilianna Mckoy 01/08/2023, 12:21 PM

## 2023-01-08 NOTE — Progress Notes (Signed)
PROGRESS NOTE   Subjective/Complaints:  Pt reports no issues Asking again about paperwork- found out from SW given to PA- Rinaldo Cloud- will d/w her.    ROS:   Pt denies SOB, abd pain, CP, N/V/C/D, and vision changes    Objective:   No results found. Recent Labs    01/07/23 0659  WBC 13.7*  HGB 11.4*  HCT 36.3  PLT 355    Recent Labs    01/07/23 0659  NA 134*  K 4.2  CL 102  CO2 24  GLUCOSE 210*  BUN 14  CREATININE 0.98  CALCIUM 8.9      Intake/Output Summary (Last 24 hours) at 01/08/2023 0814 Last data filed at 01/07/2023 1807 Gross per 24 hour  Intake 360 ml  Output --  Net 360 ml        Physical Exam: Vital Signs Blood pressure 121/72, pulse 91, temperature 98.1 F (36.7 C), temperature source Oral, resp. rate 18, height 5\' 2"  (1.575 m), weight 95.2 kg, SpO2 97%.     General: supine in bed; asleep- woke briefly to verbal and light stimuli;  NAD HENT: conjugate gaze; oropharynx moist CV: regular rate and rhythm; no JVD Pulmonary: CTA B/L; no W/R/R- good air movement GI: soft, NT, ND, (+)BS Psychiatric: flat Neurological: more delayed this AM, due to sedation- doesn't want to wake up Ext: no clubbing, cyanosis, or edema Psych: pleasant and cooperative, still somewhat flat Skin: No evidence of breakdown, no evidence of rash  PRIOR EXAMS: Neuro: Alert and oriented x 3, she is able to provide a clear and coherent history, follows commands, cranial nerves II through XII intact besides shoulder shrug is a little decreased on the right, able to name and repeat, no dysarthria or aphasia noted RUE: 4-/5, RLE 4- to 4/5. LUE and LLE 5/5.  Sensory exam normal for light touch and pain in all 4 limbs.  No abnormal tone appreciated.       Assessment/Plan: 1. Functional deficits which require 3+ hours per day of interdisciplinary therapy in a comprehensive inpatient rehab setting. Physiatrist is  providing close team supervision and 24 hour management of active medical problems listed below. Physiatrist and rehab team continue to assess barriers to discharge/monitor patient progress toward functional and medical goals  Care Tool:  Bathing    Body parts bathed by patient: Right arm, Left arm, Chest, Abdomen, Front perineal area, Buttocks, Right upper leg, Left upper leg, Right lower leg, Left lower leg, Face   Body parts bathed by helper: Buttocks, Left lower leg, Right lower leg     Bathing assist Assist Level: Supervision/Verbal cueing     Upper Body Dressing/Undressing Upper body dressing   What is the patient wearing?: Bra, Pull over shirt    Upper body assist Assist Level: Minimal Assistance - Patient > 75%    Lower Body Dressing/Undressing Lower body dressing      What is the patient wearing?: Underwear/pull up, Pants     Lower body assist Assist for lower body dressing: Supervision/Verbal cueing     Toileting Toileting    Toileting assist Assist for toileting: Set up assist     Transfers Chair/bed transfer  Transfers  assist     Chair/bed transfer assist level: Contact Guard/Touching assist     Locomotion Ambulation   Ambulation assist      Assist level: Contact Guard/Touching assist Assistive device: Walker-rolling Max distance: 350   Walk 10 feet activity   Assist     Assist level: Contact Guard/Touching assist Assistive device: Walker-rolling   Walk 50 feet activity   Assist    Assist level: Contact Guard/Touching assist Assistive device: Walker-rolling    Walk 150 feet activity   Assist Walk 150 feet activity did not occur: Safety/medical concerns  Assist level: Contact Guard/Touching assist Assistive device: Walker-rolling    Walk 10 feet on uneven surface  activity   Assist Walk 10 feet on uneven surfaces activity did not occur: Safety/medical concerns   Assist level: Contact Guard/Touching  assist Assistive device: Walker-rolling   Wheelchair     Assist Is the patient using a wheelchair?: Yes Type of Wheelchair: Manual    Wheelchair assist level: Dependent - Patient 0%      Wheelchair 50 feet with 2 turns activity    Assist        Assist Level: Dependent - Patient 0%   Wheelchair 150 feet activity     Assist      Assist Level: Dependent - Patient 0%   Blood pressure 121/72, pulse 91, temperature 98.1 F (36.7 C), temperature source Oral, resp. rate 18, height 5\' 2"  (1.575 m), weight 95.2 kg, SpO2 97%.   Medical Problem List and Plan: 1. 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  2.  Antithrombotics: -DVT/anticoagulation:  Pharmaceutical: Lovenox 40mg  daily             -antiplatelet therapy: DAPT X 3 weeks followed by ASA alone 3. Pain Management:  N/A 4. Mood/Behavior/Sleep: LCSW to follow for evaluation and support             -antipsychotic agents: N/A  -Melatonin PRN -9/6 pt does not want to be on antidepressant--mood seems a little better this week   -requested neuropsych input   -team continues to provide ego support  9/9- seeing neuropsych today 5. Neuropsych/cognition: This patient is capable of making decisions on her own behalf. 6. Skin/Wound Care: Routine pressure relief measures.  7. Fluids/Electrolytes/Nutrition: Monitor I/O. Check CMET in am.  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 On Tresiba 16 units w/ Novolog 20 units TID ac/ Endo @ [facility]             --Continue Insulin gargline 30 units with 15 units TID ac --Monitor BS ac/hs and continue to educate on compliance, risk factors/benefits. She didn't take insulin consistently and does not wear CGM due to "excessive beeping" -8/27- CBGs  195-221- will increase Semglee to 33 units daily- first dose in AM -8/28- CBGs 182 this AM- but hasn't started new dosing of Semglee- will monitor for trend.  -8/29- CBGs 160-278- worse this AM- will increase Semglee to 37 units in AM- won't get until tomorrow AM -8/30- CBGs better today- in low to mid 200s- down from 300s- con't regimen but will need changes this weekend -12/29/22 CBGs 220s-280s, overall better, just started increased  dose yesterday; would monitor for trend before more adjustments made 9/2- CBGs 227-395- eating a LOT of snacks- will increase Semglee to 40 units daily- I don't think we will get good control- she just eats more snacks- and nursing and I have spoken with her- she has no desire to get better control.   9/6 semglee transitioned to 30u qam and 20 u qpm--fully effective today  -numbers showing improvement  -dietary ed -01/05/23 CBGs generally better, cont regimen -01/06/23 CBGs a little worse today, but just recently changed meds; might need further adjustment but will defer to weekday team CBG (last 3)   9/9- increased Semglee to 33 units in AM since day CBGs worse than when she wakes up. Got to 373 last evening as wlel, so will likely need to increase night insulin as well  9/10- CBGs 175-220- doing better- will increase night insulin tonight. Increased to 23 units nightly  Recent Labs    01/07/23 1617 01/07/23 2117 01/08/23 0619  GLUCAP 183* 175* 220*       9. HTN: Monitor BP TID- continue to hold Lisinopril and hydrochlorothiazide.  Long-term goal normotensive --BP controlled currently  -01/05/23 BP controlled  -01/06/23 BP generally better, one isolated lower reading but better now.  9/9- Controlled with 1 soft reading-con't regimen  9/10- much better control Vitals:   01/05/23 1313 01/05/23 1451 01/05/23 1928 01/06/23 0519  BP: (!) 124/105 115/82 120/79 125/67   01/06/23 0532 01/06/23 0600 01/06/23 1408 01/06/23 1839  BP: (!) 96/59 110/74 100/71 115/65    01/07/23 0409 01/07/23 1331 01/07/23 1849 01/08/23 0515  BP: 117/83 120/78 130/65 121/72    10. Hyponatremia: Question chronic @131 -132 range per chart review             --recheck BMET in am   8/27- Na 135  9/9- Na 134 11. Hyperlipidemia: Trig-245/LDL-180. Now on increased dose Crestor 40mg .   Dietary education 12. Hypomagnesemia: Recheck in am. Now on Mag ox 400mg  daily. 8/27- Will check Mg in AM  8/28- mg hasn't bene done- not clear if pt refused labs or what.  8/29- Mg up to 2.0-  13. Chronic leucocytosis: Trend WBC and monitor for signs of infection.              --improved from 13.4 to 11.9  8/27- will check U/ And Cx and CXR because up to 16k 8/28- U/ A negative- has large bacteria, which could be colonization but otherwise negative- CXR negative.   8/29- feels well- will recheck in AM just to monitor and weekly  8/30- down to 12.4  9/2- 13.4k- labs pending for Monday  9/9- WBC 13.7- but has been stalbe for her 14. Morbid obesity: BMI- 39.4. CM/HH diet. RD consulted to review diet.  --Educate patient on importance of weight loss and exercise to help promote health, improve AIC and mobility.  15. Constipation: Declines need for laxative. Advised that prns on board. Monitor for now.   - Senna-S 2 tab at bedtime-  -LBM 01/05/23 9/9- LBM yesterday   I spent a total of  39  minutes on total care today- >50% coordination of care- due to  D/w PA about paperwork and team conference to finalize d/c.      LOS: 15 days A FACE TO FACE EVALUATION WAS PERFORMED  Rodolfo Notaro 01/08/2023, 8:14 AM

## 2023-01-08 NOTE — Plan of Care (Signed)
  Problem: RH Eating Goal: LTG Patient will perform eating w/assist, cues/equip (OT) Description: LTG: Patient will perform eating with assist, with/without cues using equipment (OT) Outcome: Completed/Met   Problem: RH Grooming Goal: LTG Patient will perform grooming w/assist,cues/equip (OT) Description: LTG: Patient will perform grooming with assist, with/without cues using equipment (OT) Outcome: Completed/Met   Problem: RH Bathing Goal: LTG Patient will bathe all body parts with assist levels (OT) Description: LTG: Patient will bathe all body parts with assist levels (OT) Outcome: Completed/Met   Problem: RH Dressing Goal: LTG Patient will perform upper body dressing (OT) Description: LTG Patient will perform upper body dressing with assist, with/without cues (OT). Outcome: Completed/Met Goal: LTG Patient will perform lower body dressing w/assist (OT) Description: LTG: Patient will perform lower body dressing with assist, with/without cues in positioning using equipment (OT) Outcome: Completed/Met   Problem: RH Toileting Goal: LTG Patient will perform toileting task (3/3 steps) with assistance level (OT) Description: LTG: Patient will perform toileting task (3/3 steps) with assistance level (OT)  Outcome: Completed/Met   Problem: RH Functional Use of Upper Extremity Goal: LTG Patient will use RT/LT upper extremity as a (OT) Description: LTG: Patient will use right/left upper extremity as a stabilizer/gross assist/diminished/nondominant/dominant level with assist, with/without cues during functional activity (OT) Outcome: Completed/Met   Problem: RH Light Housekeeping Goal: LTG Patient will perform light housekeeping w/assist (OT) Description: LTG: Patient will perform light housekeeping with assistance, with/without cues (OT). Outcome: Completed/Met   Problem: RH Toilet Transfers Goal: LTG Patient will perform toilet transfers w/assist (OT) Description: LTG: Patient will  perform toilet transfers with assist, with/without cues using equipment (OT) Outcome: Completed/Met   Problem: RH Tub/Shower Transfers Goal: LTG Patient will perform tub/shower transfers w/assist (OT) Description: LTG: Patient will perform tub/shower transfers with assist, with/without cues using equipment (OT) Outcome: Completed/Met

## 2023-01-08 NOTE — Plan of Care (Signed)
  Problem: RH Swallowing Goal: LTG Patient will consume least restrictive diet using compensatory strategies with assistance (SLP) Description: LTG:  Patient will consume least restrictive diet using compensatory strategies with assistance (SLP) Outcome: Completed/Met Flowsheets (Taken 12/25/2022 1244 by Sockwell, Cassidi F, CCC-SLP) LTG: Pt Patient will consume least restrictive diet using compensatory strategies with assistance of (SLP): Modified Independent   Problem: RH Expression Communication Goal: LTG Patient will increase speech intelligibility (SLP) Description: LTG: Patient will increase speech intelligibility at word/phrase/conversation level with cues, % of the time (SLP) Outcome: Completed/Met Flowsheets Taken 01/08/2023 1337 by Renaee Munda, CCC-SLP LTG: Patient will increase speech intelligibility (SLP): Modified Independent Taken 12/25/2022 1244 by Sockwell, Cassidi F, CCC-SLP Level: Conversation level Percent of time patient will use intelligible speech: 90   Problem: RH Problem Solving Goal: LTG Patient will demonstrate problem solving for (SLP) Description: LTG:  Patient will demonstrate problem solving for basic/complex daily situations with cues  (SLP) Outcome: Completed/Met Flowsheets (Taken 01/08/2023 1337) LTG: Patient will demonstrate problem solving for (SLP): Complex daily situations LTG Patient will demonstrate problem solving for: Modified Independent   Problem: RH Awareness Goal: LTG: Patient will demonstrate awareness during functional activites type of (SLP) Description: LTG: Patient will demonstrate awareness during functional activites type of (SLP) Outcome: Completed/Met Flowsheets Taken 01/08/2023 1337 by Renaee Munda, CCC-SLP Patient will demonstrate during cognitive/linguistic activities awareness type of: Emergent Taken 12/25/2022 1244 by Sockwell, Cassidi F, CCC-SLP LTG: Patient will demonstrate awareness during cognitive/linguistic activities  with assistance of (SLP): Modified Independent

## 2023-01-08 NOTE — Progress Notes (Addendum)
Patient ID: Lisa Werner, female   DOB: 1980/06/04, 42 y.o.   MRN: [id number]  SW went by pt room to provide updates from team conference, and confirm d/c tomorrow. SW inquired if her family was able to find DME. She asked SW if items were ordered. SW provided contact information for [employer] for DME. She intends to follow-up.   *SW received updates from [employer] still no follow-up about DME and unable to leave voicemail message.   1342-SW left message for [name] to provide above updates, and asked to assist pt with making contact with DME and [employer] if they were unable to locate.   Cecile Sheerer, MSW, LCSWA Office: [phone] Cell: [phone] Fax: [phone]

## 2023-01-08 NOTE — Patient Care Conference (Signed)
Inpatient RehabilitationTeam Conference and Plan of Care Update Date: 01/08/2023   Time: 11:07 AM   Patient Name: Lisa Werner      Medical Record Number: [id number]  Date of Birth: 1980-07-17 Sex: Female         Room/Bed: 4M01C/4M01C-01 Payor Info: Payor: [employer] / Plan: [facility] PPO / Product Type: *No Product type* /    Admit Date/Time:  12/24/2022  3:12 PM  Primary Diagnosis:  Left pontine stroke [facility])  Hospital Problems: Principal Problem:   Left pontine stroke (HCC) Active Problems:   Mixed hyperlipidemia   Essential hypertension   Acute right-sided weakness   LADA (latent autoimmune diabetes in adults), managed as type 1 [facility])    Expected Discharge Date: Expected Discharge Date: 01/09/23  Team Members Present: Physician leading conference: Dr. Genice Rouge Social Worker Present: Cecile Sheerer, LCSWA Nurse Present: Vedia Pereyra, RN PT Present: Bernie Covey, PT OT Present: Roney Mans, OT SLP Present: Feliberto Gottron, SLP PPS Coordinator present : Fae Pippin, SLP     Current Status/Progress Goal Weekly Team Focus  Bowel/Bladder   patient is continent of B/B   maintain continence   offer toileting qshift and PRN    Swallow/Nutrition/ Hydration   goal met! regular/ thin   mod I       ADL's   issued LH sponge and socl aide and is now only min A for bra fasteners and shoe laces, otherwise S, close S with RW for TTB and toilet access, issued dad info for TTB purchase and does not require toileting DME, has theraputty, beads, foam cube and built up foam handle   supervision-Mod I   Completed family educ with dad, progress fasteners and compensatory methods for fasteners    Mobility   approaching goal level, family education completed   mod I  safety awareness and gait mechanics    Communication   mod I to sup A   Mod I   carryover of speech intelligibility strategies    Safety/Cognition/ Behavioral  Observations  mod I to sup A   Mod I   awareness    Pain   no c/o pain   remain pain free   assess pain qshit and PRN    Skin   skin is intact   maintain skin integrity  assess skin q shift and PRN      Discharge Planning:  Pt discharge to home- and her father and stepmother will help assist. Pt has the option to go to her father's home as well. Father works, step-mother can provide supervision only. Family edu scheudled for Monday (9/9/) 9am-12pm with pt father. DME- RW and TTB ordered. Pt will cancel order if she has access to these items. Outpatient [facility]/OT/SLP [facility]. SW will confirm there are no barriers to discharge.   Team Discussion: Left pontine stroke. Very flat affect. Doesn't engage with staff. Refuses medication for mood. Denies being depressed. Denies pain.  Skin remain intact. Diet education is on-going with A1c 15.5. Blood sugars uptrending. WBC's trending up.  AC/HS.  Daily weights. Tolerating heart healthy carb-mod diet.   Patient on target to meet rehab goals: no, Some goals not met with a discharge date of 01/09/23  *See Care Plan and progress notes for long and short-term goals.   Revisions to Treatment Plan:  Adjusting insulin. Monitor labs/VS Teaching Needs: Medications, safety, self care, diet/lifestyle modifications, gait/transfer training, etc.   Current Barriers to Discharge: Decreased caregiver support,  Home enviroment access/layout, New diabetic, Weight, and Medication compliance  Possible Resolutions to Barriers: Family education Able to go up steps safely Adhere to appropriate diet/lifestyle modifications Compliant with antidiabetic medications Order recommended DME     Medical Summary Current Status: continent B/B- LBM yesterday- denies pain; won't wake up in AM    Barriers to Discharge Comments: family education done yesterday-depressed, but refuses meds- Possible Resolutions to [employer] Focus: approching mod I goals-  but will go home at supervision for some; mod I for ADLs' - regular thin diet and SLP  mod I to supervision- d/c tomorrow;  medically otherwise ready for d/c   Continued Need for Acute Rehabilitation Level of Care: The patient requires daily medical management by a physician with specialized training in physical medicine and rehabilitation for the following reasons: Direction of a multidisciplinary physical rehabilitation program to maximize functional independence : Yes Medical management of patient stability for increased activity during participation in an intensive rehabilitation regime.: Yes Analysis of laboratory values and/or radiology reports with any subsequent need for medication adjustment and/or medical intervention. : Yes   I attest that I was present, lead the team conference, and concur with the assessment and plan of the team.   Jearld Adjutant 01/08/2023, 6:15 PM

## 2023-01-08 NOTE — Progress Notes (Signed)
Inpatient Rehabilitation Discharge Medication Review by a Pharmacist  A complete drug regimen review was completed for this patient to identify any potential clinically significant medication issues.  High Risk Drug Classes Is patient taking? Indication by Medication  Antipsychotic No   Anticoagulant Yes   Antibiotic No   Opioid No   Antiplatelet Yes Aspirin/plavix- cva ppx DAPT x3 weeks f/b asa alone (end date: 9/11)  Hypoglycemics/insulin Yes Insulin- T2DM  Vasoactive Medication No   Chemotherapy No   Other Yes Crestor- HLD Melatonin- sleep Trazodone- sleep Magnesium- supplement      Type of Medication Issue Identified Description of Issue Recommendation(s)  Drug Interaction(s) (clinically significant)     Duplicate Therapy     Allergy     No Medication Administration End Date     Incorrect Dose     Additional Drug Therapy Needed     Significant med changes from prior encounter (inform family/care partners about these prior to discharge).    Other  PTA meds HCTZ Zestril Restart PTA meds when and if necessary during CIR admission or at time of discharge, if warranted    Clinically significant medication issues were identified that warrant physician communication and completion of prescribed/recommended actions by midnight of the next day:  No   Time spent performing this drug regimen review (minutes):  30   Jani Gravel, PharmD Clinical Pharmacist  01/08/2023 2:40 PM

## 2023-01-08 NOTE — Progress Notes (Signed)
Nutrition Brief Note:   RD attempted to provide education again prior to discharge. Pt was sleeping at time of assessment. Pt woke to knock on the door but pt was not interested in diet education. RD left handouts at bedside. Pt is eating 100% of her meals.   Bethann Humble, RD, LDN, CNSC.

## 2023-01-08 NOTE — Progress Notes (Signed)
Occupational Therapy Discharge Summary  Patient Details  Name: Lisa Werner MRN: [id number] Date of Birth: 10-02-1980  Date of Discharge from OT service:January 08, 2023  Today's Date: 01/08/2023 OT Individual Time: 1015-1130 OT Individual Time Calculation (min): 75 min    Patient has met 9 of 9 long term goals due to improved activity tolerance, improved balance, postural control, ability to compensate for deficits, functional use of  RIGHT upper and RIGHT lower extremity, improved attention, improved awareness, and improved coordination.  Patient to discharge at overall Supervision level.  Patient's care partner is independent to provide the necessary physical and cognitive assistance at discharge.    Reasons goals not met: n/a   Recommendation:  Patient will benefit from ongoing skilled OT services in outpatient setting to continue to advance functional skills in the area of BADL, iADL, Vocation, and Reduce care partner burden.  Equipment: LH sponge and sock aide issued, RW and TTB   Reasons for discharge: treatment goals met  Patient/family agrees with progress made and goals achieved: Yes  OT Discharge Precautions/Restrictions  Precautions Precautions: Fall Precaution Comments: R hemi, inattention Restrictions Weight Bearing Restrictions: No General   Vital Signs Therapy Vitals Temp: 97.9 F (36.6 C) Temp Source: Oral Pulse Rate: 88 Resp: 16 BP: 132/85 Patient Position (if appropriate): Lying Oxygen Therapy SpO2: 99 % O2 Device: Room Air Pain Pain Assessment Pain Scale: 0-10 Pain Score: 0-No pain ADL ADL Equipment Provided: Reacher, Long-handled sponge, Sock aid Eating: Modified independent Where Assessed-Eating: Edge of bed Grooming: Modified independent Where Assessed-Grooming: Standing at sink, Sitting at sink Upper Body Bathing: Modified independent Where Assessed-Upper Body Bathing: Shower Lower Body Bathing: Setup Where  Assessed-Lower Body Bathing: Shower Upper Body Dressing: Setup Where Assessed-Upper Body Dressing: Edge of bed Lower Body Dressing: Setup Where Assessed-Lower Body Dressing: Edge of bed Toileting: Setup Where Assessed-Toileting: Teacher, adult education: Close supervision Toilet Transfer Method: Proofreader: Drop arm bedside commode Tub/Shower Transfer: Close supervison Web designer Method: Ship broker: Insurance underwriter: Close supervision Film/video editor Method: Designer, industrial/product: Information systems manager with back ADL Comments: able to complete bra fasteners indep this session Vision Baseline Vision/History: 0 No visual deficits Patient Visual Report: No change from baseline Eye Alignment: Within Functional Limits Ocular Range of Motion: Within Functional Limits Alignment/Gaze Preference: Within Defined Limits Convergence: Within functional limits Visual Fields: No apparent deficits Perception  Perception: Within Functional Limits Praxis Praxis: WFL Cognition Cognition Overall Cognitive Status: Impaired/Different from baseline Arousal/Alertness: Awake/alert Orientation Level: Person;Place;Situation Memory: Appears intact Attention: Sustained Sustained Attention: Appears intact Sustained Attention Impairment: Verbal basic;Verbal complex;Functional basic;Functional complex Awareness: Appears intact Awareness Impairment: Emergent impairment Problem Solving: Impaired Problem Solving Impairment: Verbal basic;Functional basic;Verbal complex Safety/Judgment: Impaired Comments: Some lingering impulsivity Brief Interview for Mental Status (BIMS) Repetition of Three Words (First Attempt): 3 Temporal Orientation: Year: Correct Temporal Orientation: Month: Accurate within 5 days Temporal Orientation: Day: Correct Recall: "Sock": Yes, no cue required Recall: "Blue": Yes, no cue  required Recall: "Bed": Yes, no cue required BIMS Summary Score: 15 Sensation Sensation Light Touch: Appears Intact Hot/Cold: Appears Intact Stereognosis: Appears Intact Coordination Gross Motor Movements are Fluid and Coordinated: No Fine Motor Movements are Fluid and Coordinated: No Coordination and Movement Description: RUE impaired, weaker than LUE and delayed reactions Finger Nose Finger Test: impaired 9 Hole Peg Test: intact L UE, delayed R UE Motor  Motor Motor: Hemiplegia Motor - Skilled Clinical Observations: R hemi, dominant side Mobility  Bed Mobility  Bed Mobility: Rolling Right;Rolling Left;Supine to Sit;Sit to Supine Rolling Right: Independent Rolling Left: Independent Supine to Sit: Independent Sit to Supine: Independent Transfers Sit to Stand: Independent with assistive device Stand to Sit: Independent with assistive device  Trunk/Postural Assessment  Cervical Assessment Cervical Assessment: Within Functional Limits Thoracic Assessment Thoracic Assessment: Within Functional Limits Lumbar Assessment Lumbar Assessment: Within Functional Limits Postural Control Postural Control: Deficits on evaluation Righting Reactions: delayed  Balance Balance Balance Assessed: Yes Static Sitting Balance Static Sitting - Balance Support: Bilateral upper extremity supported;Feet supported Static Sitting - Level of Assistance: 6: Modified independent (Device/Increase time) Dynamic Sitting Balance Dynamic Sitting - Balance Support: During functional activity Dynamic Sitting - Level of Assistance: 6: Modified independent (Device/Increase time) Dynamic Sitting - Balance Activities: Lateral lean/weight shifting;Reaching for objects;Reaching across midline Sitting balance - Comments: reaches outside BOS without LOB Static Standing Balance Static Standing - Balance Support: Left upper extremity supported;Right upper extremity supported Static Standing - Level of Assistance: 6:  Modified independent (Device/Increase time) Dynamic Standing Balance Dynamic Standing - Balance Support: During functional activity Dynamic Standing - Level of Assistance: 5: Stand by assistance Dynamic Standing - Balance Activities: Reaching for weighted objects;Lateral lean/weight shifting;Forward lean/weight shifting;Reaching for objects Extremity/Trunk Assessment RUE Assessment RUE Assessment: Exceptions to [facility] General Strength Comments: grossly 4-/5   Pt seen for OT discharge reassessment, training and educ completion. Focus on full AM self care routine and shower training with emphasis on activity tolerance, energy conservation and dynamic balance as well as R UE NMRE.  Safety and sequencing for ADL set up with RW integration conducted as well. Pt relatively mod I with BADL's except S for shower safety due to higher risk slip and fall from wet surfaces. Pt able to successfully fasten bra this session. Pt continues to demonstrate roughly 5-6 min standing tolerance and seated rests between standing activity. Was able to retrieve soiled laundry from bathroom and transport with string bag Werner L shoulder to suitcase for packing. OT transported pt outdoors via w/c to and from to address light UE HEP standing at patio table for light balance. Once back in room, pt requested back to EOB.  Pt left at end of session seated EOB with safety needs, call button and bed alarm engaged. No pain whatsoever.  Vicenta Dunning 01/08/2023, 4:45 PM

## 2023-01-08 NOTE — Progress Notes (Signed)
Speech Language Pathology Discharge Summary  Patient Details  Name: Lisa Werner MRN: [id number] Date of Birth: 11/08/1980  Date of Discharge from SLP service:January 08, 2023  Today's Date: 01/08/2023 SLP Individual Time: 0100-0158 SLP Individual Time Calculation (min): 58 min   Skilled Therapeutic Interventions: Pt was seen in PM to address cognitive re- training. Pt was alert and seen at bedside upon SLP arrival. Pt expressed excitement regarding upcoming discharge. SLP engaged pt in completion of cognitive screen to assess progress upon discharge. SLP administered SLUMS with pt scoring a 23/30 (WNL>26). Pt presented with strengths in orientation and delayed and immediate recall. Deficits were observed in mental manipulation, executive function, and calculations. In remaining minutes of session, SLP challenged pt in problem solving through challenging pt to identify solutions to medical, daily living, and financial problems. Pt identified appropriate solutions with 100% acc with mod I. Pt left seated at EOB with call button within reach and bed alarm active. SLP to continue POC.   Patient has met 4 of 4 long term goals.  Patient to discharge at overall Modified Independent level.  Reasons goals not met:     Clinical Impression/Discharge Summary: Pt has made excellent gains and has met 4 of 4 LTG's this admission due to improved problem solving, speech intelligibility, and awareness. Pt is currently an overall mod I to supervision for cognitive tasks and is mod I for utilization of swallowing compensatory strategies with regular solids and thin liquids. Pt/ family education complete and pt will discharge home with supervision from family. No further skilled SLP intervention is warranted at this time.  Care Partner:  Caregiver Able to Provide Assistance: Yes  Type of Caregiver Assistance: Cognitive  Recommendation:  None  Rationale for SLP Follow Up: Maximize cognitive function  and independence   Equipment:  none  Reasons for discharge: Treatment goals met;Discharged from hospital   Patient/Family Agrees with Progress Made and Goals Achieved: Yes    Renaee Munda 01/08/2023, 3:11 PM

## 2023-01-09 ENCOUNTER — Other Ambulatory Visit (HOSPITAL_COMMUNITY): Payer: Self-pay

## 2023-01-09 LAB — GLUCOSE, CAPILLARY: Glucose-Capillary: 168 mg/dL — ABNORMAL HIGH (ref 70–99)

## 2023-01-09 NOTE — Progress Notes (Signed)
PROGRESS NOTE   Subjective/Complaints:  Pt got paperwork Pt reports no issues- ready for d/c Still refuses SSRI/SNRI ROS:   Pt denies SOB, abd pain, CP, N/V/C/D, and vision changes   Objective:   No results found. Recent Labs    01/07/23 0659  WBC 13.7*  HGB 11.4*  HCT 36.3  PLT 355    Recent Labs    01/07/23 0659  NA 134*  K 4.2  CL 102  CO2 24  GLUCOSE 210*  BUN 14  CREATININE 0.98  CALCIUM 8.9      Intake/Output Summary (Last 24 hours) at 01/09/2023 0734 Last data filed at 01/08/2023 1759 Gross per 24 hour  Intake 780 ml  Output --  Net 780 ml        Physical Exam: Vital Signs Blood pressure 108/78, pulse 95, temperature 98.2 F (36.8 C), resp. rate 16, height 5\' 2"  (1.575 m), weight 95.2 kg, SpO2 96%.      General: awake, alert, flat affect,NAD HENT: conjugate gaze; oropharynx moist CV: regular rate and rhythm; no JVD Pulmonary: CTA B/L; no W/R/R- good air movement GI: soft, NT, ND, (+)BS Psychiatric: appropriate but extremely flat Neurological: was awake today- slightly delayed Ext: no clubbing, cyanosis, or edema Psych: pleasant and cooperative, still somewhat flat Skin: No evidence of breakdown, no evidence of rash  PRIOR EXAMS: Neuro: Alert and oriented x 3, she is able to provide a clear and coherent history, follows commands, cranial nerves II through XII intact besides shoulder shrug is a little decreased on the right, able to name and repeat, no dysarthria or aphasia noted RUE: 4-/5, RLE 4- to 4/5. LUE and LLE 5/5.  Sensory exam normal for light touch and pain in all 4 limbs.  No abnormal tone appreciated.       Assessment/Plan: 1. Functional deficits which require 3+ hours per day of interdisciplinary therapy in a comprehensive inpatient rehab setting. Physiatrist is providing close team supervision and 24 hour management of active medical problems listed  below. Physiatrist and rehab team continue to assess barriers to discharge/monitor patient progress toward functional and medical goals  Care Tool:  Bathing    Body parts bathed by patient: Right arm, Left arm, Chest, Abdomen, Front perineal area, Buttocks, Right upper leg, Left upper leg, Right lower leg, Left lower leg, Face   Body parts bathed by helper: Buttocks, Left lower leg, Right lower leg     Bathing assist Assist Level: Supervision/Verbal cueing     Upper Body Dressing/Undressing Upper body dressing   What is the patient wearing?: Bra, Pull over shirt    Upper body assist Assist Level: Set up assist    Lower Body Dressing/Undressing Lower body dressing      What is the patient wearing?: Underwear/pull up, Pants     Lower body assist Assist for lower body dressing: Supervision/Verbal cueing     Toileting Toileting    Toileting assist Assist for toileting: Set up assist     Transfers Chair/bed transfer  Transfers assist     Chair/bed transfer assist level: Independent with assistive device Chair/bed transfer assistive device: Geologist, engineering   Ambulation assist  Assist level: Supervision/Verbal cueing Assistive device: Walker-rolling Max distance: 688 ft   Walk 10 feet activity   Assist     Assist level: Independent with assistive device Assistive device: Walker-rolling   Walk 50 feet activity   Assist    Assist level: Supervision/Verbal cueing Assistive device: Walker-rolling    Walk 150 feet activity   Assist Walk 150 feet activity did not occur: Safety/medical concerns  Assist level: Supervision/Verbal cueing Assistive device: Walker-rolling    Walk 10 feet on uneven surface  activity   Assist Walk 10 feet on uneven surfaces activity did not occur: Safety/medical concerns   Assist level: Supervision/Verbal cueing Assistive device: Walker-rolling   Wheelchair     Assist Is the patient  using a wheelchair?: No Type of Wheelchair: Manual    Wheelchair assist level: Dependent - Patient 0%      Wheelchair 50 feet with 2 turns activity    Assist        Assist Level: Dependent - Patient 0%   Wheelchair 150 feet activity     Assist      Assist Level: Dependent - Patient 0%   Blood pressure 108/78, pulse 95, temperature 98.2 F (36.8 C), resp. rate 16, height 5\' 2"  (1.575 m), weight 95.2 kg, SpO2 96%.   Medical Problem List and Plan: 1. 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  Antithrombotics: -DVT/anticoagulation:  Pharmaceutical: Lovenox 40mg  daily             -antiplatelet therapy: DAPT X 3 weeks followed by ASA alone 3. Pain Management:  N/A 4. Mood/Behavior/Sleep: LCSW to follow for evaluation and support             -antipsychotic agents: N/A  -Melatonin PRN -9/6 pt does not want to be on antidepressant--mood seems a little better this week   -requested neuropsych input   -team continues to provide ego support  9/9- seeing neuropsych today  9/11- pt still depressed-- but refuses anti-depressant 5. Neuropsych/cognition: This patient is capable of making decisions on her own behalf. 6. Skin/Wound Care: Routine pressure relief measures.  7. Fluids/Electrolytes/Nutrition: Monitor I/O. Check CMET in am.  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 On Tresiba 16 units w/ Novolog 20 units TID ac/ Endo @ [facility]             --Continue Insulin gargline 30 units with 15 units TID ac --Monitor BS ac/hs and continue to educate on compliance, risk factors/benefits. She didn't take insulin consistently and does not wear CGM due to "excessive beeping" -8/27- CBGs 195-221- will increase Semglee to 33 units daily- first dose in AM -8/28- CBGs 182 this AM- but hasn't started  new dosing of Semglee- will monitor for trend.  -8/29- CBGs 160-278- worse this AM- will increase Semglee to 37 units in AM- won't get until tomorrow AM -8/30- CBGs better today- in low to mid 200s- down from 300s- con't regimen but will need changes this weekend -12/29/22 CBGs 220s-280s, overall better, just started increased dose yesterday; would monitor for trend before more adjustments made 9/2- CBGs 227-395- eating a LOT of snacks- will increase Semglee to 40 units daily- I don't think we will get good control-  she just eats more snacks- and nursing and I have spoken with her- she has no desire to get better control.   9/6 semglee transitioned to 30u qam and 20 u qpm--fully effective today  -numbers showing improvement  -dietary ed -01/05/23 CBGs generally better, cont regimen -01/06/23 CBGs a little worse today, but just recently changed meds; might need further adjustment but will defer to weekday team CBG (last 3)   9/9- increased Semglee to 33 units in AM since day CBGs worse than when she wakes up. Got to 373 last evening as wlel, so will likely need to increase night insulin as well  9/10- CBGs 175-220- doing better- will increase night insulin tonight. Increased to 23 units nightly  9/11- CBGs look better this AM/last 24 hours- send home on current regimen Recent Labs    01/08/23 1640 01/08/23 2146 01/09/23 0623  GLUCAP 194* 171* 168*       9. HTN: Monitor BP TID- continue to hold Lisinopril and hydrochlorothiazide.  Long-term goal normotensive --BP controlled currently  -01/05/23 BP controlled  -01/06/23 BP generally better, one isolated lower reading but better now.  9/9- Controlled with 1 soft reading-con't regimen 9/10- much better control Vitals:   01/06/23 0519 01/06/23 0532 01/06/23 0600 01/06/23 1408  BP: 125/67 (!) 96/59 110/74 100/71   01/06/23 1839 01/07/23 0409 01/07/23 1331 01/07/23 1849  BP: 115/65 117/83 120/78 130/65   01/08/23 0515 01/08/23 1534 01/08/23 2101  01/09/23 0509  BP: 121/72 132/85 128/79 108/78    10. Hyponatremia: Question chronic @131 -132 range per chart review             --recheck BMET in am   8/27- Na 135  9/9- Na 134 11. Hyperlipidemia: Trig-245/LDL-180. Now on increased dose Crestor 40mg .   Dietary education 12. Hypomagnesemia: Recheck in am. Now on Mag ox 400mg  daily. 8/27- Will check Mg in AM  8/28- mg hasn't bene done- not clear if pt refused labs or what.  8/29- Mg up to 2.0-  13. Chronic leucocytosis: Trend WBC and monitor for signs of infection.              --improved from 13.4 to 11.9  8/27- will check U/ And Cx and CXR because up to 16k 8/28- U/ A negative- has large bacteria, which could be colonization but otherwise negative- CXR negative.   8/29- feels well- will recheck in AM just to monitor and weekly  8/30- down to 12.4  9/2- 13.4k- labs pending for Monday  9/9- WBC 13.7- but has been stalbe for her 14. Morbid obesity: BMI- 39.4. CM/HH diet. RD consulted to review diet.  --Educate patient on importance of weight loss and exercise to help promote health, improve AIC and mobility.   9/11- pt not interested in her dietary education 15. Constipation: Declines need for laxative. Advised that prns on board. Monitor for now.   - Senna-S 2 tab at bedtime-  -LBM 01/05/23 9/9- LBM yesterday 9/11- LBM yesterday      LOS: 16 days A FACE TO FACE EVALUATION WAS PERFORMED  Arad Burston 01/09/2023, 7:34 AM

## 2023-01-09 NOTE — Progress Notes (Signed)
Patient ID: Lisa Werner, female   DOB: 01-10-1981, 42 y.o.   MRN: [id number]  SW met with pt and pt father in room to follow-up about DME. Repots she was able to get a RW but not TTB. SW reminded her to call [employer] to inform on having the item shipped to the home. SW reviewed discharge.   Cecile Sheerer, MSW, LCSWA Office: [phone] Cell: [phone] Fax: [phone]

## 2023-01-09 NOTE — Progress Notes (Signed)
Inpatient Rehabilitation Care Coordinator Discharge Note   Patient Details  Name: Lisa Werner MRN: [id number] Date of Birth: 10-Jul-1980   Discharge location: D/c to her father's home  Length of Stay: 15 days  Discharge activity level: Supervision  Home/community participation: Limited  Patient response WU:JWJXBJ Literacy - How often do you need to have someone help you when you read instructions, pamphlets, or other written material from your doctor or pharmacy?: Never  Patient response YN:WGNFAO Isolation - How often do you feel lonely or isolated from those around you?: Never  Services provided included: MD, RD, PT, OT, SLP, RN, TR, Pharmacy, SW, Neuropsych, CM  Financial Services:  Field seismologist Utilized: [employer]  Choices offered to/list presented to: patient  Follow-up services arranged:  Outpatient, DME    Outpatient Servicies: [facility] for PT/OT/SLP DME : Pt needs to make copay for DME. Pt stated has RW but not TTB,. SW encouraged her to call [employer] to have item delivered to home.    Patient response to transportation need: Is the patient able to respond to transportation needs?: Yes In the past 12 months, has lack of transportation kept you from medical appointments or from getting medications?: No In the past 12 months, has lack of transportation kept you from meetings, work, or from getting things needed for daily living?: No   Patient/Family verbalized understanding of follow-up arrangements:  Yes  Individual responsible for coordination of the follow-up plan: contact pt [phone]  Confirmed correct DME delivered: Gretchen Short 01/09/2023    Comments (or additional information):fam edu completed  Summary of Stay    Date/Time Discharge Planning CSW  01/07/23 1010 Pt discharge to home- and her father and stepmother will help assist. Pt has the option to go to her father's home as well. Father works,  step-mother can provide supervision only. Family edu scheudled for Monday (9/9/) 9am-12pm with pt father. DME- RW and TTB ordered. Pt will cancel order if she has access to these items. Outpatient PT/OT/SLP at [facility]. SW will confirm there are no barriers to discharge. AAC  01/01/23 0934 Pt discharge to home- and her father and stepmother will help assist. No contact with any family member at this time. SW will confirm there are no barriers to discharge. AAC  12/24/22 1340 TBA. Per EMR, pt to discharge to home- and her father and stepmother will help assist. SW will confirm there are no barriers to discharge. AAC       Dannika Hilgeman A Lula Olszewski

## 2023-01-21 ENCOUNTER — Other Ambulatory Visit: Payer: Self-pay

## 2023-01-21 ENCOUNTER — Ambulatory Visit: Payer: BC Managed Care – PPO | Admitting: Physical Therapy

## 2023-01-21 ENCOUNTER — Encounter: Payer: Self-pay | Admitting: Physical Therapy

## 2023-01-21 VITALS — BP 122/92 | HR 89

## 2023-01-21 DIAGNOSIS — R2689 Other abnormalities of gait and mobility: Secondary | ICD-10-CM

## 2023-01-21 NOTE — Therapy (Addendum)
[facility] [address] [place], [place], [address] Phone: [phone]   Fax:  [phone]  Patient Details  Name: Lisa Werner MRN: [id number] Date of Birth: Nov 04, 1980 Referring Provider:  Jerene Pitch  Encounter Date: 01/21/2023  Session arrive no charge given uncontrolled diabetes with suspected blood glucose out of range given patient's presentation out of session/patient's inability to continue session due to fatigue. 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 Patient arrives to session with difficulty maintain alertness/falling asleep repeatedly. Patient states she has only eaten peaches all day and has not been checking blood glucose just guessing how much insulin she needs based on what she needs. States she has a monitor at home just needs to charge it. Patient arrives to session with father. Pt educates patient and father on need to regularly check and encourages to go home and check and go to ED if unable to control/follow up with PCP. Patient and father verbalize understanding. Patient provided crackers as had no other food today; denies lack of food access.   Carmelia Bake, PT, DPT 01/21/2023, 2:19 PM  [facility] [address] [place], [place], [address] Phone: [phone]   Fax:  [phone]

## 2023-01-27 NOTE — Therapy (Signed)
OUTPATIENT OCCUPATIONAL THERAPY NEURO EVALUATION  Patient Name: Lisa Werner MRN: [id number] DOB:1980/11/03, 42 y.o., female Today's Date: 01/28/2023  PCP: Dr. Julieanne Manson REFERRING PROVIDER: Delle Reining, [name]  END OF SESSION:  OT End of Session - 01/28/23 1710     Visit Number 1    Number of Visits 17    Date for OT Re-Evaluation 03/29/23    Authorization Type [employer] covered 100%, no visit limit, no auth    OT Start Time 1408    OT Stop Time 1445    OT Time Calculation (min) 37 min    Activity Tolerance Patient tolerated treatment well    Behavior During Therapy WFL for tasks assessed/performed             Past Medical History:  Diagnosis Date   Diabetes mellitus without complication (HCC)    Hyperlipidemia    Hypertension    History reviewed. No pertinent surgical history. Patient Active Problem List   Diagnosis Date Noted   LADA (latent autoimmune diabetes in adults), managed as type 1 (HCC) 12/24/2022   Left pontine stroke (HCC) 12/24/2022   CVA (cerebral vascular accident) (HCC) 12/20/2022   Acute ischemic stroke (HCC) 12/19/2022   Leukocytosis 12/19/2022   Acute right-sided weakness 12/19/2022   Essential hypertension 02/21/2019   Noncompliance with diabetes treatment 07/17/2017   Mixed hyperlipidemia 06/03/2017   Pain of left eye 06/03/2017   Urinary frequency 06/03/2017   DM2 (diabetes mellitus, type 2) (HCC) 09/28/2013    ONSET DATE: 12/19/22 hospitalized (weakness x4 days prior)  REFERRING DIAG: I69.30 (ICD-10-CM) - Unspecified sequelae of cerebral infarction  THERAPY DIAG:  Other lack of coordination  Muscle weakness (generalized)  Unsteadiness on feet  Attention and concentration deficit  Other symptoms and signs involving cognitive functions following cerebral infarction  Rationale for Evaluation and Treatment: Rehabilitation  SUBJECTIVE:   SUBJECTIVE STATEMENT: Pt reports R-sided weakness, fatigue, can't walk as  well.  Pt reports that blood sugar was approx 180 after meals today.    Pt accompanied by: self  PERTINENT HISTORY: Left pontine stroke with R-sided weakness and cognitive deficits.  Hospitalized 12/19/22-01/09/23.  PMH:    Mixed hyperlipidemia,  Essential hypertension, LADA (latent autoimmune diabetes in adults), managed as type 1 (HCC), Persistent leucocytosis ,Hyponatremia   PRECAUTIONS: Fall and Other: no driving  WEIGHT BEARING RESTRICTIONS: No  PAIN:  Are you having pain? No  FALLS: Has patient fallen in last 6 months? Yes. Number of falls 2-3 falls before she knew she had the stroke  LIVING ENVIRONMENT: Lives with:  staying with father currently, but lived alone with 78 y.o. son prior   Lives in: Other condo Stairs: Yes: External: 3 steps; can reach both Has following equipment at home: Environmental consultant - 4 wheeled and shower chair  PLOF: Independent, Vocation/Vocational requirements: middle Estate agent, and Leisure: enjoys drawing, teaching   PATIENT GOALS: to get back to "my regular self"  OBJECTIVE:   HAND DOMINANCE: Right  ADLs:  Transfers/ambulation related to ADLs:  mod I Eating: some drops, can open water bottle now Grooming: mod I UB Dressing: mod I, sometimes shirt gets tangled  LB Dressing: difficulty with pants, unsure about tying  Toileting: mod I, but difficulty with hygiene with R hand  Bathing: mod I Tub Shower transfers: mod I Equipment: Shower seat with back  IADLs: Shopping: needs transportation, mod I in store Light housekeeping: pt folding clothes, but family assisting Meal Prep: family performing Community mobility: unable to drive currently Medication management:  father sets up meds, pt doing finger sticks and insulin Financial management: mod I per pt  Handwriting:  difficulty, incr time   MOBILITY STATUS: Hx of falls and rollator for longer distances, holding onto wall right now in the home  POSTURE COMMENTS:  rounded shoulders  and weight shift right Sitting balance:  sits leaning to R side  ACTIVITY TOLERANCE: Activity tolerance: fatigues quickly  FUNCTIONAL OUTCOME MEASURES: FOTO: Not captured today due to difficulty logging on  UPPER EXTREMITY ROM:  BUEs grossly WNL except RUE mild decr wrist ext, finger ext, and supination   UPPER EXTREMITY MMT:     MMT Right eval Left eval  Shoulder flexion 4-/5 5/5  Shoulder abduction 4-/5 5/5  Shoulder adduction 4-/5 5/5  Shoulder extension    Shoulder internal rotation    Shoulder external rotation    Middle trapezius    Lower trapezius    Elbow flexion 4-/5 5/5  Elbow extension 4-/5 5/5  Wrist flexion    Wrist extension    Wrist ulnar deviation    Wrist radial deviation    Wrist pronation    Wrist supination    (Blank rows = not tested)  HAND FUNCTION: Grip strength: Right: 24.6 lbs; Left: 34.3 lbs  COORDINATION: 9 Hole Peg test: Right: 34.16 sec; Left: 26.50 sec  SENSATION: Pt reports mild numbness R hand which incr at time  EDEMA: none noted  MUSCLE TONE: BUEs WNL  COGNITION: Overall cognitive status:  reports that it is harder to focus  VISION: Subjective report: Pt denies changes.   Baseline vision: Wears glasses for reading only    TODAY'S TREATMENT:                                                                                                                               Evaluation completed today   PATIENT EDUCATION: Education details: OT eval results, discussed POC, emphasized importance of monitoring blood sugar  Person educated: Patient Education method: Explanation Education comprehension: verbalized understanding  HOME EXERCISE PROGRAM: Not yet instructed   GOALS: Goals reviewed with patient? Yes  SHORT TERM GOALS: Target date: 02/27/23  Pt will be independent with initial HEP for RUE coordination and strength. Goal status: INITIAL  2.  Pt will perform simple cooking tasks with supervision. Goal status:  INITIAL  3.  Pt will perform simple home maintenance tasks mod I. Goal status: INITIAL  4.   Pt will improve R grip strength by at least 10lbs for opening containers. Baseline:  24.6lbs Goal status: INITIAL  5.  Pt will improve coordination for ADLs as shown by completing 9-hole peg test in 30sec or less with dominant RUE. Baseline:  34.16sec Goal status: INITIAL  6.  Pt will be able to perform simple drawing with AE/compensation strategies. Goal status: INITIAL   LONG TERM GOALS: Target date: 03/29/23  Pt will be independent with updated HEP for RUE coordination and strength. Baseline:  Goal status:  INITIAL  2.  Pt will perform simple cooking tasks mod I. Baseline:  Goal status: INITIAL  3.  Pt will improve R grip strength by at least 10lbs for opening containers. Baseline:  24.6 Goal status: INITIAL  4.  Pt will improve coordination for ADLs as shown by completing 9-hole peg test in 30sec or less with dominant RUE. Baseline:  34.16sec Goal status: INITIAL  5.  Pt will complete mod complex home maintenance tasks mod I. Goal status: INITIAL  6.  Assess FOTO and establish goal. Baseline: not yet tested Goal status: INITIAL  ASSESSMENT:  CLINICAL IMPRESSION: Patient is a 42 y.o. female who was seen today for occupational therapy evaluation for CVA.   PMH includes:    Mixed hyperlipidemia,  Essential hypertension, LADA (latent autoimmune diabetes in adults), managed as type 1 (HCC), Persistent leucocytosis ,Hyponatremia.  Pt presents today with decr coordination, decr strength, decr balance for ADLs, decr activity tolerance, and cognitive deficits.  Pt was independent prior to CVA and was working as a Clinical cytogeneticist.  Pt has a 27 y.o. son and was previously living alone.  Pt currently unable to work or drive and needs assistance for IADLs.  Pt would benefit from occupational therapy to address these deficits for improved dominant RUE functional use and incr  independence with ADLs/IADLs to return to prior level of function.   PERFORMANCE DEFICITS: in functional skills including ADLs, IADLs, coordination, dexterity, sensation, ROM, strength, Fine motor control, balance, endurance, decreased knowledge of use of DME, and UE functional use, cognitive skills including attention, and psychosocial skills including routines and behaviors.   IMPAIRMENTS: are limiting patient from ADLs, IADLs, work, and leisure.   CO-MORBIDITIES: may have co-morbidities  that affects occupational performance. Patient will benefit from skilled OT to address above impairments and improve overall function.  MODIFICATION OR ASSISTANCE TO COMPLETE EVALUATION: Min-Moderate modification of tasks or assist with assess necessary to complete an evaluation.  OT OCCUPATIONAL PROFILE AND HISTORY: Detailed assessment: Review of records and additional review of physical, cognitive, psychosocial history related to current functional performance.  CLINICAL DECISION MAKING: Moderate - several treatment options, min-mod task modification necessary  REHAB POTENTIAL: Good  EVALUATION COMPLEXITY: Moderate    PLAN:  OT FREQUENCY: 2x/week  OT DURATION: 8 weeks  +evals   PLANNED INTERVENTIONS: self care/ADL training, therapeutic exercise, therapeutic activity, neuromuscular re-education, manual therapy, passive range of motion, balance training, functional mobility training, moist heat, cryotherapy, patient/family education, cognitive remediation/compensation, energy conservation, and DME and/or AE instructions  RECOMMENDED OTHER SERVICES: none at this time  CONSULTED AND AGREED WITH PLAN OF CARE: Patient  PLAN FOR NEXT SESSION: complete FOTO, initiate HEP   Pilar Westergaard, OTR/L 01/28/2023, 5:13 PM

## 2023-01-28 ENCOUNTER — Ambulatory Visit: Payer: BC Managed Care – PPO | Attending: Physical Medicine and Rehabilitation | Admitting: Occupational Therapy

## 2023-01-28 ENCOUNTER — Ambulatory Visit: Payer: BC Managed Care – PPO | Admitting: Physical Therapy

## 2023-01-28 ENCOUNTER — Encounter
Payer: BC Managed Care – PPO | Attending: Physical Medicine and Rehabilitation | Admitting: Physical Medicine and Rehabilitation

## 2023-01-28 ENCOUNTER — Encounter: Payer: Self-pay | Admitting: Physical Therapy

## 2023-01-28 ENCOUNTER — Encounter: Payer: Self-pay | Admitting: Occupational Therapy

## 2023-01-28 VITALS — BP 160/82

## 2023-01-28 DIAGNOSIS — R2689 Other abnormalities of gait and mobility: Secondary | ICD-10-CM

## 2023-01-28 DIAGNOSIS — R41840 Attention and concentration deficit: Secondary | ICD-10-CM | POA: Insufficient documentation

## 2023-01-28 DIAGNOSIS — R2681 Unsteadiness on feet: Secondary | ICD-10-CM | POA: Diagnosis present

## 2023-01-28 DIAGNOSIS — I69318 Other symptoms and signs involving cognitive functions following cerebral infarction: Secondary | ICD-10-CM | POA: Diagnosis present

## 2023-01-28 DIAGNOSIS — M6281 Muscle weakness (generalized): Secondary | ICD-10-CM

## 2023-01-28 DIAGNOSIS — R278 Other lack of coordination: Secondary | ICD-10-CM | POA: Diagnosis present

## 2023-01-28 NOTE — Therapy (Unsigned)
OUTPATIENT PHYSICAL THERAPY NEURO EVALUATION   Patient Name: Lisa Werner MRN: [id number] DOB:1981-01-12, 42 y.o., female Today's Date: 01/28/2023   PCP: Zoe Lan, MD REFERRING PROVIDER: Jacquelynn Cree, [name]  END OF SESSION:  PT End of Session - 01/28/23 1450     Visit Number 1    Authorization Type Blue Cross Blue [employer]    PT Start Time 1450    Equipment Utilized During Treatment Gait belt    Activity Tolerance Patient limited by fatigue;Patient tolerated treatment well    Behavior During Therapy Flat affect             Past Medical History:  Diagnosis Date   Diabetes mellitus without complication (HCC)    Hyperlipidemia    Hypertension    History reviewed. No pertinent surgical history. Patient Active Problem List   Diagnosis Date Noted   LADA (latent autoimmune diabetes in adults), managed as type 1 (HCC) 12/24/2022   Left pontine stroke (HCC) 12/24/2022   CVA (cerebral vascular accident) (HCC) 12/20/2022   Acute ischemic stroke (HCC) 12/19/2022   Leukocytosis 12/19/2022   Acute right-sided weakness 12/19/2022   Essential hypertension 02/21/2019   Noncompliance with diabetes treatment 07/17/2017   Mixed hyperlipidemia 06/03/2017   Pain of left eye 06/03/2017   Urinary frequency 06/03/2017   DM2 (diabetes mellitus, type 2) (HCC) 09/28/2013    ONSET DATE: 01/09/2023 (referral date)  REFERRING DIAG: I63.9 (ICD-10-CM) - Cerebral infarction, unspecified  THERAPY DIAG:  Other abnormalities of gait and mobility  Unsteadiness on feet  Rationale for Evaluation and Treatment: Rehabilitation  SUBJECTIVE:                                                                                                                                                                                             SUBJECTIVE STATEMENT: Patient found to have an acute infarct in left pons on 12/19/2022 per patient and chart review. Patient also known to have IDDM in 2007  and has struggled with controlling blood glucose. Patient reports that she did not need anything to hold onto prior to stroke. Patient currently using rollator for distance walks; she is not using anything for short distances in the house. Patient reports that since her stroke she has needed help cooking, washing clothes, sitting down. She feels like her endurance has been quite bad. Patient reports that she had 1 fall in the last 6 months. She had to [facility] called because she could not get up. Patient reports that she is not sure what caused the fall but her leg just gave out. Patient reports that when she first got home  her leg used to buckle but does not anymore. Patient reports that she is walking 2 stop sign and back a few times a week. Patient does require rest breaks with her walks. Reports intermittent outburst of laughter which occur intermittently during session.   Pt accompanied by: self  PERTINENT HISTORY: LAD/T IDDM, HTN, morbid obesity,acute infarct in left pons on 12/19/2022  PAIN:  Are you having pain? No  PRECAUTIONS: Monitor Sugar levels and fall  RED FLAGS: Bowel or bladder incontinence: Yes: reports constipation but is being medically managed    WEIGHT BEARING RESTRICTIONS: No  FALLS: Has patient fallen in last 6 months? Yes. 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  DIAGNOSTIC FINDINGS:     MR Brain wo contrast 12/19/2022 IMPRESSION: 1. Acute infarct in the left pons. 2. No intracranial large vessel occlusion or significant stenosis. 3. 1-2 mm laterally directed outpouching from the proximal right A2, which may represent a tiny aneurysm versus an infundibulum.    COGNITION: Overall cognitive status: Impaired (new since stroke)    SENSATION: Denies numbness and tingling  COORDINATION: Mild dymetria on RLE  EDEMA:  WFL   MUSCLE TONE: RLE: Mild  POSTURE: rounded shoulders and forward head  LOWER EXTREMITY ROM:     Reduced on RLE due to fluctuations on tone on this side with tendency toward mild extensor synergy of RLE  LOWER EXTREMITY MMT:    MMT Right Eval Left Eval  Hip flexion 4-/5 4/5  Hip extension    Hip abduction    Hip adduction    Hip internal rotation    Hip external rotation    Knee flexion 4-/5 4/5  Knee extension 4-/5 4/5  Ankle dorsiflexion 4-/5 4/5  Ankle plantarflexion    Ankle inversion    Ankle eversion    (Blank rows = not tested)  TRANSFERS: Assistive device utilized: Environmental consultant - 4 wheeled  Sit to stand: SBA Stand to sit: SBA Chair to chair: SBA Comments:   GAIT: Gait pattern: {gait characteristics:25376} Distance walked: *** Assistive device utilized: {Assistive devices:23999} Level of assistance: {Levels of assistance:24026} Comments: ***  FUNCTIONAL TESTS:    [facility] PT Assessment - 01/28/23 0001       Standardized Balance Assessment   Standardized Balance Assessment Five Times Sit to Stand;10 meter walk test;Timed Up and Go Test    Five times sit to stand comments  15.15   seconds without using UE (SBA)   10 Meter Walk 0.64   m/s with rollator (SBA)     Timed Up and Go Test   Normal TUG (seconds) 18.25   seconds with rollator (SBA - is not using breaks)            PATIENT SURVEYS:  FOTO: not captured on intake  VITALS:  Vitals:   01/28/23 1505  BP: (!) 160/82   TODAY'S TREATMENT:  PATIENT EDUCATION: Education details: *** Person educated: {Person educated:25204} Education method: {Education Method:25205} Education comprehension: {Education Comprehension:25206}  HOME EXERCISE PROGRAM: ***  GOALS: Goals reviewed with patient? {yes/no:20286}  SHORT TERM GOALS: Target date: ***  *** Baseline: Goal status: INITIAL  2.  *** Baseline:  Goal status: INITIAL  3.  *** Baseline:  Goal status: INITIAL  4.  *** Baseline:  Goal status: INITIAL  5.  *** Baseline:  Goal status: INITIAL  6.  *** Baseline:  Goal status: INITIAL  LONG TERM GOALS: Target date: ***  *** Baseline:  Goal status: INITIAL  2.  *** Baseline:  Goal status: INITIAL  3.  *** Baseline:  Goal status: INITIAL  4.  *** Baseline:  Goal status: INITIAL  5.  *** Baseline:  Goal status: INITIAL  6.  *** Baseline:  Goal status: INITIAL  ASSESSMENT:  CLINICAL IMPRESSION: Patient is a *** y.o. *** who was seen today for physical therapy evaluation and treatment for ***.   OBJECTIVE IMPAIRMENTS: {opptimpairments:25111}.   ACTIVITY LIMITATIONS: {activitylimitations:27494}  PARTICIPATION LIMITATIONS: {participationrestrictions:25113}  PERSONAL FACTORS: {Personal factors:25162} are also affecting patient's functional outcome.   REHAB POTENTIAL: {rehabpotential:25112}  CLINICAL DECISION MAKING: {clinical decision making:25114}  EVALUATION COMPLEXITY: {Evaluation complexity:25115}  PLAN:  PT FREQUENCY: {rehab frequency:25116}  PT DURATION: {rehab duration:25117}  PLANNED INTERVENTIONS: {rehab planned interventions:25118::"Therapeutic exercises","Therapeutic activity","Neuromuscular re-education","Balance training","Gait training","Patient/Family education","Self Care","Joint mobilization"}  PLAN FOR NEXT SESSION: ***   Carmelia Bake, PT,  DPT 01/28/2023, 3:48 PM

## 2023-01-29 ENCOUNTER — Encounter: Payer: Self-pay | Admitting: Physical Therapy

## 2023-02-01 ENCOUNTER — Ambulatory Visit: Payer: BC Managed Care – PPO | Attending: Physical Medicine and Rehabilitation | Admitting: Physical Therapy

## 2023-02-01 ENCOUNTER — Encounter: Payer: Self-pay | Admitting: Physical Therapy

## 2023-02-01 VITALS — BP 125/86 | HR 90

## 2023-02-01 DIAGNOSIS — M6281 Muscle weakness (generalized): Secondary | ICD-10-CM

## 2023-02-01 DIAGNOSIS — R41840 Attention and concentration deficit: Secondary | ICD-10-CM | POA: Diagnosis present

## 2023-02-01 DIAGNOSIS — R2689 Other abnormalities of gait and mobility: Secondary | ICD-10-CM | POA: Diagnosis present

## 2023-02-01 DIAGNOSIS — R2681 Unsteadiness on feet: Secondary | ICD-10-CM

## 2023-02-01 DIAGNOSIS — I69318 Other symptoms and signs involving cognitive functions following cerebral infarction: Secondary | ICD-10-CM | POA: Diagnosis present

## 2023-02-01 DIAGNOSIS — R41841 Cognitive communication deficit: Secondary | ICD-10-CM | POA: Diagnosis present

## 2023-02-01 DIAGNOSIS — R208 Other disturbances of skin sensation: Secondary | ICD-10-CM | POA: Diagnosis present

## 2023-02-01 DIAGNOSIS — R278 Other lack of coordination: Secondary | ICD-10-CM | POA: Diagnosis present

## 2023-02-01 NOTE — Therapy (Signed)
OUTPATIENT PHYSICAL THERAPY NEURO TREATMENT   Patient Name: Lisa Werner MRN: [id number] DOB:[birth year], 42 y.o., female Today's Date: 02/01/2023   PCP: Zoe Lan, MD REFERRING PROVIDER: Jacquelynn Cree, [name]  END OF SESSION:  PT End of Session - 02/01/23 0857     Visit Number 2    Number of Visits 13    Date for PT Re-Evaluation 03/25/23    Authorization Type Blue Cross [place]    PT Start Time [phone]    PT Stop Time 0930    PT Time Calculation (min) 35 min    Equipment Utilized During Treatment Gait belt    Activity Tolerance Patient tolerated treatment well    Behavior During Therapy WFL for tasks assessed/performed             Past Medical History:  Diagnosis Date   Diabetes mellitus without complication (HCC)    Hyperlipidemia    Hypertension    History reviewed. No pertinent surgical history. Patient Active Problem List   Diagnosis Date Noted   LADA (latent autoimmune diabetes in adults), managed as type 1 (HCC) 12/24/2022   Left pontine stroke (HCC) 12/24/2022   CVA (cerebral vascular accident) (HCC) 12/20/2022   Acute ischemic stroke (HCC) 12/19/2022   Leukocytosis 12/19/2022   Acute right-sided weakness 12/19/2022   Essential hypertension 02/21/2019   Noncompliance with diabetes treatment 07/17/2017   Mixed hyperlipidemia 06/03/2017   Pain of left eye 06/03/2017   Urinary frequency 06/03/2017   DM2 (diabetes mellitus, type 2) (HCC) 09/28/2013    ONSET DATE: 01/09/2023 (referral date)  REFERRING DIAG: I63.9 (ICD-10-CM) - Cerebral infarction, unspecified  THERAPY DIAG:  Other abnormalities of gait and mobility  Unsteadiness on feet  Muscle weakness (generalized)  Rationale for Evaluation and Treatment: Rehabilitation  SUBJECTIVE:                                                                                                                                                                                             SUBJECTIVE  STATEMENT: Patient arrives to session and states sugars were 180 this morning. She now has continuous monitor in place. Denies falls/near falls since last here for PT.   Pt accompanied by: self  PERTINENT HISTORY: LAD/T IDDM, HTN, morbid obesity, acute infarct in left pons on 12/19/2022  PAIN:  Are you having pain? No  PRECAUTIONS: Monitor Sugar levels and fall  RED FLAGS: Bowel or bladder incontinence: Yes: reports constipation but is being medically managed    WEIGHT BEARING RESTRICTIONS: No  FALLS: Has patient fallen in last 6 months? Yes. 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  DIAGNOSTIC FINDINGS:   MR Brain wo contrast 12/19/2022 IMPRESSION: 1. Acute infarct in the left pons. 2. No intracranial large vessel occlusion or significant stenosis. 3. 1-2 mm laterally directed outpouching from the proximal right A2, which may represent a tiny aneurysm versus an infundibulum.    TODAY'S TREATMENT:                                                                                                                               VITALS:  Vitals:   02/01/23 0902  BP: 125/86  Pulse: 90   TherAct:   [facility] PT Assessment - 02/01/23 0001       6 minute walk test results    Aerobic Endurance Distance Walked 938   feet with rollator (SBA)   Endurance additional comments RPE: 7/10 demonstrates increased  trendelenburg with ambulation            Briefly reviewed safety with AD and recommend patient continue to use at this time   TherEx:  Sit to stand without UE support x 10 Staggered sit to stand with RLE back without UE support x 10 Semintandem stance corner balance 2 x 30 seconds   PATIENT EDUCATION: Education details: POC, goal collaboration, examination findings Person educated: Patient Education method: Explanation Education comprehension: verbalized understanding and needs further education  HOME EXERCISE PROGRAM: Access Code: [email or web address] URL: [email or web address] Date: 02/01/2023 Prepared by: Maryruth Eve  Exercises - Staggered Sit-to-Stand  - 1 x daily - 7 x weekly - 2 sets - 10 reps - Semi-Tandem Corner Balance With Eyes Closed  - 1 x daily - 7 x weekly - 3 sets - 30 seconds hold  GOALS: Goals reviewed with patient? Yes  SHORT TERM GOALS: Target date: 02/19/2023  Patient will demonstrate independence with initial HEP to continue to progress between physical therapy sessions.   Baseline: To be prov Goal status: INITIAL  2.  Patient will improve gait speed to 0.74 m/s with LRAD to indicate improvement towards the level of community ambulator in order to participate more easily in activities outside of the home.   Baseline: 0.64 m/s with rollator Goal status: INITIAL  3.  to be evaluated and LTG written as indicated Baseline: 938 feet with rollator RPE 7/10 Goal status: MET   LONG TERM GOALS: Target date: 03/25/2023  Patient will demonstrate independence with initial HEP  to continue to progress between physical therapy sessions.   Baseline: To be prov Goal status: INITIAL  2.  Patient will improve gait speed to 0.84 m/s with LRAD to indicate improvement to the level of community ambulator in order to participate more easily in activities outside of the home.   Baseline: 0.64 m/s with rollator Goal status: INITIAL  3.  Patient will improve  to 1030 feet with LRAD to demonstrate improved aerobic capacity and endurance in order to participate more easily in daily walking tasks.   Baseline: 938 feet with rollator RPE 7/10 Goal status: MET  4. Patient will improve their 5x Sit to Stand score to less than 12 seconds to demonstrate a decreased risk for falls and improved LE strength.   Baseline: 15.15 seconds with UE use Goal status: INITIAL  5.  Patient will improve TUG score to 15 seconds with LRAD to indicate a decreased risk of falls and demonstrate improved overall mobility.   Baseline: 18.25 seconds with rollator (SBA) Goal status: INITIAL  ASSESSMENT:  CLINICAL IMPRESSION: Skilled physical therapy session emphasized assessment of and HEP. Patient continues to demonstrate poor safety awareness with rollator and had to use clinic rollator as forgot to bring her own. Continue to educate on safety. Patient demonstrates increase in trendelenburg with R lean and mild R circumduction of RLE. Patient will benefit from skilled physical therapy to address impairment and progress towards LTGs. Continue POC.   OBJECTIVE IMPAIRMENTS: Abnormal gait, decreased balance, decreased endurance, decreased mobility, difficulty walking, decreased ROM, decreased strength, and pain.   ACTIVITY LIMITATIONS: carrying, bending, standing, transfers, and locomotion level  PARTICIPATION LIMITATIONS: meal prep, cleaning, medication management, community activity, and yard work  PERSONAL FACTORS: Time since onset of injury/illness/exacerbation and 3+ comorbidities: see above  are also affecting patient's functional outcome.   REHAB POTENTIAL: Fair will require proper management of blood sugar in order to progress  CLINICAL DECISION MAKING: Evolving/moderate complexity  EVALUATION COMPLEXITY: Moderate  PLAN:  PT FREQUENCY: 2x/week  PT DURATION: 6 weeks  PLANNED INTERVENTIONS: Therapeutic exercises, Therapeutic activity, Neuromuscular  re-education, Balance training, Gait training, Patient/Family education, Self Care, Aquatic Therapy, Manual therapy, and Re-evaluation  PLAN FOR NEXT SESSION:  add to HEP with emphasis on balance and NMR for R side, training with LRAD, high level strength and balance work, work on Field seismologist with transfers with rollator and fix rollator breaks if possible?, monitor blood sugar  Carmelia Bake, PT, DPT 02/01/2023, 11:50 AM

## 2023-02-04 ENCOUNTER — Ambulatory Visit: Payer: BC Managed Care – PPO | Admitting: Physical Therapy

## 2023-02-04 ENCOUNTER — Ambulatory Visit: Payer: BC Managed Care – PPO | Admitting: Speech Pathology

## 2023-02-04 ENCOUNTER — Encounter: Payer: Self-pay | Admitting: Speech Pathology

## 2023-02-04 ENCOUNTER — Encounter: Payer: Self-pay | Admitting: Physical Therapy

## 2023-02-04 VITALS — BP 130/88 | HR 94

## 2023-02-04 DIAGNOSIS — R2689 Other abnormalities of gait and mobility: Secondary | ICD-10-CM

## 2023-02-04 DIAGNOSIS — M6281 Muscle weakness (generalized): Secondary | ICD-10-CM

## 2023-02-04 DIAGNOSIS — R2681 Unsteadiness on feet: Secondary | ICD-10-CM

## 2023-02-04 DIAGNOSIS — R41841 Cognitive communication deficit: Secondary | ICD-10-CM

## 2023-02-04 NOTE — Therapy (Signed)
OUTPATIENT SPEECH LANGUAGE PATHOLOGY EVALUATION   Patient Name: Lisa Werner MRN: [id number] DOB:Jul 12, 1980, 42 y.o., female Today's Date: 02/04/2023  PCP: Julieanne Manson MD REFERRING PROVIDER: Jacquelynn Cree, [name]  END OF SESSION:  End of Session - 02/04/23 1457     Visit Number 1    Number of Visits 1    SLP Start Time 1400    SLP Stop Time  1435    SLP Time Calculation (min) 35 min    Activity Tolerance Patient tolerated treatment well             Past Medical History:  Diagnosis Date   Diabetes mellitus without complication (HCC)    Hyperlipidemia    Hypertension    History reviewed. No pertinent surgical history. Patient Active Problem List   Diagnosis Date Noted   LADA (latent autoimmune diabetes in adults), managed as type 1 (HCC) 12/24/2022   Left pontine stroke (HCC) 12/24/2022   CVA (cerebral vascular accident) (HCC) 12/20/2022   Acute ischemic stroke (HCC) 12/19/2022   Leukocytosis 12/19/2022   Acute right-sided weakness 12/19/2022   Essential hypertension 02/21/2019   Noncompliance with diabetes treatment 07/17/2017   Mixed hyperlipidemia 06/03/2017   Pain of left eye 06/03/2017   Urinary frequency 06/03/2017   DM2 (diabetes mellitus, type 2) (HCC) 09/28/2013    ONSET DATE: 12/19/22   REFERRING DIAG: I63.9 (ICD-10-CM) - Cerebral infarction, unspecified  THERAPY DIAG:  Cognitive communication deficit  Rationale for Evaluation and Treatment: Rehabilitation  SUBJECTIVE:   SUBJECTIVE STATEMENT: "She said I could graduate from ST" Pt accompanied by: self  PERTINENT HISTORY: Left pontine stroke with R-sided weakness and cognitive deficits.  Hospitalized 12/19/22-01/09/23.  PMH:    Mixed hyperlipidemia,  Essential hypertension, LADA (latent autoimmune diabetes in adults), managed as type 1 (HCC), Persistent leucocytosis ,Hyponatremia   PAIN:  Are you having pain? No  FALLS: Has patient fallen in last 6 months?  See PT evaluation  for details  LIVING ENVIRONMENT: Lives with: lives with their family Lives in: House/apartment  PLOF:  Level of assistance: Independent with ADLs, Independent with IADLs Employment: Full-time employment  PATIENT GOALS: "To drive again"  OBJECTIVE:  Note: Objective measures were completed at Evaluation unless otherwise noted.    COGNITION: Overall cognitive status: Impaired Areas of impairment:  Executive function: Impaired: Self-correction Functional deficits: N/A  COGNITIVE COMMUNICATION: Following directions: Follows multi-step commands consistently  Auditory comprehension: WFL Verbal expression: WFL Functional communication: WFL  ORAL MOTOR EXAMINATION: Overall status: WFL Comments:   STANDARDIZED ASSESSMENTS: CLQT: Attention: WNL, Memory: WNL, Executive Function: Mild, Language: WNL, Visuospatial Skills: WNL, and Clock Drawing: WNL  PATIENT REPORTED OUTCOME MEASURES (PROM): Cognitive function: Short Form: 40/40 - [name] rated 5 or "never" and  "none" to cognitive impairments     PATIENT EDUCATION: Education details: No ST recommended Person educated: Patient Education method: Explanation Education comprehension: verbalized understanding    ASSESSMENT:  CLINICAL IMPRESSION: Patient is a 42 y.o. female who was seen today for cognition. She was seen by ST on CIR. CIR ST did not recommend f/u with outpt ST. Lisa Werner denies any cognitive changes. She is managing meds, finances, household tasks as she is physically able to do. She is doing light cooking, working her phone, microwave, remote successfully. Denies difficulty with focus and concentration. Speech is 100% intelligible. She is managing her daily schedule and appointments with mod I. Mild executive function impairment on CLQT (score 23, 24 is WNL) with some noted impulsivity. Skilled ST is not recommended at  this time.   OBJECTIVE IMPAIRMENTS: include executive functioning. These impairments are limiting  patient from  N/A . Factors affecting potential to achieve goals and functional outcome are  N/A .Marland Kitchen Patient will benefit from skilled SLP services to address above impairments and improve overall function.  REHAB POTENTIAL: Good    [name], Radene Journey, CCC-SLP 02/04/2023, 3:10 PM

## 2023-02-04 NOTE — Therapy (Signed)
OUTPATIENT PHYSICAL THERAPY NEURO TREATMENT   Patient Name: Lisa Werner MRN: [id number] DOB:12/18/1980, 42 y.o., female Today's Date: 02/04/2023   PCP: Zoe Lan, MD REFERRING PROVIDER: Jacquelynn Cree, [name]  END OF SESSION:  PT End of Session - 02/04/23 1455     Visit Number 3    Number of Visits 13    Date for PT Re-Evaluation 03/25/23    Authorization Type [employer]    PT Start Time [phone]    PT Stop Time 1532    PT Time Calculation (min) 39 min    Equipment Utilized During Treatment Gait belt    Activity Tolerance Patient tolerated treatment well    Behavior During Therapy WFL for tasks assessed/performed             Past Medical History:  Diagnosis Date   Diabetes mellitus without complication (HCC)    Hyperlipidemia    Hypertension    History reviewed. No pertinent surgical history. Patient Active Problem List   Diagnosis Date Noted   LADA (latent autoimmune diabetes in adults), managed as type 1 (HCC) 12/24/2022   Left pontine stroke (HCC) 12/24/2022   CVA (cerebral vascular accident) (HCC) 12/20/2022   Acute ischemic stroke (HCC) 12/19/2022   Leukocytosis 12/19/2022   Acute right-sided weakness 12/19/2022   Essential hypertension 02/21/2019   Noncompliance with diabetes treatment 07/17/2017   Mixed hyperlipidemia 06/03/2017   Pain of left eye 06/03/2017   Urinary frequency 06/03/2017   DM2 (diabetes mellitus, type 2) (HCC) 09/28/2013    ONSET DATE: 01/09/2023 (referral date)  REFERRING DIAG: I63.9 (ICD-10-CM) - Cerebral infarction, unspecified  THERAPY DIAG:  Other abnormalities of gait and mobility  Unsteadiness on feet  Muscle weakness (generalized)  Rationale for Evaluation and Treatment: Rehabilitation  SUBJECTIVE:                                                                                                                                                                                             SUBJECTIVE  STATEMENT: Patient arrives to session and states sugars were 170 this morning. She has left monitor at home to scan for sugars and did not check sugar levels after last session. Denies falls/near falls since last here for PT.   Pt accompanied by: self  PERTINENT HISTORY: LAD/T IDDM, HTN, morbid obesity, acute infarct in left pons on 12/19/2022  PAIN:  Are you having pain? No  PRECAUTIONS: Monitor Sugar levels and fall  RED FLAGS: Bowel or bladder incontinence: Yes: reports constipation but is being medically managed    WEIGHT BEARING RESTRICTIONS: No  FALLS: Has patient fallen in last  6 months? Yes. 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  DIAGNOSTIC FINDINGS:   MR Brain wo contrast 12/19/2022 IMPRESSION: 1. Acute infarct in the left pons. 2. No intracranial large vessel occlusion or significant stenosis. 3. 1-2 mm laterally directed outpouching from the proximal right A2, which may represent a tiny aneurysm versus an infundibulum.    TODAY'S TREATMENT:                                                                                                                               VITALS: Vitals:   02/04/23 1503  BP: 130/88  Pulse: 94  Taken seated on LUE  TherAct: Discussed importance of carrying glucose monitor on her at all times as phone does not provide updates. Patient verbalized  understanding.   Rollator safety educations and use of breaks; patient practiced intermixed through session and verbalized teach back understanding   TherEx: SciFit twin peak setting on lvl 6 for improved cardiorespiratory conditioning, LE strengthening, and reciprocal UE/LE movement  Standing on foam with dead lift with CGA and tidal tank 2 x 10    Step up and back off the airex foam 1 x 15 with CGA-minA   PATIENT EDUCATION: Education details: Continue HEP Person educated: Patient Education method: Explanation Education comprehension: verbalized understanding and needs further education  HOME EXERCISE PROGRAM: Access Code: [email or web address] URL: [email or web address] Date: 02/01/2023 Prepared by: Maryruth Eve  Exercises - Staggered Sit-to-Stand  - 1 x daily - 7 x weekly - 2 sets - 10 reps - Semi-Tandem Corner Balance With Eyes Closed  - 1 x daily - 7 x weekly - 3 sets - 30 seconds hold  GOALS: Goals reviewed with patient? Yes  SHORT TERM GOALS: Target date: 02/19/2023  Patient will demonstrate independence with initial HEP to continue to progress between physical therapy sessions.   Baseline: To be prov Goal status: INITIAL  2.  Patient will improve gait speed to 0.74 m/s with LRAD to indicate improvement towards the level of community ambulator in order to participate more easily in activities outside of the home.   Baseline: 0.64 m/s with rollator Goal status: INITIAL  3.  to be evaluated and LTG written as indicated Baseline: 938 feet with rollator RPE 7/10 Goal status: MET   LONG TERM GOALS: Target date: 03/25/2023  Patient will demonstrate independence with initial HEP  to continue to progress between physical therapy sessions.   Baseline: To be prov Goal status: INITIAL  2.  Patient will improve gait speed to 0.84 m/s with LRAD to indicate improvement to the level of community ambulator in order to participate more easily in activities outside of  the home.   Baseline: 0.64 m/s with rollator Goal status: INITIAL  3.  Patient will improve to 1030 feet with LRAD to demonstrate improved aerobic capacity and endurance in order to participate more easily in daily walking tasks.   Baseline: 938 feet with rollator RPE 7/10 Goal status: MET  4. Patient will improve their 5x Sit to Stand score to less than 12 seconds to demonstrate a decreased risk for falls and improved LE strength.   Baseline: 15.15 seconds with UE use Goal status: INITIAL  5.  Patient will improve TUG score to 15 seconds with LRAD to indicate a decreased risk of falls and demonstrate improved overall mobility.   Baseline: 18.25 seconds with rollator (SBA) Goal status: INITIAL  ASSESSMENT:  CLINICAL IMPRESSION: Skilled physical therapy session emphasized education on glucose and AD safety in addition to progression of strengthening and cardiorespiratory endurance. Patient required CGA-minA due to difficulty stabilizing on compliant surface and picking up LE. Demonstrated appropriate understanding of safety with rollator by end of session but recommend reassessing next session to ensure carryover. Continue POC.   OBJECTIVE IMPAIRMENTS: Abnormal gait, decreased balance, decreased endurance, decreased mobility, difficulty walking, decreased ROM, decreased strength, and pain.   ACTIVITY LIMITATIONS: carrying, bending, standing, transfers, and locomotion level  PARTICIPATION LIMITATIONS: meal prep, cleaning, medication management, community activity, and yard work  PERSONAL FACTORS: Time since onset of injury/illness/exacerbation and 3+ comorbidities: see above  are also affecting patient's functional outcome.   REHAB POTENTIAL: Fair will require proper management of blood sugar in order to progress  CLINICAL DECISION MAKING: Evolving/moderate complexity  EVALUATION COMPLEXITY: Moderate  PLAN:  PT FREQUENCY: 2x/week  PT DURATION: 6 weeks  PLANNED  INTERVENTIONS: Therapeutic exercises, Therapeutic activity, Neuromuscular re-education, Balance training, Gait training, Patient/Family education, Self Care, Aquatic Therapy, Manual therapy, and Re-evaluation  PLAN FOR NEXT SESSION:  add to HEP with emphasis on balance and NMR for R side, training with LRAD, high level strength and balance work, review safety with transfers with rollator and fix rollator breaks if possible?, monitor blood sugar, work on steps up and balance on compliant surface, resistive gait  Carmelia Bake, PT, DPT 02/04/2023, 4:19 PM

## 2023-02-11 ENCOUNTER — Ambulatory Visit: Payer: BC Managed Care – PPO | Admitting: Physical Therapy

## 2023-02-11 ENCOUNTER — Encounter: Payer: Self-pay | Admitting: Physical Therapy

## 2023-02-11 VITALS — BP 110/75 | HR 106

## 2023-02-11 DIAGNOSIS — M6281 Muscle weakness (generalized): Secondary | ICD-10-CM

## 2023-02-11 DIAGNOSIS — R2689 Other abnormalities of gait and mobility: Secondary | ICD-10-CM

## 2023-02-11 DIAGNOSIS — R2681 Unsteadiness on feet: Secondary | ICD-10-CM

## 2023-02-11 NOTE — Therapy (Signed)
OUTPATIENT PHYSICAL THERAPY NEURO TREATMENT   Patient Name: Lisa Werner MRN: [id number] DOB:1980-12-17, 42 y.o., female Today's Date: 02/11/2023   PCP: Zoe Lan, MD REFERRING PROVIDER: Jacquelynn Cree, [name]  END OF SESSION:  PT End of Session - 02/11/23 1322     Visit Number 4    Number of Visits 13    Date for PT Re-Evaluation 03/25/23    Authorization Type Blue Cross Blue [employer]    PT Start Time 1320    PT Stop Time 1400    PT Time Calculation (min) 40 min    Equipment Utilized During Treatment Gait belt    Activity Tolerance Patient tolerated treatment well    Behavior During Therapy WFL for tasks assessed/performed;Flat affect             Past Medical History:  Diagnosis Date   Diabetes mellitus without complication (HCC)    Hyperlipidemia    Hypertension    History reviewed. No pertinent surgical history. Patient Active Problem List   Diagnosis Date Noted   LADA (latent autoimmune diabetes in adults), managed as type 1 (HCC) 12/24/2022   Left pontine stroke (HCC) 12/24/2022   CVA (cerebral vascular accident) (HCC) 12/20/2022   Acute ischemic stroke (HCC) 12/19/2022   Leukocytosis 12/19/2022   Acute right-sided weakness 12/19/2022   Essential hypertension 02/21/2019   Noncompliance with diabetes treatment 07/17/2017   Mixed hyperlipidemia 06/03/2017   Pain of left eye 06/03/2017   Urinary frequency 06/03/2017   DM2 (diabetes mellitus, type 2) (HCC) 09/28/2013    ONSET DATE: 01/09/2023 (referral date)  REFERRING DIAG: I63.9 (ICD-10-CM) - Cerebral infarction, unspecified  THERAPY DIAG:  Other abnormalities of gait and mobility  Unsteadiness on feet  Muscle weakness (generalized)  Rationale for Evaluation and Treatment: Rehabilitation  SUBJECTIVE:                                                                                                                                                                                              SUBJECTIVE STATEMENT: Patient arrives to session with glucose monitor. Current reading is 178. Patient has been closing monitoring at this time. Patient reports that overall her fatigue has been about the same and she still continues to be tired. Arrives to session with rollator. Denies falls/near falls.   Pt accompanied by: self  PERTINENT HISTORY: LAD/T IDDM, HTN, morbid obesity, acute infarct in left pons on 12/19/2022  PAIN:  Are you having pain? No  PRECAUTIONS: Monitor Sugar levels and fall  RED FLAGS: Bowel or bladder incontinence: Yes: reports constipation but is being medically managed    WEIGHT BEARING RESTRICTIONS:  No  FALLS: Has patient fallen in last 6 months? Yes. 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  DIAGNOSTIC FINDINGS:   MR Brain wo contrast 12/19/2022 IMPRESSION: 1. Acute infarct in the left pons. 2. No intracranial large vessel occlusion or significant stenosis. 3. 1-2 mm laterally directed outpouching from the proximal right A2, which may represent a tiny aneurysm versus an infundibulum.    TODAY'S TREATMENT:                                                                                                                               VITALS:  Vitals:   02/11/23 1327  BP: 110/75  Pulse: (!) 106  Taken seated on LUE  TherEx: SciFit sprint setting on lvl 5.5 for improved  cardiorespiratory conditioning, LE strengthening, and reciprocal UE/LE movement, full out effort on peak setting x 8 min   NMR: Med ball slams with walking 6 x 10 feet with 8lb med ball (CGA) 6" hurdle step overs (CGA) 1 x 4 hurdles stepping leading with RLE 1 x 4 hurdles stepping leading with LLE 4 x 4 hurdles emphasis on slow pacing, control, and alternating, mix of step through and step to  GAIT: Gait pattern: decreased stance time- Right, trendelenburg, and poor foot clearance- Right Distance walked: 1 x 40 feet Assistive device utilized: R [place] posterior strut AFO Level of assistance: SBA Comments: increases trendelberg and compensation, not recommended  GAIT: Gait pattern: decreased stance time- Right, trendelenburg, and poor foot clearance- Right Distance walked: 1 x 40 feet Assistive device utilized: R [place] posterior strut AFO Level of assistance: SBA Comments: increases trendelberg and compensation, not recommended   PATIENT EDUCATION: Education details: Continue HEP + POC moving forward Person educated: Patient Education method: Explanation Education comprehension: verbalized understanding and needs further education  HOME EXERCISE PROGRAM: Access Code: [email or web address] URL: [email or web address] Date: 02/01/2023 Prepared by: Maryruth Eve  Exercises - Staggered Sit-to-Stand  - 1 x daily - 7 x weekly - 2 sets - 10 reps - Semi-Tandem Corner Balance With Eyes Closed  - 1 x daily - 7 x weekly - 3 sets - 30 seconds hold  GOALS: Goals reviewed with patient? Yes  SHORT TERM GOALS: Target date: 02/19/2023  Patient will demonstrate independence with initial HEP to continue to progress between physical therapy sessions.   Baseline: To be prov Goal status:  INITIAL  2.  Patient will improve gait speed to 0.74 m/s with LRAD to indicate improvement towards the level of community ambulator in order to participate more easily in activities outside of the home.    Baseline: 0.64 m/s with rollator Goal status: INITIAL  3.  to be evaluated and LTG written as indicated Baseline: 938 feet with rollator RPE 7/10 Goal status: MET   LONG TERM GOALS: Target date: 03/25/2023  Patient will demonstrate independence with initial HEP to continue to progress between physical therapy sessions.   Baseline: To be prov Goal status: INITIAL  2.  Patient will improve gait speed to 0.84 m/s with LRAD to indicate improvement to the level of community ambulator in order to participate more easily in activities outside of the home.   Baseline: 0.64 m/s with rollator Goal status: INITIAL  3.  Patient will improve to 1030 feet with LRAD to demonstrate improved aerobic capacity and endurance in order to participate more easily in daily walking tasks.   Baseline: 938 feet with rollator RPE 7/10 Goal status: MET  4. Patient will improve their 5x Sit to Stand score to less than 12 seconds to demonstrate a decreased risk for falls and improved LE strength.   Baseline: 15.15 seconds with UE use Goal status: INITIAL  5.  Patient will improve TUG score to 15 seconds with LRAD to indicate a decreased risk of falls and demonstrate improved overall mobility.   Baseline: 18.25 seconds with rollator (SBA) Goal status: INITIAL  ASSESSMENT:  CLINICAL IMPRESSION: Patient continues to demonstrate improved independence managing blood sugar levels. Fatigue and ambualtion without AD demonstrate ongoing greatest challenges. Skilled session empahsized trial of AFO; patient demonstrated minimal improvement with trial and would not recommend pursuing further at this time. Also worked on modified SLS stance tasks with step overs, greater difficulty noted with stance on RLE. Plan to continue to progress more in future sessions. Continue POC.   OBJECTIVE IMPAIRMENTS: Abnormal gait, decreased balance, decreased endurance, decreased mobility, difficulty walking, decreased ROM,  decreased strength, and pain.   ACTIVITY LIMITATIONS: carrying, bending, standing, transfers, and locomotion level  PARTICIPATION LIMITATIONS: meal prep, cleaning, medication management, community activity, and yard work  PERSONAL FACTORS: Time since onset of injury/illness/exacerbation and 3+ comorbidities: see above  are also affecting patient's functional outcome.   REHAB POTENTIAL: Fair will require proper management of blood sugar in order to progress  CLINICAL DECISION MAKING: Evolving/moderate complexity  EVALUATION COMPLEXITY: Moderate  PLAN:  PT FREQUENCY: 2x/week  PT DURATION: 6 weeks  PLANNED INTERVENTIONS: Therapeutic exercises, Therapeutic activity, Neuromuscular re-education, Balance training, Gait training, Patient/Family education, Self Care, Aquatic Therapy, Manual therapy, and Re-evaluation  PLAN FOR NEXT SESSION:  add to HEP with emphasis on balance and NMR for R side, training with LRAD (consider trialing various canes),  high level strength and balance work, monitor blood sugar, work on steps up and balance on compliant surface particularly with dynamic tasks, resistive gait  Carmelia Bake, PT, DPT 02/11/2023, 5:13 PM

## 2023-02-15 ENCOUNTER — Ambulatory Visit: Payer: BC Managed Care – PPO | Admitting: Physical Therapy

## 2023-02-15 VITALS — BP 121/88 | HR 97

## 2023-02-15 DIAGNOSIS — R2681 Unsteadiness on feet: Secondary | ICD-10-CM

## 2023-02-15 DIAGNOSIS — R2689 Other abnormalities of gait and mobility: Secondary | ICD-10-CM | POA: Diagnosis not present

## 2023-02-15 DIAGNOSIS — R278 Other lack of coordination: Secondary | ICD-10-CM

## 2023-02-15 DIAGNOSIS — M6281 Muscle weakness (generalized): Secondary | ICD-10-CM

## 2023-02-15 NOTE — Therapy (Signed)
OUTPATIENT PHYSICAL THERAPY NEURO TREATMENT   Patient Name: Lisa Werner MRN: [id number] DOB:05-Jun-1980, 42 y.o., female Today's Date: 02/15/2023   PCP: Zoe Lan, MD REFERRING PROVIDER: Jacquelynn Cree, [name]  END OF SESSION:  PT End of Session - 02/15/23 0945     Visit Number 5    Number of Visits 13    Date for PT Re-Evaluation 03/25/23    Authorization Type Blue [employer] Blue [employer]    PT Start Time [phone]    PT Stop Time 1030    PT Time Calculation (min) 45 min    Equipment Utilized During Treatment Gait belt    Activity Tolerance Patient tolerated treatment well    Behavior During Therapy WFL for tasks assessed/performed;Flat affect              Past Medical History:  Diagnosis Date   Diabetes mellitus without complication (HCC)    Hyperlipidemia    Hypertension    No past surgical history on file. Patient Active Problem List   Diagnosis Date Noted   LADA (latent autoimmune diabetes in adults), managed as type 1 (HCC) 12/24/2022   Left pontine stroke (HCC) 12/24/2022   CVA (cerebral vascular accident) (HCC) 12/20/2022   Acute ischemic stroke (HCC) 12/19/2022   Leukocytosis 12/19/2022   Acute right-sided weakness 12/19/2022   Essential hypertension 02/21/2019   Noncompliance with diabetes treatment 07/17/2017   Mixed hyperlipidemia 06/03/2017   Pain of left eye 06/03/2017   Urinary frequency 06/03/2017   DM2 (diabetes mellitus, type 2) (HCC) 09/28/2013    ONSET DATE: 01/09/2023 (referral date)  REFERRING DIAG: I63.9 (ICD-10-CM) - Cerebral infarction, unspecified  THERAPY DIAG:  Other abnormalities of gait and mobility  Unsteadiness on feet  Muscle weakness (generalized)  Other lack of coordination  Rationale for Evaluation and Treatment: Rehabilitation  SUBJECTIVE:                                                                                                                                                                                              SUBJECTIVE STATEMENT:  Blood sugar is ~120 this morning per pt report. Denies falls or any other acute changes. Reports feeling good.  Ambulates in w/ rollator.    Pt accompanied by: self  PERTINENT HISTORY: LAD/T IDDM, HTN, morbid obesity, acute infarct in left pons on 12/19/2022  PAIN:  Are you having pain? No   PRECAUTIONS: Monitor Sugar levels and fall  RED FLAGS: Bowel or bladder incontinence: Yes: reports constipation but is being medically managed    WEIGHT BEARING RESTRICTIONS: No  FALLS: Has patient fallen in last 6 months? Yes. 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  DIAGNOSTIC FINDINGS:   MR Brain wo contrast 12/19/2022 IMPRESSION: 1. Acute infarct in the left pons. 2. No intracranial large vessel occlusion or significant stenosis. 3. 1-2 mm laterally directed outpouching from the proximal right A2, which may represent a tiny aneurysm versus an infundibulum.    TODAY'S TREATMENT:                                                                                                                               VITALS:  Vitals:   02/15/23 0950  BP: 121/88  Pulse: 97   Taken seated on LUE  Gait Training Gait pattern: decreased stance time- Right and poor foot clearance- Right Distance walked: 230 feet Assistive device utilized: Quad cane small base Level of assistance:  CGA Comments: cued to use on L side, 1st lap using SBQC however felt natural to pt, 2nd lap w/ initial cues to use 3pt gait progressing to 2pt gait, quite unstable placements of SBQC  Gait pattern: decreased stance time- Right and poor foot clearance- Right Distance walked: 115 feet Assistive device utilized:  hurrycane Level of assistance: CGA Comments: pt reports feeling that the cane gives her "a little less support" compared to [facility], however visibly improved management of cane placement and steadiness   Cone Weaving w/ Hurrycane, CGA 2 laps x6 cones widely spaced, weaving to the left and right 1 lap x6 cones narrowly spaced, weaving to the left and right  Therapeutic Exercise 6" step in parallel bars w/ BUE support and CGA step up forwards and off backwards x10 1 set w/ each BLE leading; pt rates as easy difficulty add contralateral knee drive Z61 1 set w/ each BLE leading add 5# ankle weight to each leg and repeated above exercises; pt rates as medium difficulty, becomes visibly fatigued at completion of activity  Seated hamstring stretch x30 second hold bilaterally   PATIENT EDUCATION:  Education details: Continue HEP + POC moving forward, spasticity may be contributing to night cramping in [place], follow up w/ [facility] on constipation Person educated: Patient Education method: Explanation Education comprehension: verbalized understanding and needs further education  HOME EXERCISE PROGRAM:  Access Code: [email or web address] URL: [email or web address] Date: 02/01/2023 Prepared by: Maryruth Eve  Exercises - Staggered Sit-to-Stand  - 1 x daily - 7 x weekly - 2 sets - 10 reps -  Semi-Tandem Corner Balance With Eyes Closed  - 1 x daily - 7 x weekly - 3 sets - 30 seconds hold  GOALS: Goals reviewed with patient? Yes  SHORT TERM GOALS: Target date: 02/19/2023  Patient will demonstrate independence with initial HEP to continue to progress between physical therapy sessions.    Baseline: To be prov Goal status: INITIAL  2.  Patient will improve gait speed to 0.74 m/s with LRAD to indicate improvement towards the level of community ambulator in order to participate more easily in activities outside of the home.   Baseline: 0.64 m/s with rollator Goal status: INITIAL  3.  to be evaluated and LTG written as indicated Baseline: 938 feet with rollator RPE 7/10 Goal status: MET   LONG TERM GOALS: Target date: 03/25/2023  Patient will demonstrate independence with initial HEP to continue to progress between physical therapy sessions.   Baseline: To be prov Goal status: INITIAL  2.  Patient will improve gait speed to 0.84 m/s with LRAD to indicate improvement to the level of community ambulator in order to participate more easily in activities outside of the home.   Baseline: 0.64 m/s with rollator Goal status: INITIAL  3.  Patient will improve to 1030 feet with LRAD to demonstrate improved aerobic capacity and endurance in order to participate more easily in daily walking tasks.   Baseline: 938 feet with rollator RPE 7/10 Goal status: MET  4. Patient will improve their 5x Sit to Stand score to less than 12 seconds to demonstrate a decreased risk for falls and improved LE strength.   Baseline: 15.15 seconds with UE use Goal status: INITIAL  5.  Patient will improve TUG score to 15 seconds with LRAD to indicate a decreased risk of falls and demonstrate improved overall mobility.   Baseline: 18.25 seconds with rollator (SBA) Goal status: INITIAL  ASSESSMENT:  CLINICAL IMPRESSION:  Skilled PT session emphasis on trialing SBQC and hurrycane for ambulation and step-up BLE strengthening activity. Pt ambulation unsteady but improved with hurrycane and 2 pt gait pattern and CGA. Pt tolerated LE step up strengthening well, becoming visibly fatigued at completion of activity and requiring seated rest break. Continue POC.   OBJECTIVE IMPAIRMENTS: Abnormal  gait, decreased balance, decreased endurance, decreased mobility, difficulty walking, decreased ROM, decreased strength, and pain.   ACTIVITY LIMITATIONS: carrying, bending, standing, transfers, and locomotion level  PARTICIPATION LIMITATIONS: meal prep, cleaning, medication management, community activity, and yard work  PERSONAL FACTORS: Time since onset of injury/illness/exacerbation and 3+ comorbidities: see above  are also affecting patient's functional outcome.   REHAB POTENTIAL: Fair will require proper management of blood sugar in order to progress  CLINICAL DECISION MAKING: Evolving/moderate complexity  EVALUATION COMPLEXITY: Moderate  PLAN:  PT FREQUENCY: 2x/week  PT DURATION: 6 weeks  PLANNED INTERVENTIONS: Therapeutic exercises, Therapeutic activity, Neuromuscular re-education, Balance training, Gait training, Patient/Family education, Self Care, Aquatic Therapy, Manual therapy, and Re-evaluation  PLAN FOR NEXT SESSION:  add to HEP with emphasis on balance and NMR for R side, training with LRAD (consider trialing various canes),  high level strength and balance work, monitor blood sugar, work on steps up and balance on compliant surface particularly with dynamic tasks, resistive gait   Beverely Low, Student-PT  02/15/2023, 10:32 AM

## 2023-02-18 ENCOUNTER — Ambulatory Visit: Payer: BC Managed Care – PPO | Admitting: Occupational Therapy

## 2023-02-18 ENCOUNTER — Ambulatory Visit: Payer: BC Managed Care – PPO | Admitting: Physical Therapy

## 2023-02-18 ENCOUNTER — Encounter: Payer: Self-pay | Admitting: Occupational Therapy

## 2023-02-18 VITALS — BP 111/83 | HR 96

## 2023-02-18 DIAGNOSIS — R2689 Other abnormalities of gait and mobility: Secondary | ICD-10-CM | POA: Diagnosis not present

## 2023-02-18 DIAGNOSIS — M6281 Muscle weakness (generalized): Secondary | ICD-10-CM

## 2023-02-18 DIAGNOSIS — R278 Other lack of coordination: Secondary | ICD-10-CM

## 2023-02-18 DIAGNOSIS — R2681 Unsteadiness on feet: Secondary | ICD-10-CM

## 2023-02-18 DIAGNOSIS — I69318 Other symptoms and signs involving cognitive functions following cerebral infarction: Secondary | ICD-10-CM

## 2023-02-18 NOTE — Therapy (Addendum)
OUTPATIENT PHYSICAL THERAPY NEURO TREATMENT   Patient Name: Lisa Werner MRN: [id number] DOB:1981-03-20, 42 y.o., female Today's Date: 02/18/2023   PCP: Zoe Lan, MD REFERRING PROVIDER: Jacquelynn Cree, [name]  END OF SESSION:  PT End of Session - 02/18/23 1147     Visit Number 6    Number of Visits 13    Date for PT Re-Evaluation 03/25/23    Authorization Type Blue Cross Blue Shield    PT Start Time 1148    PT Stop Time 1234    PT Time Calculation (min) 46 min    Equipment Utilized During Treatment Gait belt    Activity Tolerance Patient tolerated treatment well    Behavior During Therapy WFL for tasks assessed/performed;Flat affect               Past Medical History:  Diagnosis Date   Diabetes mellitus without complication (HCC)    Hyperlipidemia    Hypertension    No past surgical history on file. Patient Active Problem List   Diagnosis Date Noted   LADA (latent autoimmune diabetes in adults), managed as type 1 (HCC) 12/24/2022   Left pontine stroke (HCC) 12/24/2022   CVA (cerebral vascular accident) (HCC) 12/20/2022   Acute ischemic stroke (HCC) 12/19/2022   Leukocytosis 12/19/2022   Acute right-sided weakness 12/19/2022   Essential hypertension 02/21/2019   Noncompliance with diabetes treatment 07/17/2017   Mixed hyperlipidemia 06/03/2017   Pain of left eye 06/03/2017   Urinary frequency 06/03/2017   DM2 (diabetes mellitus, type 2) (HCC) 09/28/2013    ONSET DATE: 01/09/2023 (referral date)  REFERRING DIAG: I63.9 (ICD-10-CM) - Cerebral infarction, unspecified  THERAPY DIAG:  Other lack of coordination  Unsteadiness on feet  Muscle weakness (generalized)  Other abnormalities of gait and mobility  Rationale for Evaluation and Treatment: Rehabilitation  SUBJECTIVE:                                                                                                                                                                                              SUBJECTIVE STATEMENT:  Per pt glucose is ~120. Reports cane at home is [facility]. Reports had a good weekend. Denies falls or acute changes. Felt tired after last session.   Current shoes are 42 years old. Pt reports walking 2 miles daily outdoors with dad.  Pt accompanied by: self  PERTINENT HISTORY: LAD/T IDDM, HTN, morbid obesity, acute infarct in left pons on 12/19/2022  PAIN:  Are you having pain? No   PRECAUTIONS: Monitor Sugar levels and fall  RED FLAGS: Bowel or bladder incontinence: Yes: reports constipation but is being medically managed  WEIGHT BEARING RESTRICTIONS: No  FALLS: Has patient fallen in last 6 months? Yes. 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  DIAGNOSTIC FINDINGS:   MR Brain wo contrast 12/19/2022 IMPRESSION: 1. Acute infarct in the left pons. 2. No intracranial large vessel occlusion or significant stenosis. 3. 1-2 mm laterally directed outpouching from the proximal right A2, which may represent a tiny aneurysm versus an infundibulum.    TODAY'S TREATMENT:                                                                                                                               VITALS:  Vitals:   02/18/23 1153  BP: 111/83  Pulse: 96  Taken seated on LUE  Gait Training Gait pattern: decreased stance time- Right and poor foot clearance-  Right Distance walked: 230 feet Assistive device utilized:  hurrycane Level of assistance: CGA Comments: pt reports feeling like her R foot is asleep, painful   Gait pattern: decreased stance time- Right and poor foot clearance- Right Distance walked: 230 feet Assistive device utilized: Single point cane Level of assistance: CGA Comments: "this cane is light"    Ramp w/ Hurrycane up and down x5 repetitions, pt rates as easy  Curb w/ Hurrycane up and down x5 repetitions, pt rates as medium Educated on up w/ strong and down w/ weak  NMR Forwards 4 x 6" hurdles on blue mat in parallel bars w/ BUE support and close SBA x6 laps Constant verbal cues for increased hip and knee flexion  Lateral 4 x 6" hurdles on blue mat in parallel bars w/ BUE support and close SBA x3 laps each LE leading Multiple standing rest breaks 2/2 fatigue Pt rates as hard   : completed 950 ft w/ SPC and CGA, 7/10 RPE Pt initially ambulating quickly, slows around 2 minutes    PATIENT EDUCATION:  Education details: Continue HEP, do not use SPC at home yet- will continue practicing at therapy, outcome measure finding  Person educated: Patient Education method: Explanation Education comprehension: verbalized understanding and needs further education  HOME EXERCISE PROGRAM:  Access Code: [email or web address] URL: [email or web address] Date: 02/01/2023 Prepared by: Maryruth Eve  Exercises - Staggered Sit-to-Stand  - 1 x daily - 7 x weekly - 2 sets - 10 reps - Semi-Tandem Corner Balance With Eyes Closed  - 1 x daily - 7 x  weekly - 3 sets - 30 seconds hold  GOALS: Goals reviewed with patient? Yes  SHORT TERM GOALS: Target date: 02/19/2023   Patient will demonstrate independence with initial HEP to continue to progress between physical therapy sessions.   Baseline: To be prov Goal status: INITIAL  2.  Patient will improve gait speed to 0.74 m/s with LRAD to indicate improvement towards the level  of community ambulator in order to participate more easily in activities outside of the home.   Baseline: 0.64 m/s with rollator Goal status: INITIAL  3.  to be evaluated and LTG written as indicated Baseline: 938 feet with rollator RPE 7/10 Goal status: MET   LONG TERM GOALS: Target date: 03/25/2023  Patient will demonstrate independence with initial HEP to continue to progress between physical therapy sessions.   Baseline: To be prov Goal status: INITIAL  2.  Patient will improve gait speed to 0.84 m/s with LRAD to indicate improvement to the level of community ambulator in order to participate more easily in activities outside of the home.   Baseline: 0.64 m/s with rollator Goal status: INITIAL  3.  Patient will improve to 1030 feet with LRAD to demonstrate improved aerobic capacity and endurance in order to participate more easily in daily walking tasks.   Baseline: 938 feet with rollator RPE 7/10, 950 ft w/ SPC RPE 7/10 Goal status: IN PROGRESS  4. Patient will improve their 5x Sit to Stand score to less than 12 seconds to demonstrate a decreased risk for falls and improved LE strength.   Baseline: 15.15 seconds with UE use Goal status: INITIAL  5.  Patient will improve TUG score to 15 seconds with LRAD to indicate a decreased risk of falls and demonstrate improved overall mobility.   Baseline: 18.25 seconds with rollator (SBA) Goal status: INITIAL  ASSESSMENT:  CLINICAL IMPRESSION:  Skilled PT session emphasis on ambulation with hurrycane and SPC, SLS on uneven surfaces, and assessing w/ SPC. Pt demonstrates improvement in distance with SPC compared to rollator this date. Pt fatigued w/ hurdles activity in parallel bars especially w/ lateral stepping, requiring multiple standing rest breaks and a seated rest break at the completion. Continue POC.   OBJECTIVE IMPAIRMENTS: Abnormal gait, decreased balance, decreased endurance, decreased mobility, difficulty  walking, decreased ROM, decreased strength, and pain.   ACTIVITY LIMITATIONS: carrying, bending, standing, transfers, and locomotion level  PARTICIPATION LIMITATIONS: meal prep, cleaning, medication management, community activity, and yard work  PERSONAL FACTORS: Time since onset of injury/illness/exacerbation and 3+ comorbidities: see above  are also affecting patient's functional outcome.   REHAB POTENTIAL: Fair will require proper management of blood sugar in order to progress  CLINICAL DECISION MAKING: Evolving/moderate complexity  EVALUATION COMPLEXITY: Moderate  PLAN:  PT FREQUENCY: 2x/week  PT DURATION: 6 weeks  PLANNED INTERVENTIONS: Therapeutic exercises, Therapeutic activity, Neuromuscular re-education, Balance training, Gait training, Patient/Family education, Self Care, Aquatic Therapy, Manual therapy, and Re-evaluation  PLAN FOR NEXT SESSION:  add to HEP with emphasis on balance and NMR for R side, training with SPC (pt owns),  high level strength and balance work, monitor blood sugar, work on steps up and balance on compliant surface particularly with dynamic tasks, resistive gait, STGs   Beverely Low, Student-PT  02/18/2023, 12:35 PM

## 2023-02-18 NOTE — Patient Instructions (Signed)
  Coordination Activities  Perform the following activities for 15 minutes 1-2 times per day with right hand(s).  Rotate ball in fingertips (clockwise and counter-clockwise). Flip cards 1 at a time as fast as you can. Deal cards with your thumb (Hold deck in hand and push card off top with thumb). Rotate card in hand (clockwise and counter-clockwise). Pick up coins one at a time until you get 5 in your hand, then move coins from palm to fingertips to stack one at a time. Practice writing and typing.   1. Grip Strengthening (Resistive Putty)   Squeeze putty using thumb and all fingers. Repeat _20___ times. Do __2__ sessions per day.   2. Roll putty into tube on table and pinch between first two fingers and thumb x 10 reps. Do 2 sessions per day

## 2023-02-18 NOTE — Therapy (Signed)
OUTPATIENT OCCUPATIONAL THERAPY NEURO TREATMENT  Patient Name: Lisa Werner MRN: [id number] DOB:06/04/1980, 42 y.o., female Today's Date: 02/18/2023  PCP: Dr. Julieanne Manson REFERRING PROVIDER: Delle Reining, [name]  END OF SESSION:  OT End of Session - 02/18/23 1103     Visit Number 2    Number of Visits 17    Date for OT Re-Evaluation 03/29/23    Authorization Type [employer] covered 100%, no visit limit, no auth    OT Start Time 1103    OT Stop Time 1145    OT Time Calculation (min) 42 min    Activity Tolerance Patient tolerated treatment well    Behavior During Therapy WFL for tasks assessed/performed;Flat affect             Past Medical History:  Diagnosis Date   Diabetes mellitus without complication (HCC)    Hyperlipidemia    Hypertension    History reviewed. No pertinent surgical history. Patient Active Problem List   Diagnosis Date Noted   LADA (latent autoimmune diabetes in adults), managed as type 1 (HCC) 12/24/2022   Left pontine stroke (HCC) 12/24/2022   CVA (cerebral vascular accident) (HCC) 12/20/2022   Acute ischemic stroke (HCC) 12/19/2022   Leukocytosis 12/19/2022   Acute right-sided weakness 12/19/2022   Essential hypertension 02/21/2019   Noncompliance with diabetes treatment 07/17/2017   Mixed hyperlipidemia 06/03/2017   Pain of left eye 06/03/2017   Urinary frequency 06/03/2017   DM2 (diabetes mellitus, type 2) (HCC) 09/28/2013    ONSET DATE: 12/19/22 hospitalized (weakness x4 days prior)  REFERRING DIAG: I69.30 (ICD-10-CM) - Unspecified sequelae of cerebral infarction  THERAPY DIAG:  Other lack of coordination  Unsteadiness on feet  Muscle weakness (generalized)  Other symptoms and signs involving cognitive functions following cerebral infarction  Rationale for Evaluation and Treatment: Rehabilitation  SUBJECTIVE:   SUBJECTIVE STATEMENT: I have cramps at night in my hands and feet. No recent falls  Pt accompanied by:  self  PERTINENT HISTORY: Left pontine stroke with R-sided weakness and cognitive deficits.  Hospitalized 12/19/22-01/09/23.  PMH:    Mixed hyperlipidemia,  Essential hypertension, LADA (latent autoimmune diabetes in adults), managed as type 1 (HCC), Persistent leucocytosis ,Hyponatremia   PRECAUTIONS: Fall and Other: no driving  WEIGHT BEARING RESTRICTIONS: No  PAIN:  Are you having pain? No  FALLS: Has patient fallen in last 6 months? Yes. Number of falls 2-3 falls before she knew she had the stroke  LIVING ENVIRONMENT: Lives with:  staying with father currently, but lived alone with 16 y.o. son prior   Lives in: Other condo Stairs: Yes: External: 3 steps; can reach both Has following equipment at home: Environmental consultant - 4 wheeled and shower chair  PLOF: Independent, Vocation/Vocational requirements: middle Estate agent, and Leisure: enjoys drawing, teaching   PATIENT GOALS: to get back to "my regular self"  OBJECTIVE:   HAND DOMINANCE: Right  ADLs:  Transfers/ambulation related to ADLs:  mod I Eating: some drops, can open water bottle now Grooming: mod I UB Dressing: mod I, sometimes shirt gets tangled  LB Dressing: difficulty with pants, unsure about tying  Toileting: mod I, but difficulty with hygiene with R hand  Bathing: mod I Tub Shower transfers: mod I Equipment: Shower seat with back  IADLs: Shopping: needs transportation, mod I in store Light housekeeping: pt folding clothes, but family assisting Meal Prep: family performing Community mobility: unable to drive currently Medication management: father sets up meds, pt doing finger sticks and insulin Financial management: mod I  per pt  Handwriting:  difficulty, incr time   MOBILITY STATUS: Hx of falls and rollator for longer distances, holding onto wall right now in the home  POSTURE COMMENTS:  rounded shoulders and weight shift right Sitting balance:  sits leaning to R side  ACTIVITY  TOLERANCE: Activity tolerance: fatigues quickly  FUNCTIONAL OUTCOME MEASURES: FOTO: Not captured today due to difficulty logging on  UPPER EXTREMITY ROM:  BUEs grossly WNL except RUE mild decr wrist ext, finger ext, and supination   UPPER EXTREMITY MMT:     MMT Right eval Left eval  Shoulder flexion 4-/5 5/5  Shoulder abduction 4-/5 5/5  Shoulder adduction 4-/5 5/5  Shoulder extension    Shoulder internal rotation    Shoulder external rotation    Middle trapezius    Lower trapezius    Elbow flexion 4-/5 5/5  Elbow extension 4-/5 5/5  Wrist flexion    Wrist extension    Wrist ulnar deviation    Wrist radial deviation    Wrist pronation    Wrist supination    (Blank rows = not tested)  HAND FUNCTION: Grip strength: Right: 24.6 lbs; Left: 34.3 lbs 02/18/23: RT = 31.3 LBS  COORDINATION: 9 Hole Peg test: Right: 34.16 sec; Left: 26.50 sec 02/18/23: Rt = 30.61 sec  SENSATION: Pt reports mild numbness R hand which incr at time  EDEMA: none noted  MUSCLE TONE: BUEs WNL  COGNITION: Overall cognitive status:  reports that it is harder to focus  VISION: Subjective report: Pt denies changes.   Baseline vision: Wears glasses for reading only    TODAY'S TREATMENT:                                                                                                                                Pt issued coordination and putty HEP - See pt instructions for details.   Practiced writing name with regular pen, built up pen, and Pen Again - pt did best with built up pen and provided tan foam for pen  Reinforced pt's risk factors of stroke (HTN, HLD, DM) and importance of taking meds and monitoring/controlling all 3 to reduce risk of future strokes  PATIENT EDUCATION: Education details: coordination and putty HEP  Person educated: Patient Education method: Programmer, multimedia, Demonstration, Verbal cues, and Handouts Education comprehension: verbalized understanding, returned  demonstration, tactile cues required, and needs further education  HOME EXERCISE PROGRAM: 02/18/23: Coordination and putty HEP    GOALS: Goals reviewed with patient? Yes  SHORT TERM GOALS: Target date: 02/27/23  Pt will be independent with initial HEP for RUE coordination and strength. Goal status: IN PROGRESS  2.  Pt will perform simple cooking tasks with supervision. Goal status: INITIAL  3.  Pt will perform simple home maintenance tasks mod I. Goal status: INITIAL  4.   Pt will improve R grip strength by at least 10lbs for opening containers. Baseline:  24.6lbs Goal status: IN PROGRESS (31  LBS)   5.  Pt will improve coordination for ADLs as shown by completing 9-hole peg test in 30sec or less with dominant RUE. Baseline:  34.16sec Goal status: MET (30 SEC)   6.  Pt will be able to perform simple drawing with AE/compensation strategies. Goal status: INITIAL   LONG TERM GOALS: Target date: 03/29/23  Pt will be independent with updated HEP for RUE coordination and strength. Baseline:  Goal status: INITIAL  2.  Pt will perform simple cooking tasks mod I. Baseline:  Goal status: INITIAL  3.  Pt will improve R grip strength by at least 10lbs for opening containers. Baseline:  24.6 Goal status: INITIAL  4.  Pt will improve coordination for ADLs as shown by completing 9-hole peg test in 30sec or less with dominant RUE. Baseline:  34.16sec Goal status: INITIAL  5.  Pt will complete mod complex home maintenance tasks mod I. Goal status: INITIAL  6.  Assess FOTO and establish goal. Baseline: not yet tested Goal status: INITIAL  ASSESSMENT:  CLINICAL IMPRESSION: Patient is a 42 y.o. female who was seen today for occupational therapy treatment for CVA.   PMH includes: Mixed hyperlipidemia,  Essential hypertension, LADA (latent autoimmune diabetes in adults), managed as type 1 (HCC), Persistent leucocytosis ,Hyponatremia.  Pt presents today with decr coordination,  decr strength, decr balance for ADLs, decr activity tolerance, and cognitive deficits.  Pt was independent prior to CVA and was working as a Clinical cytogeneticist.  Pt has a 2 y.o. son and was previously living alone.  Pt currently unable to work or drive and needs assistance for IADLs.  Pt has already improved in grip strength and coordination since initial evaluation and has met 1 STG. Pt would benefit from continued occupational therapy to address these deficits for improved dominant RUE functional use and incr independence with ADLs/IADLs to return to prior level of function.   PERFORMANCE DEFICITS: in functional skills including ADLs, IADLs, coordination, dexterity, sensation, ROM, strength, Fine motor control, balance, endurance, decreased knowledge of use of DME, and UE functional use, cognitive skills including attention, and psychosocial skills including routines and behaviors.   IMPAIRMENTS: are limiting patient from ADLs, IADLs, work, and leisure.   CO-MORBIDITIES: may have co-morbidities  that affects occupational performance. Patient will benefit from skilled OT to address above impairments and improve overall function.  MODIFICATION OR ASSISTANCE TO COMPLETE EVALUATION: Min-Moderate modification of tasks or assist with assess necessary to complete an evaluation.  OT OCCUPATIONAL PROFILE AND HISTORY: Detailed assessment: Review of records and additional review of physical, cognitive, psychosocial history related to current functional performance.  CLINICAL DECISION MAKING: Moderate - several treatment options, min-mod task modification necessary  REHAB POTENTIAL: Good  EVALUATION COMPLEXITY: Moderate    PLAN:  OT FREQUENCY: 2x/week  OT DURATION: 8 weeks  +evals   PLANNED INTERVENTIONS: self care/ADL training, therapeutic exercise, therapeutic activity, neuromuscular re-education, manual therapy, passive range of motion, balance training, functional mobility  training, moist heat, cryotherapy, patient/family education, cognitive remediation/compensation, energy conservation, and DME and/or AE instructions  RECOMMENDED OTHER SERVICES: none at this time  CONSULTED AND AGREED WITH PLAN OF CARE: Patient  PLAN FOR NEXT SESSION: review HEP, progress towards STG's   Sheran Lawless, OTR/L 02/18/2023, 11:04 AM

## 2023-02-22 ENCOUNTER — Encounter: Payer: Self-pay | Admitting: Physical Therapy

## 2023-02-22 ENCOUNTER — Ambulatory Visit: Payer: BC Managed Care – PPO | Admitting: Physical Therapy

## 2023-02-22 VITALS — BP 122/83 | HR 101

## 2023-02-22 DIAGNOSIS — M6281 Muscle weakness (generalized): Secondary | ICD-10-CM

## 2023-02-22 DIAGNOSIS — R2681 Unsteadiness on feet: Secondary | ICD-10-CM

## 2023-02-22 DIAGNOSIS — R2689 Other abnormalities of gait and mobility: Secondary | ICD-10-CM | POA: Diagnosis not present

## 2023-02-22 NOTE — Therapy (Unsigned)
OUTPATIENT PHYSICAL THERAPY NEURO TREATMENT   Patient Name: Markia Occhipinti MRN: [id number] DOB:11-29-1980, 42 y.o., female Today's Date: 02/25/2023   PCP: Zoe Lan, MD REFERRING PROVIDER: Jacquelynn Cree, [name]  END OF SESSION:    02/22/23 1021  PT Visits / Re-Eval  Visit Number 7  Number of Visits 13  Date for PT Re-Evaluation 03/25/23  Authorization  Authorization Type [employer]  PT Time Calculation  PT Start Time 1019  PT Stop Time 1100  PT Time Calculation (min) 41 min  PT - End of Session  Equipment Utilized During Treatment Gait belt  Activity Tolerance Patient tolerated treatment well  Behavior During Therapy WFL for tasks assessed/performed;Flat affect      Past Medical History:  Diagnosis Date   Diabetes mellitus without complication (HCC)    Hyperlipidemia    Hypertension    History reviewed. No pertinent surgical history. Patient Active Problem List   Diagnosis Date Noted   LADA (latent autoimmune diabetes in adults), managed as type 1 (HCC) 12/24/2022   Left pontine stroke (HCC) 12/24/2022   CVA (cerebral vascular accident) (HCC) 12/20/2022   Acute ischemic stroke (HCC) 12/19/2022   Leukocytosis 12/19/2022   Acute right-sided weakness 12/19/2022   Essential hypertension 02/21/2019   Noncompliance with diabetes treatment 07/17/2017   Mixed hyperlipidemia 06/03/2017   Pain of left eye 06/03/2017   Urinary frequency 06/03/2017   DM2 (diabetes mellitus, type 2) (HCC) 09/28/2013    ONSET DATE: 01/09/2023 (referral date)  REFERRING DIAG: I63.9 (ICD-10-CM) - Cerebral infarction, unspecified  THERAPY DIAG:  Unsteadiness on feet  Muscle weakness (generalized)  Other abnormalities of gait and mobility  Rationale for Evaluation and Treatment: Rehabilitation  SUBJECTIVE:                                                                                                                                                                                              SUBJECTIVE STATEMENT:  Patient reports that she is doing well. She states she feels like her endurance is improving. Patient reports that she is now able to tolerate walking about 2 miles with only one break. Denies falls and near falls.   Sugar levels: 170  Pt accompanied by: self  PERTINENT HISTORY: LAD/T IDDM, HTN, morbid obesity, acute infarct in left pons on 12/19/2022  PAIN:  Are you having pain? No   PRECAUTIONS: Monitor Sugar levels and fall  RED FLAGS: Bowel or bladder incontinence: Yes: reports constipation but is being medically managed    WEIGHT BEARING RESTRICTIONS: No  FALLS: Has patient fallen in last 6 months? Yes. 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  DIAGNOSTIC FINDINGS:   MR Brain wo contrast 12/19/2022 IMPRESSION: 1. Acute infarct in the left pons. 2. No intracranial large vessel occlusion or significant stenosis. 3. 1-2 mm laterally directed outpouching from the proximal right A2, which may represent a tiny aneurysm versus an infundibulum.    TODAY'S TREATMENT:                                                                                                                               VITALS:  Vitals:   02/22/23 1027  BP: 122/83  Pulse: (!) 101   Taken seated on LUE  Gait Training  Gait pattern: decreased stance time- Right and poor  foot clearance- Right Distance walked: 800 feet outdoors Assistive device utilized: Single point cane Level of assistance: SBA Comments: outdoor ambulation up and down low grade ramps, appropriate pacing, completed 5x curbs  TherAct:   Floor Recovery: Patient educated in floor recovery this visit using teach-back for injury assessment and sequencing of task in clinic setting.  Discussion of transfer of skills to variable scenarios outside the clinic.  Patient has most difficulty with eccentric lower from tall kneel to supine and with push to stand requiring UE support.  Performed 1 times. Caregiver Training:  Caregiver not present..   Level of Assist:  Verbal/tactile cues.       02/22/23 0001  Standardized Balance Assessment  10 Meter Walk 0.74 (m/s with SPC (SBA), 0.62 m/s with rollator (modI))    PATIENT EDUCATION:  Education details: Continue HEP + SPC trial with supervision from family at home for shorter distances Person educated: Patient Education method: Explanation Education comprehension: verbalized understanding and needs further education  HOME EXERCISE PROGRAM:  Access Code: [email or web address] URL: [email or web address] Date: 02/01/2023 Prepared by: Maryruth Eve  Exercises - Staggered Sit-to-Stand  - 1 x daily - 7 x weekly - 2 sets - 10 reps - Semi-Tandem Corner Balance With Eyes Closed  - 1 x daily - 7 x weekly - 3 sets - 30 seconds hold  GOALS: Goals reviewed with patient? Yes  SHORT TERM GOALS: Target date: 02/19/2023   Patient will demonstrate independence with initial HEP to continue to progress between physical therapy sessions.   Baseline: Reports good understanding of exercises for home Goal status: MET  2.  Patient will improve gait speed to 0.74 m/s with LRAD  to indicate improvement towards the level of community ambulator in order to participate more easily in activities outside of the home.   Baseline: 0.64 m/s with rollator; 0.74 m/s with SPC  (SBA) and 0.62 m/s with rollator (modI) (10/25)  Goal status: MET  3.  to be evaluated and LTG written as indicated Baseline: 938 feet with rollator RPE 7/10 Goal status: MET   LONG TERM GOALS: Target date: 03/25/2023  Patient will demonstrate independence with initial HEP to continue to progress between physical therapy sessions.   Baseline: To be prov Goal status: INITIAL  2.  Patient will improve gait speed to 0.84 m/s with LRAD to indicate improvement to the level of community ambulator in order to participate more easily in activities outside of the home.   Baseline: 0.64 m/s with rollator Goal status: INITIAL  3.  Patient will improve to 1030 feet with LRAD to demonstrate improved aerobic capacity and endurance in order to participate more easily in daily walking tasks.   Baseline: 938 feet with rollator RPE 7/10, 950 ft w/ SPC RPE 7/10 Goal status: IN PROGRESS  4. Patient will improve their 5x Sit to Stand score to less than 12 seconds to demonstrate a decreased risk for falls and improved LE strength.   Baseline: 15.15 seconds with UE use Goal status: INITIAL  5.  Patient will improve TUG score to 15 seconds with LRAD to indicate a decreased risk of falls and demonstrate improved overall mobility.   Baseline: 18.25 seconds with rollator (SBA) Goal status: INITIAL  ASSESSMENT:  CLINICAL IMPRESSION:  Skilled PT session emphasized continued practice with SPC outdoors, work on fall recovery, and STG assessment. Patient achieved all STGs and is progressing excellently towards goals. Patient demonstrates ability to peform floor recovery and use of SPC with SBA. Recommend trial of SPC with supervison at home and for short distance with use of rollator as needed. Continue POC.   OBJECTIVE IMPAIRMENTS: Abnormal gait, decreased balance, decreased endurance, decreased mobility, difficulty walking, decreased ROM, decreased strength, and pain.   ACTIVITY LIMITATIONS:  carrying, bending, standing, transfers, and locomotion level  PARTICIPATION LIMITATIONS: meal prep, cleaning, medication management, community activity, and yard work  PERSONAL FACTORS: Time since onset of injury/illness/exacerbation and 3+ comorbidities: see above  are also affecting patient's functional outcome.   REHAB POTENTIAL: Fair will require proper management of blood sugar in order to progress  CLINICAL DECISION MAKING: Evolving/moderate complexity  EVALUATION COMPLEXITY: Moderate  PLAN:  PT FREQUENCY: 2x/week  PT DURATION: 6 weeks  PLANNED INTERVENTIONS: Therapeutic exercises, Therapeutic activity, Neuromuscular re-education, Balance training, Gait training, Patient/Family education, Self Care, Aquatic Therapy, Manual therapy, and Re-evaluation  PLAN FOR NEXT SESSION:  add to HEP with emphasis on balance and NMR for R side, training with SPC (pt owns),  high level strength and balance work, monitor blood sugar, work on steps up and balance on compliant surface particularly with dynamic tasks, resistive gait, work on [facility] over grass or with step overs etc   Carmelia Bake, PT, DPT  02/25/2023, 7:55 AM

## 2023-02-25 ENCOUNTER — Ambulatory Visit: Payer: BC Managed Care – PPO | Admitting: Physical Therapy

## 2023-02-25 ENCOUNTER — Encounter: Payer: BC Managed Care – PPO | Admitting: Speech Pathology

## 2023-02-25 ENCOUNTER — Ambulatory Visit: Payer: BC Managed Care – PPO | Admitting: Occupational Therapy

## 2023-02-25 ENCOUNTER — Encounter: Payer: Self-pay | Admitting: Physical Therapy

## 2023-02-25 DIAGNOSIS — R278 Other lack of coordination: Secondary | ICD-10-CM

## 2023-02-25 DIAGNOSIS — R41840 Attention and concentration deficit: Secondary | ICD-10-CM

## 2023-02-25 DIAGNOSIS — R208 Other disturbances of skin sensation: Secondary | ICD-10-CM

## 2023-02-25 DIAGNOSIS — R2689 Other abnormalities of gait and mobility: Secondary | ICD-10-CM | POA: Diagnosis not present

## 2023-02-25 DIAGNOSIS — R2681 Unsteadiness on feet: Secondary | ICD-10-CM

## 2023-02-25 DIAGNOSIS — M6281 Muscle weakness (generalized): Secondary | ICD-10-CM

## 2023-02-25 NOTE — Therapy (Unsigned)
OUTPATIENT OCCUPATIONAL THERAPY NEURO TREATMENT  Patient Name: Lisa Werner MRN: [id number] DOB:1980-06-25, 42 y.o., female Today's Date: 02/25/2023  PCP: Dr. Julieanne Manson REFERRING PROVIDER: Delle Reining, [name]  END OF SESSION:  OT End of Session - 02/25/23 1400     Visit Number 3    Number of Visits 17    Date for OT Re-Evaluation 03/29/23    Authorization Type [employer] covered 100%, no visit limit, no auth    OT Start Time 1401    OT Stop Time 1445    OT Time Calculation (min) 44 min    Equipment Utilized During [employer]    Activity Tolerance Patient tolerated treatment well    Behavior During Therapy WFL for tasks assessed/performed             Past Medical History:  Diagnosis Date   Diabetes mellitus without complication (HCC)    Hyperlipidemia    Hypertension    No past surgical history on file. Patient Active Problem List   Diagnosis Date Noted   LADA (latent autoimmune diabetes in adults), managed as type 1 (HCC) 12/24/2022   Left pontine stroke (HCC) 12/24/2022   CVA (cerebral vascular accident) (HCC) 12/20/2022   Acute ischemic stroke (HCC) 12/19/2022   Leukocytosis 12/19/2022   Acute right-sided weakness 12/19/2022   Essential hypertension 02/21/2019   Noncompliance with diabetes treatment 07/17/2017   Mixed hyperlipidemia 06/03/2017   Pain of left eye 06/03/2017   Urinary frequency 06/03/2017   DM2 (diabetes mellitus, type 2) (HCC) 09/28/2013    ONSET DATE: 12/19/22 hospitalized (weakness x4 days prior)  REFERRING DIAG: I69.30 (ICD-10-CM) - Unspecified sequelae of cerebral infarction  THERAPY DIAG:  Other lack of coordination  Muscle weakness (generalized)  Unsteadiness on feet  Attention and concentration deficit  Other disturbances of skin sensation  Rationale for Evaluation and Treatment: Rehabilitation  SUBJECTIVE:   SUBJECTIVE STATEMENT:  Pt arrived for PT eariler today but did not have insulin  check system in place and session was not completed.  Dexcom was applied and reading by time of OT session with pt reporting blood sugar of 170 at beginning of session.  Device was going off during session and pt reporting it was alerting her that her blood sugar was going down but was still "above 100".  At end of session, pt showed OT the device when it alarmed and her blood sugar was over 300.  She reported that she hd eaten [place] on the way to therapy today and was going home to get her medicine.  Pt reported that she has still been having having cramps but just at night when her arm stretches out.  She did see that the side effects of 2 of her medicines is cramps.  She plans to discuss this with her DR at her appt this week.   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  She plans to return home upon completion of her therapies but is not sure aout going back to work due to the stress of being a Runner, broadcasting/film/video.   Pt accompanied by: self  PERTINENT HISTORY: Left pontine stroke with R-sided weakness and cognitive deficits.  Hospitalized 12/19/22-01/09/23.  PMH:    Mixed hyperlipidemia,  Essential hypertension, LADA (latent autoimmune diabetes in adults), managed as type 1 (HCC), Persistent leucocytosis ,Hyponatremia   PRECAUTIONS: Fall and Other: no driving  WEIGHT BEARING RESTRICTIONS: No  PAIN:  Are you having pain? No  FALLS: Has patient fallen in last  6 months? Yes. Number of falls 2-3 falls before she knew she had the stroke  LIVING ENVIRONMENT: Lives with:  staying with father currently, but lived alone with 41 y.o. son prior   Lives in: Other condo Stairs: Yes: External: 3 steps; can reach both Has following equipment at home: Environmental consultant - 4 wheeled and shower chair  PLOF: Independent, Vocation/Vocational requirements: middle Estate agent, and Leisure: enjoys drawing, teaching   PATIENT GOALS: to get back to "my regular self"  OBJECTIVE:   HAND DOMINANCE:  Right  ADLs:  Transfers/ambulation related to ADLs:  mod I Eating: some drops, can open water bottle now Grooming: mod I UB Dressing: mod I, sometimes shirt gets tangled  LB Dressing: difficulty with pants, unsure about tying  Toileting: mod I, but difficulty with hygiene with R hand  Bathing: mod I Tub Shower transfers: mod I Equipment: Shower seat with back  IADLs: Shopping: needs transportation, mod I in store Light housekeeping: pt folding clothes, but family assisting Meal Prep: family performing Community mobility: unable to drive currently Medication management: father sets up meds, pt doing finger sticks and insulin Financial management: mod I per pt  Handwriting:  difficulty, incr time   MOBILITY STATUS: Hx of falls and rollator for longer distances, holding onto wall right now in the home  POSTURE COMMENTS:  rounded shoulders and weight shift right Sitting balance:  sits leaning to R side  ACTIVITY TOLERANCE: Activity tolerance: fatigues quickly  FUNCTIONAL OUTCOME MEASURES: FOTO: Not captured today due to difficulty logging on  UPPER EXTREMITY ROM:  BUEs grossly WNL except RUE mild decr wrist ext, finger ext, and supination   UPPER EXTREMITY MMT:     MMT Right eval Left eval  Shoulder flexion 4-/5 5/5  Shoulder abduction 4-/5 5/5  Shoulder adduction 4-/5 5/5  Shoulder extension    Shoulder internal rotation    Shoulder external rotation    Middle trapezius    Lower trapezius    Elbow flexion 4-/5 5/5  Elbow extension 4-/5 5/5  Wrist flexion    Wrist extension    Wrist ulnar deviation    Wrist radial deviation    Wrist pronation    Wrist supination    (Blank rows = not tested)  HAND FUNCTION: Grip strength: Right: 24.6 lbs; Left: 34.3 lbs 02/18/23: RT = 31.3 LBS  COORDINATION: 9 Hole Peg test: Right: 34.16 sec; Left: 26.50 sec 02/18/23: Rt = 30.61 sec  SENSATION: Pt reports mild numbness R hand which incr at time  EDEMA: none  noted  MUSCLE TONE: BUEs WNL  COGNITION: Overall cognitive status:  reports that it is harder to focus  VISION: Subjective report: Pt denies changes.   Baseline vision: Wears glasses for reading only    TODAY'S TREATMENT:  Self Care Tasks:  Pt engaged in home management tasks with safety education and AE considerations.    Pt given ideas re: simple meal prep ideas to begin trying ie) simple package foods ie) [employer] rice or pasta, mac and cheese or simple muffins from small pkg as these items are inexpensive, quick and easy.  Pt encouraged to read instructions fully ie) must mix water and pkg contents for rice side dish rather than boiling water first.    Pt shown and encouraged to practice safety ideas ie) move objects from stove to hot-pot holder on the counter to slide objects to the sink to drain.  Pt is discouraged from trying to carry a hot pot of food from the stove to the sink.  Pt also encouraged to consider protective gloves for cutting foods during meal prep.    Pt practiced cutting putty with knife and is encouraged to try practicing simple tasks ie) cutting lettuce vs cabbage which is what she said she would make with her meal.    Finally AE considerations explored with pt encouraged to consider tasks that are difficult over the next week to explore further compared to can opener and potato peeler ideas shared today.   PATIENT EDUCATION: Education details: Field seismologist in the kitchen  Person educated: Patient Education method: Explanation, Demonstration, and Verbal cues Education comprehension: verbalized understanding, returned demonstration, and needs further education  HOME EXERCISE PROGRAM: 02/18/23: Coordination and putty HEP    GOALS: Goals reviewed with patient? Yes  SHORT TERM GOALS: Target date: 02/27/23  Pt will be independent with  initial HEP for RUE coordination and strength. Goal status: IN PROGRESS 10/27 - reports doing putty and coordination exercises everyday, pinching ex, beads, cards - hard to flick one off the deck  2.  Pt will perform simple cooking tasks with supervision. Goal status: IN Progress 10/27 - not doing - mostly she is just using the microwave  3.  Pt will perform simple home maintenance tasks mod I. Goal status: IN Progress 10/27 - does her laundry and washing the showers  4.   Pt will improve R grip strength by at least 10lbs for opening containers. Baseline:  24.6lbs Goal status: IN PROGRESS (31 LBS)    5.  Pt will improve coordination for ADLs as shown by completing 9-hole peg test in 30sec or less with dominant RUE. Baseline:  34.16sec Goal status: MET (30 SEC)   6.  Pt will be able to perform simple drawing with AE/compensation strategies. Goal status: IN Progress 10/27 - encouraged to do word searches, coloring and writing lists/daily calendar    LONG TERM GOALS: Target date: 03/29/23  Pt will be independent with updated HEP for RUE coordination and strength. Baseline:  Goal status: IN Progress  2.  Pt will perform simple cooking tasks mod I. Baseline:  Goal status: IN Progress  3.  Pt will improve R grip strength by at least 10lbs for opening containers. Baseline:  24.6 Goal status: INITIAL  4.  Pt will improve coordination for ADLs as shown by completing 9-hole peg test in 30sec or less with dominant RUE. Baseline:  34.16sec Goal status: IN Progress  5.  Pt will complete mod complex home maintenance tasks mod I. Goal status: IN Progress  6.  Assess FOTO and establish goal. Baseline: not yet tested Goal status: INITIAL  ASSESSMENT:  CLINICAL IMPRESSION: Patient is a 42 y.o. female who was seen today for occupational therapy treatment for CVA.  Pt responded well to  simulated kitchen activities with mobility without cane moving along the counter and between table  and counter.  She is encouraged to spend short periods of time at home in her apt preparing for return home and practicing IADLS, finding things she nees help with that she can consult with OT staff etc.  Pt will benefit from continued occupational therapy to address these deficits for improved dominant RUE functional use and incr independence with ADLs/IADLs to return to prior level of function.   PERFORMANCE DEFICITS: in functional skills including ADLs, IADLs, coordination, dexterity, sensation, ROM, strength, Fine motor control, balance, endurance, decreased knowledge of use of DME, and UE functional use, cognitive skills including attention, and psychosocial skills including routines and behaviors.   IMPAIRMENTS: are limiting patient from ADLs, IADLs, work, and leisure.   CO-MORBIDITIES: may have co-morbidities  that affects occupational performance. Patient will benefit from skilled OT to address above impairments and improve overall function.  REHAB POTENTIAL: Good   PLAN:  OT FREQUENCY: 2x/week  OT DURATION: 8 weeks  +eval  PLANNED INTERVENTIONS: self care/ADL training, therapeutic exercise, therapeutic activity, neuromuscular re-education, manual therapy, passive range of motion, balance training, functional mobility training, moist heat, cryotherapy, patient/family education, cognitive remediation/compensation, energy conservation, and DME and/or AE instructions  RECOMMENDED OTHER SERVICES: none at this time  CONSULTED AND AGREED WITH PLAN OF CARE: Patient  PLAN FOR NEXT SESSION:  FOTO - complete for LTG review HEP, progress HEP with standing theraband for strength and balance for IADL goals Writing activities Complete cooking activities   Victorino Sparrow, OTR/L 02/25/2023, 5:27 PM

## 2023-02-25 NOTE — Therapy (Signed)
[facility] [address] [place], [place], [address] Phone: [phone]   Fax:  [phone]  Patient Details  Name: Lisa Werner MRN: [id number] Date of Birth: 07-May-1980 Referring Provider:  Jerene Pitch  Encounter Date: 02/25/2023  Session was arrive no charge; patient arrives to session with Dexcom pods recently purchased but not donned. Patient requires cue to press hard enough with Dexcom in order to unlock safety mechanism to don. Once pod in place but on by patient, pod takes 25 minutes to come online. Patient has no other way to check sugars and has not checked for several days. Given insulin dependent status not safe to continue PT session but may still be able to be seen for OT later today if readings are in place. Patient to call ride if readings are < 70 or greater than 250. Patient verbalizes understanding.   Carmelia Bake, PT, DPT 02/25/2023, 1:04 PM  [facility] [address] [place], [place], [address] Phone: [phone]   Fax:  [phone]

## 2023-02-27 ENCOUNTER — Encounter: Payer: Self-pay | Admitting: Physical Medicine and Rehabilitation

## 2023-02-27 ENCOUNTER — Encounter
Payer: BC Managed Care – PPO | Attending: Physical Medicine and Rehabilitation | Admitting: Physical Medicine and Rehabilitation

## 2023-02-27 VITALS — BP 118/87 | HR 92 | Ht 62.0 in | Wt 210.0 lb

## 2023-02-27 DIAGNOSIS — R269 Unspecified abnormalities of gait and mobility: Secondary | ICD-10-CM | POA: Diagnosis present

## 2023-02-27 DIAGNOSIS — I639 Cerebral infarction, unspecified: Secondary | ICD-10-CM | POA: Insufficient documentation

## 2023-02-27 DIAGNOSIS — M21371 Foot drop, right foot: Secondary | ICD-10-CM | POA: Insufficient documentation

## 2023-02-27 DIAGNOSIS — K5904 Chronic idiopathic constipation: Secondary | ICD-10-CM | POA: Diagnosis present

## 2023-02-27 DIAGNOSIS — R531 Weakness: Secondary | ICD-10-CM | POA: Diagnosis not present

## 2023-02-27 NOTE — Progress Notes (Signed)
Subjective:    Patient ID: Lisa Werner, female    DOB: 04/28/1981, 42 y.o.   MRN: [id number]  HPI  Patient is a 42 yr old R handed female with hx of recent L pontine stroke- in 12/2022; with very poorly controlled DMI with A1c of >15.5; Also has HTN; chronic hyponatremia; HLD; chronic leukocytosis; and morbid obesity- BMI 38.41 today Here for hospital f/u on her L pontine stroke.   Has not seen Neurology-  Only appointments she has are PT and OT per pt.   Hasn't seen PCP-  Called PCP to get Insulin refilled.   Is constipated- taking milk of magnesium- 1x/week usually-  Is having a BM 1x/week. If that- doesn't go if doesn't take MOM. And hurts if doesn't take MOM.     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   PT and OT- walking with cane- short distances but long distances, uses rollator- going for a walk- or went to  grocery store- needs to sit down and rest.    Had 1 fall since came home- first week out of hospital- couldn't get up off floor without help -knee buckled.    Using Dexcom to manage BG's-  Said her BG is running in 170s- but per dexcom reader, was 242 right now and up to 300 in last 24 hours-  Was going low to 44 at night- 2 weeks ago.  So fluctuates but normally in 170 range.  Per therapy note needed assistance to place dexcom in place on her arm on 10/28-    Stepmother brought her here today Is not in appt.    Sometimes has muscle spasms at night- wakes her up- stretching out her leg and arm.  Not excruciating pain   Social Hx:  Living with father and moving home by self and 54 year old son-  Was told put January 3rd for return to work- new semester 05/25/23.     Pain Inventory Average Pain 3 Pain Right Now 3 My pain is dull and tingling  LOCATION OF PAIN  thigh knee leg and toes  BOWEL Number of stools per  week: 6 Oral laxative use Yes  Type of laxative milk of magnesia  BLADDER Pads Bladder incontinence  has trouble getting to bathroom in time   Mobility use a cane use a walker how many minutes can you walk? 17 ability to climb steps?  yes  asking when she can drive again  Function employed # of hrs/week 40  what is your job? Runner, broadcasting/film/video I need assistance with the following:  household duties and shopping  Neuro/Psych bladder control problems bowel control problems trouble walking spasms  Prior Studies Any changes since last visit?  no going to outpt therapy  Physicians involved in your care Any changes since last visit?  no  advised to make appt with PCP for med mangement   Family History  Problem Relation Age of Onset   Arthritis Mother    Kidney disease Mother    Asthma Father    Diabetes Maternal Grandmother    Breast cancer Maternal Grandmother    Breast cancer Paternal Grandmother    Social History   Socioeconomic History   Marital status: Single    Spouse name: Not on file   Number of children: Not on file   Years of education: Not on file   Highest education  level: Not on file  Occupational History   Not on file  Tobacco Use   Smoking status: Never   Smokeless tobacco: Never  Vaping Use   Vaping status: Never Used  Substance and Sexual Activity   Alcohol use: No   Drug use: No   Sexual activity: Never  Other Topics Concern   Not on file  Social History Narrative   Not on file   Social Determinants of Health   Financial Resource Strain: Patient Declined (10/09/2022)   Received from [facility]   Overall Financial Resource Strain (CARDIA)    Difficulty of Paying Living Expenses: Patient declined  Food Insecurity: No Food Insecurity (12/20/2022)   Hunger Vital Sign    Worried About Running Out of Food in the Last Year: Never true    Ran Out of Food in the Last Year: Never true  Transportation Needs: No Transportation Needs (12/20/2022)    PRAPARE - Administrator, Civil Service (Medical): No    Lack of Transportation (Non-Medical): No  Physical Activity: Unknown (07/03/2022)   Received from [facility]   Exercise Vital Sign    Days of Exercise per Week: Patient declined    Minutes of Exercise per Session: 20 min  Stress: Patient Declined (07/03/2022)   Received from [facility] - Occupational Stress Questionnaire    Feeling of Stress : Patient declined  Social Connections: Socially Integrated (07/03/2022)   Received from [facility]   Social Network    How would you rate your social network (family, work, friends)?: Good participation with social networks   No past surgical history on file. Past Medical History:  Diagnosis Date   Diabetes mellitus without complication (HCC)    Hyperlipidemia    Hypertension    BP 118/87   Pulse 92   Ht 5\' 2"  (1.575 m)   Wt 210 lb (95.3 kg)   SpO2 94%   BMI 38.41 kg/m   Opioid Risk Score:   Fall Risk Score:  `1  Depression screen [facility] 2/9     02/27/2023   11:49 AM 11/19/2018    6:07 PM 11/19/2018   12:21 PM 05/31/2017    1:27 PM  Depression screen PHQ 2/9  Decreased Interest 0 1 1 0  Down, Depressed, Hopeless 0 1 1 0  PHQ - 2 Score 0 2 2 0  Altered sleeping 3 1 1    Tired, decreased energy 1 3 3    Change in appetite 0 3 3   Feeling bad or failure about yourself  0 3 3   Trouble concentrating 0 3 3   Moving slowly or fidgety/restless 3 0 0   Suicidal thoughts 0 1 1   PHQ-9 Score 7 16 16      Review of Systems  Constitutional: Negative.   HENT: Negative.    Eyes: Negative.   Respiratory: Negative.    Cardiovascular: Negative.   Gastrointestinal:  Positive for constipation.  Endocrine: Negative.   Genitourinary:        Trouble getting to bathroom in time  Musculoskeletal:  Positive for gait problem and myalgias.       Spasms especially at night cramping  Skin: Negative.   Allergic/Immunologic: Negative.    Hematological: Negative.   Psychiatric/Behavioral:  Positive for dysphoric mood.   All other systems reviewed and are negative.      Objective:   Physical Exam Awake, alert, no eye contact; leaning on cane, NAD  MSK:  RUE- biceps is 4/5; triceps 3-/5; WE 4/5; grip 4/5 and FA 2/5 RLE- 5-/5 in RLE- in HF, KE, KF, DF and PF  On MMT, however at ocunter standing on toes and heels- cannot do either, so more like 4/5 at best in DF/PF Neuro: No hoffman's in RUE No clonus in RLE Don't see increased tone- she describes spasms- but don't see any- however circadian rhythm of spasticity shows that spasticity best at this time of day.    Gait; severe circumduction of RLE- when walks- slows down gait substantially- leans on cane to walk And mild foot slap noted, even with circumduction.      Assessment & Plan:    Patient is a 42 yr old female with hx of recent L pontine stroke- in 12/2022; with very poorly controlled DMI with A1c of >15.5; Also has HTN; chronic hyponatremia; HLD; chronic leukocytosis; and morbid obesity- BMI 38.41 today Here for hospital f/u on her L pontine stroke.    Call Neurology- [phone]- at [facility]- to get an appointment- needs to be seen 4-6 weeks after left rehab on 01/08/23. They will determine when cane drive and if needs to be out of work longer.   2. Asking for me to fill out ST disability paperwork- will fill out- goal to return to work 05/25/23-    3. Need to ask Neurology when can return to work and return to driving.    4.  AFO didn't help with her walking per pt and therapy notes- so will not order an AFO at this time.    5. Filled out paperwork for st disability-  Took 18 minutes to complete  6. F/U 3 months- to f/u on spasticity- if gets worse call me before next appt- if gets better, can cancel next appointment.   7. Spasticity can progress- tightness as well as spasms- that can be painful- doing range of motion the better- showed  her how to do range of motion- of joint sin R arm and walking daily is good.   8. Senna you can take up to 4 tablets/day- start with 2 tabs/day- then work up to 4 tabs/2 tabs 2x/day if needed.    I spent a total of 41   minutes on total care today- >50% coordination of care- due to filled out paperwork- as well as discussed spasticity, constipation and what each doctor does.

## 2023-02-27 NOTE — Patient Instructions (Signed)
Patient is a 42 yr old female with hx of recent L pontine stroke- in 12/2022; with very poorly controlled DMI with A1c of >15.5; Also has HTN; chronic hyponatremia; HLD; chronic leukocytosis; and morbid obesity- BMI 38.41 today Here for hospital f/u on her L pontine stroke.    Call Neurology- [phone]- at [facility]- to get an appointment- needs to be seen 4-6 weeks after left rehab on 01/08/23. They will determine when cane drive and if needs to be out of work longer.   2. Asking for me to fill out ST disability paperwork- will fill out- goal to return to work 05/25/23-    3. Need to ask Neurology when can return to work and return to driving.    4.  AFO didn't help with her walking per pt and therapy notes- so will not order an AFO at this time.    5. Filled out paperwork for st disability-  Took 18 minutes to complete  6. F/U 3 months- to f/u on spasticity- if gets worse call me before next appt- if gets better, can cancel next appointment.   7. Spasticity can progress- tightness as well as spasms- that can be painful- doing range of motion the better- showed her how to do range of motion- of joint sin R arm and walking daily is good.   8. Senna you can take up to 4 tablets/day- start with 2 tabs/day- then work up to 4 tabs/2 tabs 2x/day if needed.

## 2023-03-01 ENCOUNTER — Ambulatory Visit: Payer: BC Managed Care – PPO | Attending: Internal Medicine | Admitting: Physical Therapy

## 2023-03-01 ENCOUNTER — Encounter: Payer: Self-pay | Admitting: Physical Therapy

## 2023-03-01 VITALS — BP 122/72 | HR 100

## 2023-03-01 DIAGNOSIS — M6281 Muscle weakness (generalized): Secondary | ICD-10-CM | POA: Diagnosis present

## 2023-03-01 DIAGNOSIS — R278 Other lack of coordination: Secondary | ICD-10-CM | POA: Diagnosis present

## 2023-03-01 DIAGNOSIS — R29898 Other symptoms and signs involving the musculoskeletal system: Secondary | ICD-10-CM | POA: Insufficient documentation

## 2023-03-01 DIAGNOSIS — R2689 Other abnormalities of gait and mobility: Secondary | ICD-10-CM | POA: Insufficient documentation

## 2023-03-01 DIAGNOSIS — R29818 Other symptoms and signs involving the nervous system: Secondary | ICD-10-CM | POA: Diagnosis present

## 2023-03-01 DIAGNOSIS — R2681 Unsteadiness on feet: Secondary | ICD-10-CM | POA: Insufficient documentation

## 2023-03-01 DIAGNOSIS — R208 Other disturbances of skin sensation: Secondary | ICD-10-CM | POA: Diagnosis present

## 2023-03-01 NOTE — Therapy (Signed)
OUTPATIENT PHYSICAL THERAPY NEURO TREATMENT   Patient Name: Lisa Werner MRN: [id number] DOB:28-Feb-1981, 42 y.o., female Today's Date: 03/01/2023   PCP: Zoe Lan, MD REFERRING PROVIDER: Jacquelynn Cree, [name]  END OF SESSION:  PT End of Session - 03/01/23 1018     Visit Number 9    Number of Visits 13    Date for PT Re-Evaluation 03/25/23    Authorization Type Blue [employer] Blue [employer]    PT Start Time 1016    PT Stop Time 1043    PT Time Calculation (min) 27 min    Equipment Utilized During Treatment Gait belt    Activity Tolerance Patient tolerated treatment well    Behavior During Therapy WFL for tasks assessed/performed            Past Medical History:  Diagnosis Date   Diabetes mellitus without complication (HCC)    Hyperlipidemia    Hypertension    History reviewed. No pertinent surgical history. 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   Left pontine stroke (HCC) 12/24/2022   CVA (cerebral vascular accident) (HCC) 12/20/2022   Acute ischemic stroke (HCC) 12/19/2022   Leukocytosis 12/19/2022   Acute right-sided weakness 12/19/2022   Essential hypertension 02/21/2019   Noncompliance with diabetes treatment 07/17/2017   Mixed hyperlipidemia 06/03/2017   Pain of left eye 06/03/2017   Urinary frequency 06/03/2017   DM2 (diabetes mellitus, type 2) (HCC) 09/28/2013    ONSET DATE: 01/09/2023 (referral date)  REFERRING DIAG: I63.9 (ICD-10-CM) - Cerebral infarction, unspecified  THERAPY DIAG:  Unsteadiness on feet  Muscle weakness (generalized)  Other abnormalities of gait and mobility  Other disturbances of skin sensation  Rationale for Evaluation and Treatment: Rehabilitation  SUBJECTIVE:                                                                                                                                                                                              SUBJECTIVE STATEMENT:  Patient reports that she is doing good. States she wants to work on her walking today. Denies falls and near falls.   Pt accompanied by: self  PERTINENT HISTORY: LAD/T IDDM, HTN, morbid obesity, acute infarct in left pons on 12/19/2022  PAIN:  Are you having pain? No   PRECAUTIONS: Monitor Sugar levels and fall  RED FLAGS: Bowel or bladder incontinence: Yes: reports constipation but is being medically managed    WEIGHT BEARING RESTRICTIONS: No  FALLS: Has patient fallen in last 6 months? Yes. 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  DIAGNOSTIC FINDINGS:   MR Brain wo contrast 12/19/2022 IMPRESSION: 1. Acute infarct in the left pons. 2. No intracranial large vessel occlusion or significant stenosis. 3. 1-2 mm laterally directed outpouching from the proximal right A2, which may represent a tiny aneurysm versus an infundibulum.    TODAY'S TREATMENT:                                                                                                                               VITALS:  Vitals:   03/01/23 1024  BP: 122/72  Pulse: 100  Taken seated on LUE  TherAct:   Sugar levels: 258 dropped to 255 in a few minutes, ranges from 258-249 during therapy;  discontinued session early as noted below  Assessed sugars and discussed safe readings for therapy   Gait Training  Gait pattern: decreased stance time- Right and poor foot clearance- Right Distance walked: 600 feet outdoors, 1 x 130 feet indoors Assistive device utilized: Single point cane Level of assistance: SBA and CGA Comments: outdoors with SPC, increased difficulty with sequencing today compared to last visit, multiple episodes of catching foot on cane, continued to demonstrate difficulty indoors with cane, very different from when last seen, patient tells therapist she is feeing alright but has only had a kit kat to eat this morning, given elevated readings/changes in gait noted from last session and inability to provide snack recommended holding further exercises until patient is able to both get more to eat and lower glucose levels so she can tolerated session better, will reassess appropriateness with SPC at that time, advised patient to follow up with PCP  PATIENT EDUCATION:  Education details: Continue HEP + safe glucose readings for therapy Person educated: Patient Education method: Explanation Education comprehension: verbalized understanding and needs further education  HOME EXERCISE PROGRAM:  Access Code: [email or web address] URL: [email or web address] Date: 02/01/2023 Prepared by: Maryruth Eve  Exercises - Staggered Sit-to-Stand  - 1 x daily - 7 x weekly - 2 sets - 10 reps - Semi-Tandem Corner Balance With Eyes Closed  - 1 x daily - 7 x weekly - 3 sets - 30 seconds hold  GOALS: Goals reviewed with patient? Yes  SHORT TERM GOALS: Target date: 02/19/2023   Patient will demonstrate independence with initial HEP  to continue to progress between physical therapy sessions.   Baseline: Reports good understanding of exercises for home Goal status: MET  2.  Patient will improve gait speed to 0.74 m/s with LRAD to indicate improvement towards the level of community  ambulator in order to participate more easily in activities outside of the home.   Baseline: 0.64 m/s with rollator; 0.74 m/s with SPC (SBA) and 0.62 m/s with rollator (modI) (10/25)  Goal status: MET  3.  to be evaluated and LTG written as indicated Baseline: 938 feet with rollator RPE 7/10 Goal status: MET   LONG TERM GOALS: Target date: 03/25/2023  Patient will demonstrate independence with initial HEP to continue to progress between physical therapy sessions.   Baseline: To be prov Goal status: INITIAL  2.  Patient will improve gait speed to 0.84 m/s with LRAD to indicate improvement to the level of community ambulator in order to participate more easily in activities outside of the home.   Baseline: 0.64 m/s with rollator Goal status: INITIAL  3.  Patient will improve to 1030 feet with LRAD to demonstrate improved aerobic capacity and endurance in order to participate more easily in daily walking tasks.   Baseline: 938 feet with rollator RPE 7/10, 950 ft w/ SPC RPE 7/10 Goal status: IN PROGRESS  4. Patient will improve their 5x Sit to Stand score to less than 12 seconds to demonstrate a decreased risk for falls and improved LE strength.   Baseline: 15.15 seconds with UE use Goal status: INITIAL  5.  Patient will improve TUG score to 15 seconds with LRAD to indicate a decreased risk of falls and demonstrate improved overall mobility.   Baseline: 18.25 seconds with rollator (SBA) Goal status: INITIAL  ASSESSMENT:  CLINICAL IMPRESSION:  Skilled PT session emphasized gait training with SPC; however, session had to ended early as patient glucose readings 249-258 during session and patient gait pattern notably more impaired than when last here for prior session. Patient had not had full breakfast so was concerned about insulin at this time; recommend patient follow up with PCP for management. Recommend reassessing safety with SPC next session and may need to modify  recommendation pending findings. Continue POC.   OBJECTIVE IMPAIRMENTS: Abnormal gait, decreased balance, decreased endurance, decreased mobility, difficulty walking, decreased ROM, decreased strength, and pain.   ACTIVITY LIMITATIONS: carrying, bending, standing, transfers, and locomotion level  PARTICIPATION LIMITATIONS: meal prep, cleaning, medication management, community activity, and yard work  PERSONAL FACTORS: Time since onset of injury/illness/exacerbation and 3+ comorbidities: see above  are also affecting patient's functional outcome.   REHAB POTENTIAL: Fair will require proper management of blood sugar in order to progress  CLINICAL DECISION MAKING: Evolving/moderate complexity  EVALUATION COMPLEXITY: Moderate  PLAN:  PT FREQUENCY: 2x/week  PT DURATION: 6 weeks  PLANNED INTERVENTIONS: Therapeutic exercises, Therapeutic activity, Neuromuscular re-education, Balance training, Gait training, Patient/Family education, Self Care, Aquatic Therapy, Manual therapy, and Re-evaluation  PLAN FOR NEXT SESSION:  add to HEP with emphasis on balance and NMR for R side, training with SPC (pt owns),  high level strength and balance work, monitor blood sugar, work on steps up and balance on compliant surface particularly with dynamic tasks, resistive gait, work on [facility] over grass or with step overs etc  Reassess gait with SPC + 10th visit progress note   Carmelia Bake, PT, DPT 03/01/2023, 11:10 AM

## 2023-03-04 ENCOUNTER — Encounter: Payer: BC Managed Care – PPO | Admitting: Speech Pathology

## 2023-03-04 ENCOUNTER — Ambulatory Visit: Payer: BC Managed Care – PPO | Admitting: Physical Therapy

## 2023-03-04 ENCOUNTER — Ambulatory Visit: Payer: BC Managed Care – PPO | Admitting: Occupational Therapy

## 2023-03-04 VITALS — BP 119/91 | HR 104

## 2023-03-04 DIAGNOSIS — R29898 Other symptoms and signs involving the musculoskeletal system: Secondary | ICD-10-CM

## 2023-03-04 DIAGNOSIS — R2689 Other abnormalities of gait and mobility: Secondary | ICD-10-CM

## 2023-03-04 DIAGNOSIS — R278 Other lack of coordination: Secondary | ICD-10-CM

## 2023-03-04 DIAGNOSIS — M6281 Muscle weakness (generalized): Secondary | ICD-10-CM

## 2023-03-04 DIAGNOSIS — R2681 Unsteadiness on feet: Secondary | ICD-10-CM

## 2023-03-04 DIAGNOSIS — R29818 Other symptoms and signs involving the nervous system: Secondary | ICD-10-CM

## 2023-03-04 NOTE — Therapy (Signed)
OUTPATIENT PHYSICAL THERAPY NEURO TREATMENT- 10th VISIT PROGRESS NOTE   Patient Name: Lisa Werner MRN: [id number] DOB:1980-12-11, 42 y.o., female Today's Date: 03/04/2023  Physical Therapy Progress Note   Dates of Reporting Period: 01/28/23-03/04/23  See Note below for Objective Data and Assessment of Progress/Goals.  Thank you for the referral of this patient. Peter Congo, PT, DPT    PCP: Zoe Lan, MD REFERRING PROVIDER: Jacquelynn Cree, [name]  END OF SESSION:  PT End of Session - 03/04/23 1315     Visit Number 10    Number of Visits 13    Date for PT Re-Evaluation 03/25/23    Authorization Type Blue Cross Blue Shield    PT Start Time 1315    PT Stop Time 1400    PT Time Calculation (min) 45 min    Equipment Utilized During Treatment Gait belt    Activity Tolerance Patient tolerated treatment well    Behavior During Therapy WFL for tasks assessed/performed             Past Medical History:  Diagnosis Date   Diabetes mellitus without complication (HCC)    Hyperlipidemia    Hypertension    No past surgical history on file. 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   Left pontine stroke (HCC) 12/24/2022   CVA (cerebral vascular accident) (HCC) 12/20/2022   Acute ischemic stroke (HCC) 12/19/2022   Leukocytosis 12/19/2022   Acute right-sided weakness 12/19/2022   Essential hypertension 02/21/2019   Noncompliance with diabetes treatment 07/17/2017   Mixed hyperlipidemia 06/03/2017   Pain of left eye 06/03/2017   Urinary frequency 06/03/2017   DM2 (diabetes mellitus, type 2) (HCC) 09/28/2013    ONSET DATE: 01/09/2023 (referral date)  REFERRING DIAG: I63.9 (ICD-10-CM) - Cerebral infarction, unspecified  THERAPY DIAG:  Unsteadiness on feet  Muscle  weakness (generalized)  Other abnormalities of gait and mobility  Rationale for Evaluation and Treatment: Rehabilitation  SUBJECTIVE:                                                                                                                                                                                             SUBJECTIVE STATEMENT:  Patient reports that she is "ready to get this over with". Denies falls and near falls. Came here directly from [facility] "So I couldn't grab my normal shoes". Did some walking in the sand, noticed difficulty with the cane sinking into the sand.  Did have lunch before coming.   Pt accompanied by: self  PERTINENT HISTORY: LAD/T IDDM, HTN, morbid obesity, acute infarct in left pons on 12/19/2022  PAIN:  Are you having pain?  Legs are tired    PRECAUTIONS: Monitor Sugar levels and fall  RED FLAGS: Bowel or bladder incontinence: Yes: reports constipation but is being medically managed    WEIGHT BEARING RESTRICTIONS: No  FALLS: Has patient fallen in last 6 months? Yes. 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  DIAGNOSTIC FINDINGS:   MR Brain wo contrast 12/19/2022 IMPRESSION: 1. Acute infarct in the left pons. 2. No intracranial large vessel occlusion or significant stenosis. 3. 1-2 mm laterally directed outpouching from the proximal right A2, which may represent  a tiny aneurysm versus an infundibulum.    TODAY'S TREATMENT:                                                                                                                              TherAct  Vitals:   03/04/23 1323  BP: (!) 119/91  Pulse: (!) 104  Taken seated on LUE, seated  Sugar levels: 225 (1:18 pm), <200 (2:00 pm)  On return from bathroom near end of session, pt w/ near fall when going to sit in chair, pt reaching for chair from perpendicular at a large distance and unaware of airex still on floor, trips but catches self with help of PT and SPT to recover to sitting Pt educated on safety when sitting, including scanning environment, backing up to chair, and making sure she can feel the chair behind her legs before sitting  Gait Training Outdoors ambulation Gait pattern: decreased stance time- Right and poor foot clearance- Right Distance walked: >1000 feet outdoors, 1 x 130 feet indoors Assistive device utilized: Single point cane Level of assistance: SBA and CGA Comments: over various surfaces including tile, thresholds, rubber mats, sidewalk, inclines and declines, up and down curbs, grass, hills and over tree roots/leafy surfaces Pt with improved SPC patterning this date, with consistent 2-pt gait about 95% of the time     HR 119 bpm and O2 98% at completion of outdoors ambulation  TherEx STS w/ LLE elevated X10 repetitions on 4" box, pushing from armrests X10 repetitions on 4" box, pushing from knees, pt reports feeling it in quad more 2 sets x10 repetitions on airex pad, pushing  from knees Pt rates all STS activities as an 8/10 RPE  PATIENT EDUCATION:  Education details: SPC safety in community, safe sitting, continue HEP Person educated: Patient Education method: Explanation Education comprehension: verbalized understanding and needs further education  HOME EXERCISE PROGRAM:  Access Code: [email or web address] URL: [email or web address] Date:  02/01/2023 Prepared by: Maryruth Eve  Exercises - Staggered Sit-to-Stand  - 1 x daily - 7 x weekly - 2 sets - 10 reps - Semi-Tandem Corner Balance With Eyes Closed  - 1 x daily - 7 x weekly - 3 sets - 30 seconds hold  GOALS: Goals reviewed with patient? Yes  SHORT TERM GOALS: Target date: 02/19/2023   Patient will demonstrate independence with initial HEP to continue to progress between physical therapy sessions.   Baseline: Reports good understanding of exercises for home Goal status: MET  2.  Patient will improve gait speed to 0.74 m/s with LRAD to indicate improvement towards the level of community ambulator in order to participate more easily in activities outside of the home.   Baseline: 0.64 m/s with rollator; 0.74 m/s with SPC (SBA) and 0.62 m/s with rollator (modI) (10/25)  Goal status: MET  3.  to be evaluated and LTG written as indicated Baseline: 938 feet with rollator RPE 7/10 Goal status: MET   LONG TERM GOALS: Target date: 03/25/2023  Patient will demonstrate independence with initial HEP to continue to progress between physical therapy sessions.   Baseline: To be prov Goal status: INITIAL  2.  Patient will improve gait speed to 0.84 m/s with LRAD to indicate improvement to the level of community ambulator in order to participate more easily in activities outside of the home.   Baseline: 0.64 m/s with rollator Goal status: INITIAL  3.  Patient will improve to 1030 feet with LRAD to demonstrate improved aerobic capacity and endurance in order to participate more easily in daily walking tasks.   Baseline: 938 feet with rollator RPE 7/10, 950 ft w/ SPC RPE 7/10 Goal status: IN PROGRESS  4. Patient will improve their 5x Sit to Stand score to less than 12 seconds to demonstrate a decreased risk for falls and improved LE strength.   Baseline: 15.15 seconds with UE use Goal status: INITIAL  5.  Patient will improve TUG score to 15 seconds with LRAD to  indicate a decreased risk of falls and demonstrate improved overall mobility.   Baseline: 18.25 seconds with rollator (SBA) Goal status: INITIAL  ASSESSMENT:  CLINICAL IMPRESSION:  Skilled PT session emphasized gait training with SPC outdoors and RLE strengthening. Pt with improving consistency of patterning with SPC and good balance over various unstable surfaces outdoors. Pt with poor safety awareness, attempting to sit in a chair from a perpendicular position and rotating hips w/ a near fall, catching self with assistance from PT and SPT; educated to scan environment, back up squarely to the chair and feel the chair before sitting. Continue POC.   OBJECTIVE IMPAIRMENTS: Abnormal gait, decreased balance, decreased endurance, decreased mobility, difficulty walking, decreased ROM, decreased strength, and pain.   ACTIVITY LIMITATIONS: carrying, bending, standing, transfers, and locomotion level  PARTICIPATION LIMITATIONS: meal prep, cleaning, medication management, community activity, and yard work  PERSONAL FACTORS: Time since onset of injury/illness/exacerbation and 3+ comorbidities: see above  are also affecting patient's functional outcome.   REHAB POTENTIAL: Fair will require proper management of blood sugar in order to progress  CLINICAL DECISION MAKING: Evolving/moderate complexity  EVALUATION COMPLEXITY: Moderate  PLAN:  PT FREQUENCY: 2x/week  PT DURATION: 6 weeks  PLANNED INTERVENTIONS: Therapeutic exercises, Therapeutic activity, Neuromuscular re-education, Balance training, Gait training, Patient/Family education, Self Care, Aquatic Therapy, Manual therapy, and Re-evaluation  PLAN FOR NEXT SESSION:  add to HEP with emphasis on balance and NMR for R side, training with SPC (pt owns),  high level strength and balance work, monitor blood sugar, work on steps up and balance on compliant surface particularly with dynamic tasks, resistive gait, work on [employer] over grass or with step  overs etc   Beverely Low, Student-PT 03/04/2023, 2:16 PM

## 2023-03-04 NOTE — Therapy (Unsigned)
OUTPATIENT OCCUPATIONAL THERAPY NEURO TREATMENT  Patient Name: Lisa Werner MRN: [id number] DOB:04/24/1981, 42 y.o., female Today's Date: 03/04/2023  PCP: Dr. Julieanne Manson REFERRING PROVIDER: Delle Reining, [name]  END OF SESSION:  OT End of Session - 03/04/23 1415     Visit Number 4    Number of Visits 17    Date for OT Re-Evaluation 03/29/23    Authorization Type [employer] covered 100%, no visit limit, no auth    OT Start Time 1418    OT Stop Time 1505    OT Time Calculation (min) 47 min    Activity Tolerance Patient tolerated treatment well    Behavior During Therapy WFL for tasks assessed/performed             Past Medical History:  Diagnosis Date   Diabetes mellitus without complication (HCC)    Hyperlipidemia    Hypertension    No past surgical history on file. 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   Left pontine stroke (HCC) 12/24/2022   CVA (cerebral vascular accident) (HCC) 12/20/2022   Acute ischemic stroke (HCC) 12/19/2022   Leukocytosis 12/19/2022   Acute right-sided weakness 12/19/2022   Essential hypertension 02/21/2019   Noncompliance with diabetes treatment 07/17/2017   Mixed hyperlipidemia 06/03/2017   Pain of left eye 06/03/2017   Urinary frequency 06/03/2017   DM2 (diabetes mellitus, type 2) (HCC) 09/28/2013    ONSET DATE: 12/19/22 hospitalized (weakness x4 days prior)  REFERRING DIAG: I69.30 (ICD-10-CM) - Unspecified sequelae of cerebral infarction  THERAPY DIAG:  Muscle weakness (generalized)  Other lack of coordination  Other symptoms and signs involving the nervous system  Other symptoms and signs involving the musculoskeletal system  Rationale for Evaluation and Treatment: Rehabilitation  SUBJECTIVE:   SUBJECTIVE  STATEMENT:  Pt reported her blood sugar was good today, it was 207 at beginning of session.  Pt reported that she has still been having having cramps (1-2) at night but they are more inconvenient than painful when her arm stretches out.  She did see the Dr with no changes in medicines due to cramps.    Pt accompanied by: self  PERTINENT HISTORY: Left pontine stroke with R-sided weakness and cognitive deficits.  Hospitalized 12/19/22-01/09/23.  PMH:    Mixed hyperlipidemia,  Essential hypertension, LADA (latent autoimmune diabetes in adults), managed as type 1 (HCC), Persistent leucocytosis ,Hyponatremia   PRECAUTIONS: Fall and Other: no driving  WEIGHT BEARING RESTRICTIONS: No  PAIN:  Are you having pain? No  FALLS: Has patient fallen in last 6 months? Yes. Number of falls 2-3 falls before she knew she had the stroke  LIVING ENVIRONMENT: Lives with:  staying with father currently, but lived alone with 57 y.o. son prior   Lives in: Other condo Stairs: Yes: External: 3 steps; can reach both Has following equipment at home: Dan Humphreys - 4 wheeled and shower chair  PLOF: Independent, Vocation/Vocational requirements: middle Estate agent, and Leisure: enjoys drawing, teaching   PATIENT GOALS: to get back to "my regular self"  OBJECTIVE:   HAND DOMINANCE: Right  ADLs:  Transfers/ambulation related to ADLs:  mod I Eating: some drops, can open water bottle now Grooming: mod I UB Dressing: mod I, sometimes shirt gets tangled  LB Dressing: difficulty with pants, unsure about tying  Toileting: mod I,  but difficulty with hygiene with R hand  Bathing: mod I Tub Shower transfers: mod I Equipment: Shower seat with back  IADLs: Shopping: needs transportation, mod I in store Light housekeeping: pt folding clothes, but family assisting Meal Prep: family performing Community mobility: unable to drive currently Medication management: father sets up meds, pt doing finger sticks  and insulin Financial management: mod I per pt  Handwriting:  difficulty, incr time   MOBILITY STATUS: Hx of falls and rollator for longer distances, holding onto wall right now in the home  POSTURE COMMENTS:  rounded shoulders and weight shift right Sitting balance:  sits leaning to R side  ACTIVITY TOLERANCE: Activity tolerance: fatigues quickly  FUNCTIONAL OUTCOME MEASURES: FOTO: Not captured today due to difficulty logging on  UPPER EXTREMITY ROM:  BUEs grossly WNL except RUE mild decr wrist ext, finger ext, and supination   UPPER EXTREMITY MMT:     MMT Right eval Left eval  Shoulder flexion 4-/5 5/5  Shoulder abduction 4-/5 5/5  Shoulder adduction 4-/5 5/5  Shoulder extension    Shoulder internal rotation    Shoulder external rotation    Middle trapezius    Lower trapezius    Elbow flexion 4-/5 5/5  Elbow extension 4-/5 5/5  Wrist flexion    Wrist extension    Wrist ulnar deviation    Wrist radial deviation    Wrist pronation    Wrist supination    (Blank rows = not tested)  HAND FUNCTION: Grip strength: Right: 24.6 lbs; Left: 34.3 lbs 02/18/23: RT = 31.3 LBS  COORDINATION: 9 Hole Peg test: Right: 34.16 sec; Left: 26.50 sec 02/18/23: Rt = 30.61 sec  SENSATION: Pt reports mild numbness R hand which incr at time  EDEMA: none noted  MUSCLE TONE: BUEs WNL  COGNITION: Overall cognitive status:  reports that it is harder to focus  VISION: Subjective report: Pt denies changes.   Baseline vision: Wears glasses for reading only    TODAY'S TREATMENT:                                                                                                                               Neuromuscular reeducation provided, including specific visual, tactile and verbal cues and physical assistance to stimulate the neuromuscular system and promote functional ROM, movement and use of LUE for activities such as full ROM of shoulder, elbow, forearm, wrist and digits with  HEP also issued.  Right UE ROM/stretches and Putty Exercises/Activities conducted with visual demo and return demonstration sought using mirror for feedback. HEP included - Seated Finger PIP Extension PROM  - due to  limited full extension of L PIP jts - Seated Wrist Prayer Stretch  - to help with wrist and digital ext - Seated Wrist Supination Stretch  - due to limited full supination of L forearm - Standing Elbow Flexion Extension AROM  -  - Standing Shoulder Flexion Full Range  - encouraged to do  so in mirror to help with increased equal ROM  - Standing Shoulder Internal Rotation Stretch with Hands Behind Back  - to work on reaching her bottom/back  Initiated Putty Exercises with putty previously issued to continue strengthening, coordination and sensory stimulation of B UEs.  Patient provided visual demonstration, verbal and tactile cues as needed to improve performance of the various exercises/activities including:   - Putty Squeezes - cues to squeeze putty into log for use with other exercises and to fold putty in half with 1 hand  - Putty Rolls - encourage to roll putty into logs with sensory stimulation to entire length of hand, fingers and wrist as needed   - Pinch and Pull with Putty - this motion is combined with different pinches (3-Point Pinch, Tip Pinch, Key Pinch) - patient encouraged to combine tripod, pincer and/or key pinch with "pinch and pull" motion of putty pulling away from midline, changing between different pinches and changing different directions to change grip   - Removing Objects from Putty  - encouraged to hide items (coins, marble, dice etc) and use one hand at a time to find the objects and identify them by tactile input before s/he digs them out and can see them visually.   - Finger Extension with Putty  - encouraged to work on opening all fingers and individual fingers   PATIENT EDUCATION: Education details: HEP ideas  Person educated: Patient Education  method: Explanation, Demonstration, Verbal cues, and Handouts Education comprehension: verbalized understanding, returned demonstration, and needs further education  HOME EXERCISE PROGRAM: 02/18/23: Coordination and putty HEP  03/04/23: UE ROM and Putty Exercises Access Code: [email or web address]   GOALS: Goals reviewed with patient? Yes  SHORT TERM GOALS: Target date: 02/27/23  Pt will be independent with initial HEP for RUE coordination and strength. Goal status: IN PROGRESS 10/27 - reports doing putty and coordination exercises everyday, pinching ex, beads, cards - hard to flick one off the deck  2.  Pt will perform simple cooking tasks with supervision. Goal status: IN Progress 10/27 - not doing - mostly she is just using the microwave  3.  Pt will perform simple home maintenance tasks mod I. Goal status: IN Progress 10/27 - does her laundry and washing the showers  4.   Pt will improve R grip strength by at least 10lbs for opening containers. Baseline:  24.6lbs Goal status: IN PROGRESS (31 LBS)    5.  Pt will improve coordination for ADLs as shown by completing 9-hole peg test in 30sec or less with dominant RUE. Baseline:  34.16sec Goal status: MET (30 SEC)   6.  Pt will be able to perform simple drawing with AE/compensation strategies. Goal status: IN Progress 10/27 - encouraged to do word searches, coloring and writing lists/daily calendar    LONG TERM GOALS: Target date: 03/29/23  Pt will be independent with updated HEP for RUE coordination and strength. Baseline:  Goal status: IN Progress  2.  Pt will perform simple cooking tasks mod I. Baseline:  Goal status: IN Progress  3.  Pt will improve R grip strength by at least 10lbs for opening containers. Baseline:  24.6 Goal status: IN Progress  4.  Pt will improve coordination for ADLs as shown by completing 9-hole peg test in 30sec or less with dominant RUE. Baseline:  34.16sec Goal status: IN Progress  5.  Pt will  complete mod complex home maintenance tasks mod I. Goal status: IN Progress  6.  Assess FOTO and establish  goal. Baseline: not yet tested Goal status: INITIAL  ASSESSMENT:  CLINICAL IMPRESSION: Patient is a 42 y.o. female who was seen today for occupational therapy treatment for CVA.  Pt responded well HEP ideas for maximizing L UE ROM and comparison to R side.  Pt will benefit from continued OT interventions to address these deficits for improved dominant RUE functional use and incr independence with ADLs/IADLs to return to prior level of function.   PERFORMANCE DEFICITS: in functional skills including ADLs, IADLs, coordination, dexterity, sensation, ROM, strength, Fine motor control, balance, endurance, decreased knowledge of use of DME, and UE functional use, cognitive skills including attention, and psychosocial skills including routines and behaviors.   IMPAIRMENTS: are limiting patient from ADLs, IADLs, work, and leisure.   CO-MORBIDITIES: may have co-morbidities  that affects occupational performance. Patient will benefit from skilled OT to address above impairments and improve overall function.  REHAB POTENTIAL: Good   PLAN:  OT FREQUENCY: 2x/week  OT DURATION: 8 weeks  +eval  PLANNED INTERVENTIONS: self care/ADL training, therapeutic exercise, therapeutic activity, neuromuscular re-education, manual therapy, passive range of motion, balance training, functional mobility training, moist heat, cryotherapy, patient/family education, cognitive remediation/compensation, energy conservation, and DME and/or AE instructions  RECOMMENDED OTHER SERVICES: none at this time  CONSULTED AND AGREED WITH PLAN OF CARE: Patient  PLAN FOR NEXT SESSION:  FOTO - complete for LTG Review/progress HEP including with standing theraband for strength and balance for IADL goals Writing activities Complete cooking activities   Victorino Sparrow, OTR/L 03/04/2023, 3:13 PM

## 2023-03-04 NOTE — Patient Instructions (Signed)
Right UE ROM/stretches and Putty Exercises/Activities   Access Code: [email or web address] URL: [email or web address] Date: 03/04/2023 Prepared by: Amada Kingfisher  Exercises - Seated Finger PIP Extension PROM  - 2 x daily - 10 reps - Seated Wrist Prayer Stretch  - 2 x daily - 10 reps - Seated Wrist Supination Stretch  - 2 x daily - 10 reps - Standing Elbow Flexion Extension AROM  - 2 x daily - 10 reps - Standing Shoulder Flexion Full Range  - 2 x daily - 10 reps - Standing Shoulder Internal Rotation Stretch with Hands Behind Back  - 2 x daily - 10 reps - Putty Squeezes  - 1 x daily - 10 reps - Rolling Putty on Table  - 1 x daily - 10 reps - Finger Pinch and Pull with Putty  - 1 x daily - 10 reps - Tip PUSH with Putty  - 1 x daily - 10 reps - Key Pinch with Putty  - 1 x daily - 10 reps - 3-Point Pinch with Putty  - 1 x daily - 10 reps - Removing Marbles from Putty  - 1 x daily - 10 reps - Finger Extension with Putty  - 1 x daily - 10 reps

## 2023-03-06 ENCOUNTER — Ambulatory Visit: Payer: BC Managed Care – PPO | Admitting: Adult Health

## 2023-03-06 ENCOUNTER — Encounter: Payer: Self-pay | Admitting: Adult Health

## 2023-03-06 VITALS — BP 108/70 | HR 100 | Ht 62.0 in | Wt 207.0 lb

## 2023-03-06 DIAGNOSIS — I639 Cerebral infarction, unspecified: Secondary | ICD-10-CM | POA: Diagnosis not present

## 2023-03-06 DIAGNOSIS — E782 Mixed hyperlipidemia: Secondary | ICD-10-CM

## 2023-03-06 DIAGNOSIS — E119 Type 2 diabetes mellitus without complications: Secondary | ICD-10-CM

## 2023-03-06 DIAGNOSIS — Z794 Long term (current) use of insulin: Secondary | ICD-10-CM | POA: Diagnosis not present

## 2023-03-06 NOTE — Progress Notes (Signed)
PATIENT: Lisa Werner DOB: 1980/05/23  REASON FOR VISIT: follow up HISTORY FROM: patient PRIMARY NEUROLOGIST:   HISTORY OF PRESENT ILLNESS: Today 03/06/23  Lisa Werner is a 42 y.o. female here for hospital follow-up due to stroke.   The patient, a 42 year old with a history of diabetes and recent pontine stroke, presents for follow-up. She reports persistent right-sided weakness despite ongoing physical therapy. She denies any other deficits from the stroke. Her blood sugars have been running below 200, which she attributes to dietary changes. She is currently on Crestor 40mg  for elevated LDL cholesterol levels, which she is compliant with. She was discharged on aspirin and Plavix for three weeks post-stroke, but is now only on aspirin. She denies any mood changes or feelings of depression or anxiety, but expresses eagerness to return to her normal life and work as a Clinical cytogeneticist.     Discussed the use of AI scribe software for clinical note transcription with the patient, who gave verbal consent to proceed.  HISTORY Ms. Lisa Werner is a 42 y.o. female with history of hypertension, hyperlipidemia and diabetes presenting with right-sided weakness since Saturday 8/17.  She was found to be hypoglycemic with capillary blood glucose of 536 on admission.  Patient states that she does not check her blood sugar at home and stopped wearing her glucose monitor because it beeped too much.  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  While she does have several apparent risk factors for stroke, considering her age, she will need workup of stroke in young patient.   Acute Ischemic Infarct:  left pontine stroke, subacute, likely small vessel disease  CT head No acute abnormality.  Atherosclerotic dolichoectasia of the basilar artery CTA head & neck areas of caliber change in proximal left V2 segment, concerning for dissection vs.  Hypoplastic. no intracranial LVO, mild stenosis in distal right supraclinoid ICA MRI acute left pontine infarct MRA LVO or significant stenosis, 1 to 2 mm laterally directed outpouching from proximal right A2, which may represent a tiny aneurysm versus an infundibulum.  2D Echo EF 65 to 70%, no atrial level shunt, left atrium normal in size LDL 180 HgbA1c pending UDS neg VTE prophylaxis -SCDs No antithrombotic prior to admission, now on aspirin 81 mg daily and clopidogrel 75 mg daily for 3 weeks and then aspirin alone. Therapy recommendations:  CIR Disposition: Pending  REVIEW OF SYSTEMS: Out of a complete 14 system review of symptoms, the patient complains only of the following symptoms, and all other reviewed systems are negative.  ALLERGIES: Allergies  Allergen Reactions   Pollen Extract     HOME MEDICATIONS: Outpatient Medications Prior to Visit  Medication Sig Dispense Refill   acetaminophen (TYLENOL) 325 MG tablet Take 1-2 tablets (325-650 mg total) by mouth every 4 (four) hours as needed for mild pain.     AgaMatrix Ultra-Thin Lancets MISC Check blood glucose before meals 3 times daily. 100 each 11   albuterol (VENTOLIN HFA) 108 (90 Base) MCG/ACT inhaler Inhale 2 puffs into the lungs every 6 (six) hours as needed for wheezing.     aspirin EC 81 MG tablet Take 1 tablet (81 mg total) by mouth daily. Swallow whole. 120 tablet 0   Blood Glucose Monitoring Suppl (AGAMATRIX PRESTO) w/Device KIT Check blood glucose 3 times daily before meals 1 kit 0   glucose blood (AGAMATRIX PRESTO TEST) test strip Check blood glucose 3 times daily before meals 100 each  12   insulin aspart (NOVOLOG FLEXPEN) 100 UNIT/ML FlexPen Inject 18 Units into the skin 3 (three) times daily with meals. 15 mL 0   Insulin Pen Needle (BD PEN NEEDLE NANO U/F) 32G X 4 MM MISC Use 4 (four) times daily. 100 each 0   magnesium oxide (MAG-OX) 400 (240 Mg) MG tablet Take 1 tablet (400 mg total) by mouth daily. 120 tablet 0    melatonin 3 MG TABS tablet Take 1 tablet (3 mg total) by mouth at bedtime as needed (insomnia). 60 tablet 0   rosuvastatin (CRESTOR) 40 MG tablet Take 1 tablet (40 mg total) by mouth at bedtime. 30 tablet 0   senna-docusate (SENOKOT-S) 8.6-50 MG tablet Take 2 tablets by mouth at bedtime. 120 tablet 0   TRESIBA FLEXTOUCH 100 UNIT/ML FlexTouch Pen Inject 33 Units into the skin daily AND 23 Units at bedtime. 15 mL 1   No facility-administered medications prior to visit.    PAST MEDICAL HISTORY: Past Medical History:  Diagnosis Date   Diabetes mellitus without complication (HCC)    Hyperlipidemia    Hypertension     PAST SURGICAL HISTORY: No past surgical history on file.  FAMILY HISTORY: Family History  Problem Relation Age of Onset   Arthritis Mother    Kidney disease Mother    Asthma Father    Diabetes Maternal Grandmother    Breast cancer Maternal Grandmother    Breast cancer Paternal Grandmother     SOCIAL HISTORY: Social History   Socioeconomic History   Marital status: Single    Spouse name: Not on file   Number of children: Not on file   Years of education: Not on file   Highest education level: Not on file  Occupational History   Not on file  Tobacco Use   Smoking status: Never   Smokeless tobacco: Never  Vaping Use   Vaping status: Never Used  Substance and Sexual Activity   Alcohol use: No   Drug use: No   Sexual activity: Never  Other Topics Concern   Not on file  Social History Narrative   Not on file   Social Determinants of Health   Financial Resource Strain: Patient Declined (10/09/2022)   Received from [facility]   Overall Financial Resource Strain (CARDIA)    Difficulty of Paying Living Expenses: Patient declined  Food Insecurity: No Food Insecurity (12/20/2022)   Hunger Vital Sign    Worried About Running Out of Food in the Last Year: Never true    Ran Out of Food in the Last Year: Never true  Transportation Needs: No Transportation  Needs (12/20/2022)   PRAPARE - Administrator, Civil Service (Medical): No    Lack of Transportation (Non-Medical): No  Physical Activity: Unknown (07/03/2022)   Received from [facility]   Exercise Vital Sign    Days of Exercise per Week: Patient declined    Minutes of Exercise per Session: 20 min  Stress: Patient Declined (07/03/2022)   Received from [facility] - Occupational Stress Questionnaire    Feeling of Stress : Patient declined  Social Connections: Socially Integrated (07/03/2022)   Received from [facility]   Social Network    How would you rate your social network (family, work, friends)?: Good participation with social networks  Intimate Partner Violence: Not At Risk (12/20/2022)   Humiliation, Afraid, Rape, and Kick questionnaire    Fear of Current or Ex-Partner: No  Emotionally Abused: No    Physically Abused: No    Sexually Abused: No      PHYSICAL EXAM  Vitals:   03/06/23 1100  BP: 108/70  Pulse: 100  Weight: 207 lb (93.9 kg)  Height: 5\' 2"  (1.575 m)   Body mass index is 37.86 kg/m.  Generalized: Well developed, in no acute distress   Neurological examination  Mentation: Alert oriented to time, place, history taking. Follows all commands speech and language fluent Cranial nerve II-XII: Pupils were equal round reactive to light. Extraocular movements were full, visual field were full on confrontational test. Facial sensation and strength were normal. Uvula tongue midline. Head turning and shoulder shrug  were normal and symmetric. Motor: The motor testing reveals 5 over 5 strength of all 4 extremities. Good symmetric motor tone is noted throughout.  Sensory: Sensory testing is intact to soft touch on all 4 extremities. No evidence of extinction is noted.  Coordination: Cerebellar testing reveals good finger-nose-finger and heel-to-shin bilaterally.  Gait and station: Mild circumduction type gait on  the right.  Uses a cane when ambulating Reflexes: Deep tendon reflexes are symmetric and normal bilaterally.   DIAGNOSTIC DATA (LABS, IMAGING, TESTING) - I reviewed patient records, labs, notes, testing and imaging myself where available.  Lab Results  Component Value Date   WBC 13.7 (H) 01/07/2023   HGB 11.4 (L) 01/07/2023   HCT 36.3 01/07/2023   MCV 90.3 01/07/2023   PLT 355 01/07/2023      Component Value Date/Time   NA 134 (L) 01/07/2023 0659   NA 132 (L) 03/04/2019 1834   K 4.2 01/07/2023 0659   CL 102 01/07/2023 0659   CO2 24 01/07/2023 0659   GLUCOSE 210 (H) 01/07/2023 0659   BUN 14 01/07/2023 0659   BUN 13 03/04/2019 1834   CREATININE 0.98 01/07/2023 0659   CALCIUM 8.9 01/07/2023 0659   PROT 7.1 12/25/2022 0754   PROT 7.1 11/19/2018 1136   ALBUMIN 2.8 (L) 12/25/2022 0754   ALBUMIN 4.0 11/19/2018 1136   AST 24 12/25/2022 0754   ALT 21 12/25/2022 0754   ALKPHOS 76 12/25/2022 0754   BILITOT 0.5 12/25/2022 0754   BILITOT 0.2 11/19/2018 1136   GFRNONAA >60 01/07/2023 0659   GFRAA 102 03/04/2019 1834   Lab Results  Component Value Date   CHOL 265 (H) 12/20/2022   HDL 36 (L) 12/20/2022   LDLCALC 180 (H) 12/20/2022   TRIG 245 (H) 12/20/2022   CHOLHDL 7.4 12/20/2022   Lab Results  Component Value Date   HGBA1C >15.5 (H) 12/20/2022   No results found for: "VITAMINB12" Lab Results  Component Value Date   TSH 1.66 05/31/2017      ASSESSMENT AND PLAN 42 y.o. year old female  has a past medical history of Diabetes mellitus without complication (HCC), Hyperlipidemia, and Hypertension. here with:  Acute Ischemic Infarct:  left pontine stroke, subacute, likely small vessel disease    Continue aspirin 81 mg daily  for secondary stroke prevention.   Discussed secondary stroke prevention measures and importance of close PCP follow up for aggressive stroke risk factor management. I have gone over the pathophysiology of stroke, warning signs and symptoms, risk  factors and their management in some detail with instructions to go to the closest emergency room for symptoms of concern. HTN: BP goal <130/90.   HLD: LDL goal <70. Recent LDL 180 on Rosuvastatin 40 mg daily  DMII: A1c goal<7.0. 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  They suggested that she do a return to driving program.  I have attached these instructions to her after visit summary. FU with our office 7 months     Butch Penny, MSN, NP-C 03/06/2023, 10:43 AM [facility] [address], [address] [place], [place] [address] [phone]

## 2023-03-06 NOTE — Patient Instructions (Addendum)
Your Plan:  Continue ASA  Blood pressure goal <130/90 Cholesterol LDL goal <70 Diabetes goal A1c <7 Monitor diet and try to exercise   Discussed with physical therapy.  They recommend that you do a return to driving program.  Instructions are below:  RETURN TO DRIVING PLAN ONLY IF CLEARED BY DOCTOR:    WITH THE SUPERVISION OF A LICENSED DRIVER, PLEASE DRIVE IN AN EMPTY PARKING LOT FOR AT LEAST 2-3 TRIALS TO TEST REACTION TIME, VISION, USE OF EQUIPMENT IN CAR, ETC.    IF SUCCESSFUL WITH THE PARKING LOT DRIVING, PROCEED TO SUPERVISED DRIVING TRIALS IN YOUR NEIGHBORHOOD STREETS AT LOW TRAFFIC TIMES TO TEST OBSERVATION TO TRAFFIC SIGNALS, REACTION TIME, ETC. PLEASE ATTEMPT AT LEAST 2-3 TRIALS IN YOUR NEIGHBORHOOD.    IF NEIGHBORHOOD DRIVING IS SUCCESSFUL, YOU MAY PROCEED TO DRIVING IN BUSIER AREAS IN YOUR COMMUNITY WITH SUPERVISION OF A LICENSED DRIVER. PLEASE ATTEMPT AT LEAST 4-5 TRIALS.   FINALLY, YOU MAY GRADUALLY RETURN TO INTERSTATE AND NIGHTTIME DRIVING WITH SUPERVISION.   Thank you for coming to see [place] at [facility]. I hope we have been able to provide you high quality care today.  You may receive a patient satisfaction survey over the next few weeks. We would appreciate your feedback and comments so that we may continue to improve ourselves and the health of our patients.

## 2023-03-06 NOTE — Progress Notes (Signed)
Handicap placard application completed/ signed, given to pt.

## 2023-03-07 ENCOUNTER — Telehealth: Payer: Self-pay | Admitting: *Deleted

## 2023-03-07 NOTE — Telephone Encounter (Signed)
I called pt Lisa Werner for her that I have AVS for her relating to driving program per PT that [facility] NP had written for her. I was not sure if she had received at check out.  She is to let me know.

## 2023-03-07 NOTE — Telephone Encounter (Signed)
-----   Message from Butch Penny sent at 03/06/2023  3:48 PM EST ----- After her visit today I discussed with physical therapy about driving.  I placed information on her after visit summary about return to driving program.  Please call patient and review this with her.  Let her know that it is on her after visit summary which is on her MyChart.

## 2023-03-07 NOTE — Telephone Encounter (Signed)
I called pt and LMVM for pt that will place driving instructions in mail.  She can call if she has questions.  Placed AVS in mail.

## 2023-03-08 ENCOUNTER — Ambulatory Visit: Payer: BC Managed Care – PPO | Admitting: Physical Therapy

## 2023-03-11 ENCOUNTER — Ambulatory Visit: Payer: BC Managed Care – PPO | Admitting: Occupational Therapy

## 2023-03-11 ENCOUNTER — Ambulatory Visit: Payer: BC Managed Care – PPO | Admitting: Physical Therapy

## 2023-03-11 ENCOUNTER — Telehealth: Payer: Self-pay | Admitting: Physical Therapy

## 2023-03-11 NOTE — Progress Notes (Signed)
I agree with the above plan 

## 2023-03-11 NOTE — Telephone Encounter (Signed)
Therapist attempted to call patient. Unable to leave VM. Second no show to most recent PT visit. Will require likely D/C if next visit is no show and new referral for PT.  Maryruth Eve, PT, DPT

## 2023-03-13 ENCOUNTER — Encounter: Payer: Self-pay | Admitting: Occupational Therapy

## 2023-03-13 ENCOUNTER — Telehealth: Payer: Self-pay | Admitting: Occupational Therapy

## 2023-03-13 ENCOUNTER — Encounter: Payer: Self-pay | Admitting: Physical Therapy

## 2023-03-13 NOTE — Therapy (Signed)
[facility] [address] [place], [place], [address] Phone: [phone]   Fax:  [phone]  Patient Details  Name: Lisa Werner MRN: [id number] Date of Birth: July 18, 1980 Referring Provider:  No ref. provider found  Encounter Date: 03/13/2023   Patient with several no call/no show appointments. When patient contacted about missed appointments she expressed her wishes to d/c from PT at this time and continue to rehab independently at home. Patient to be d/c from OPPT services at this time and will need a new referral to resume services in the future.   Peter Congo, PT Peter Congo, PT, DPT, CSRS  03/13/2023, 9:13 AM  [facility] [address] [place], [place], [address] Phone: [phone]   Fax:  [phone]

## 2023-03-13 NOTE — Therapy (Signed)
[facility] [address] [place], [place], [address] Phone: [phone]   Fax:  [phone]  Patient Details  Name: Syniah Dienes MRN: [id number] Date of Birth: May 21, 1980 Referring Provider:  No ref. provider found  Encounter Date: 03/13/2023  OCCUPATIONAL THERAPY DISCHARGE SUMMARY   Visits from Start of Care: 4   Current functional level related to goals / functional outcomes: Pt was improving in all areas of deficits identified at eval but had only met 2 of the 12 established goals before today.  She was contacted via telephone after a missed visit this week and has asked to cancel her further appointments.  She is pleased with her progress thus far and feels as if she can continue with the activities she has been given at home.   Remaining deficits: Pt has some remaining functional deficits in strength, coordination and ADLS/IADLS but is improving in all areas and is generally Mod I with self care.  Education / Equipment: Pt has materials and education that has been provided during treatment sessions for ROM, coordination and strengthening activities. Pt verbalizes understanding of how to continue on with self-management.    Patient agrees to discharge from outpatient occupational therapy at this time due to personal request.  Victorino Sparrow, OT 03/13/2023, 9:09 AM  [facility] [address] [place], [place], [address] Phone: [phone]   Fax:  [phone]

## 2023-03-13 NOTE — Discharge Summary (Deleted)
Patient Name: Lisa Werner MRN: [id number] DOB:Jun 24, 1980, 42 y.o., female Today's Date: 03/13/2023  OCCUPATIONAL THERAPY DISCHARGE SUMMARY  Visits from Start of Care: 4  Current functional level related to goals / functional outcomes: Pt was improving in all areas of deficits identified at eval but had only met 2 of the 12 established goals before today.  She was contacted via telephone after a missed visit this week and has asked to cancel her further appointments.  She is pleased with her progress thus far and feels as if she can continue with the activities she has been given at home.   Remaining deficits: Pt has some remaining functional deficits in strength, coordination and ADLS/IADLS but is improving in all areas and is generally Mod I with self care.  Education / Equipment: Pt has materials and education that has been provided during treatment sessions for ROM, coordination and strengthening activities. Pt verbalizes understanding of how to continue on with self-management.   Patient agrees to discharge from outpatient occupational therapy at this time due to personal request.   Victorino Sparrow, OT 03/13/2023, 8:53 AM

## 2023-03-13 NOTE — Telephone Encounter (Signed)
OTR contacted pt re: missed visit yesterday and she indicated that she would like to cancel her remaining appts (Friday and Monday).  She is satisfied with her progress and feels that she can continue with her activities that she has been given at home.

## 2023-03-15 ENCOUNTER — Ambulatory Visit: Payer: BC Managed Care – PPO | Admitting: Physical Therapy

## 2023-03-18 ENCOUNTER — Ambulatory Visit: Payer: BC Managed Care – PPO | Admitting: Physical Therapy

## 2023-03-18 ENCOUNTER — Encounter: Payer: BC Managed Care – PPO | Admitting: Speech Pathology

## 2023-03-18 ENCOUNTER — Encounter: Payer: BC Managed Care – PPO | Admitting: Occupational Therapy

## 2023-05-29 ENCOUNTER — Ambulatory Visit: Payer: BC Managed Care – PPO | Admitting: Physical Medicine and Rehabilitation

## 2023-09-09 ENCOUNTER — Encounter: Payer: Self-pay | Admitting: Gastroenterology

## 2023-09-14 ENCOUNTER — Emergency Department (HOSPITAL_COMMUNITY)
Admission: EM | Admit: 2023-09-14 | Discharge: 2023-09-14 | Disposition: A | Discharge status: Home / Self Care | Attending: Emergency Medicine | Admitting: Emergency Medicine

## 2023-09-14 ENCOUNTER — Other Ambulatory Visit: Payer: Self-pay

## 2023-09-14 ENCOUNTER — Emergency Department (HOSPITAL_COMMUNITY)

## 2023-09-14 DIAGNOSIS — E876 Hypokalemia: Secondary | ICD-10-CM | POA: Diagnosis not present

## 2023-09-14 DIAGNOSIS — R8281 Pyuria: Secondary | ICD-10-CM | POA: Insufficient documentation

## 2023-09-14 DIAGNOSIS — E119 Type 2 diabetes mellitus without complications: Secondary | ICD-10-CM | POA: Diagnosis not present

## 2023-09-14 DIAGNOSIS — Z8673 Personal history of transient ischemic attack (TIA), and cerebral infarction without residual deficits: Secondary | ICD-10-CM | POA: Diagnosis not present

## 2023-09-14 DIAGNOSIS — R059 Cough, unspecified: Secondary | ICD-10-CM | POA: Diagnosis present

## 2023-09-14 DIAGNOSIS — I10 Essential (primary) hypertension: Secondary | ICD-10-CM | POA: Insufficient documentation

## 2023-09-14 DIAGNOSIS — R0602 Shortness of breath: Secondary | ICD-10-CM | POA: Diagnosis not present

## 2023-09-14 DIAGNOSIS — D72829 Elevated white blood cell count, unspecified: Secondary | ICD-10-CM | POA: Diagnosis not present

## 2023-09-14 DIAGNOSIS — Z794 Long term (current) use of insulin: Secondary | ICD-10-CM | POA: Insufficient documentation

## 2023-09-14 DIAGNOSIS — Z7982 Long term (current) use of aspirin: Secondary | ICD-10-CM | POA: Insufficient documentation

## 2023-09-14 DIAGNOSIS — Z79899 Other long term (current) drug therapy: Secondary | ICD-10-CM | POA: Insufficient documentation

## 2023-09-14 DIAGNOSIS — R112 Nausea with vomiting, unspecified: Secondary | ICD-10-CM | POA: Insufficient documentation

## 2023-09-14 DIAGNOSIS — R8271 Bacteriuria: Secondary | ICD-10-CM | POA: Insufficient documentation

## 2023-09-14 DIAGNOSIS — R0789 Other chest pain: Secondary | ICD-10-CM | POA: Diagnosis not present

## 2023-09-14 LAB — URINALYSIS, ROUTINE W REFLEX MICROSCOPIC
Bilirubin Urine: NEGATIVE
Glucose, UA: >=500 mg/dL — AB
Hgb urine dipstick: NEGATIVE
Ketones, ur: NEGATIVE mg/dL
Nitrite: NEGATIVE
Protein, ur: 100 mg/dL — AB
Specific Gravity, Urine: 1.026 (ref 1.005–1.030)
WBC, UA: >50 WBC/hpf (ref 0–5)
pH: 5 (ref 5.0–8.0)

## 2023-09-14 LAB — CBC
HCT: 38.5 % (ref 36.0–46.0)
Hemoglobin: 12 g/dL (ref 12.0–15.0)
MCH: 27.8 pg (ref 26.0–34.0)
MCHC: 31.2 g/dL (ref 30.0–36.0)
MCV: 89.3 fL (ref 80.0–100.0)
Platelets: 373 10*3/uL (ref 150–400)
RBC: 4.31 MIL/uL (ref 3.87–5.11)
RDW: 14.3 % (ref 11.5–15.5)
WBC: 18.6 10*3/uL — ABNORMAL HIGH (ref 4.0–10.5)
nRBC: 0 % (ref 0.0–0.2)

## 2023-09-14 LAB — HCG, SERUM, QUALITATIVE: Preg, Serum: NEGATIVE

## 2023-09-14 LAB — COMPREHENSIVE METABOLIC PANEL WITH GFR
ALT: 22 U/L (ref 0–44)
AST: 23 U/L (ref 15–41)
Albumin: 3.7 g/dL (ref 3.5–5.0)
Alkaline Phosphatase: 107 U/L (ref 38–126)
Anion gap: 12 (ref 5–15)
BUN: 10 mg/dL (ref 6–20)
CO2: 25 mmol/L (ref 22–32)
Calcium: 9.5 mg/dL (ref 8.9–10.3)
Chloride: 100 mmol/L (ref 98–111)
Creatinine, Ser: 1.1 mg/dL — ABNORMAL HIGH (ref 0.44–1.00)
GFR, Estimated: >60 mL/min (ref >60)
Glucose, Bld: 261 mg/dL — ABNORMAL HIGH (ref 70–99)
Potassium: 3.3 mmol/L — ABNORMAL LOW (ref 3.5–5.1)
Sodium: 137 mmol/L (ref 135–145)
Total Bilirubin: 0.6 mg/dL (ref 0.0–1.2)
Total Protein: 8.3 g/dL — ABNORMAL HIGH (ref 6.5–8.1)

## 2023-09-14 LAB — TROPONIN I (HIGH SENSITIVITY): Troponin I (High Sensitivity): 3 ng/L (ref <18)

## 2023-09-14 LAB — BRAIN NATRIURETIC PEPTIDE: B Natriuretic Peptide: 35.6 pg/mL (ref 0.0–100.0)

## 2023-09-14 LAB — LIPASE, BLOOD: Lipase: 32 U/L (ref 11–51)

## 2023-09-14 MED ORDER — SODIUM CHLORIDE 0.9 % IV BOLUS
1000.0000 mL | Freq: Once | INTRAVENOUS | Status: AC
  Administered 2023-09-14: 1000 mL via INTRAVENOUS

## 2023-09-14 MED ORDER — ONDANSETRON HCL 4 MG PO TABS: 4.0000 mg | ORAL_TABLET | Freq: Four times a day (QID) | ORAL | 0 refills | Status: DC

## 2023-09-14 MED ORDER — CETIRIZINE-PSEUDOEPHEDRINE ER 5-120 MG PO TB12: 1.0000 | ORAL_TABLET | Freq: Every day | ORAL | 0 refills | Status: DC

## 2023-09-14 MED ORDER — BENZONATATE 100 MG PO CAPS: 100.0000 mg | ORAL_CAPSULE | Freq: Three times a day (TID) | ORAL | 0 refills | Status: DC | PRN

## 2023-09-14 MED ORDER — ONDANSETRON HCL 4 MG/2ML IJ SOLN
4.0000 mg | Freq: Once | INTRAMUSCULAR | Status: AC
  Administered 2023-09-14: 4 mg via INTRAVENOUS
  Filled 2023-09-14: qty 2

## 2023-09-14 MED ORDER — POTASSIUM CHLORIDE CRYS ER 20 MEQ PO TBCR
40.0000 meq | EXTENDED_RELEASE_TABLET | Freq: Once | ORAL | Status: AC
  Administered 2023-09-14: 40 meq via ORAL
  Filled 2023-09-14: qty 2

## 2023-09-14 NOTE — ED Provider Notes (Addendum)
 [facility] EMERGENCY DEPARTMENT AT [facility] Provider Note   CSN: [id number] Arrival date & time: 09/14/23  0801     History  Chief Complaint  Patient presents with   Emesis   Nausea   Cough   HPI Lisa Werner is a 43 y.o. female with history of type 2 diabetes, CVA, hypertension hyperlipidemia who presenting for cough and nausea vomiting.  She states she has had a nonproductive cough for about 2 months.  She reports that in the last 3 days she is began to vomit after persistent coughing.  She states that at times the center of her chest can hurt after coughing.  The chest tenderness is nonradiating and goes away after coughing.  She denies chest pain at this time.  Does state that she is intermittently short of breath.  Denies no lower extremity edema.  Denies OCP use, recent long travel, recent surgeries or immobilization and history of blood clots.  Denies abdominal pain.  Had a normal bowel movement yesterday.  Denies urinary symptoms.   Emesis Associated symptoms: cough   Cough      Home Medications Prior to Admission medications   Medication Sig Start Date End Date Taking? Authorizing Provider  benzonatate  (TESSALON ) 100 MG capsule Take 1 capsule (100 mg total) by mouth 3 (three) times daily as needed for cough. 09/14/23  Yes Ariya Bohannon K, [name]  cetirizine -pseudoephedrine  (ZYRTEC -D) 5-120 MG tablet Take 1 tablet by mouth daily. 09/14/23  Yes Eliette Drumwright K, [name]  ondansetron  (ZOFRAN ) 4 MG tablet Take 1 tablet (4 mg total) by mouth every 6 (six) hours. 09/14/23  Yes Uzair Godley K, [name]  acetaminophen  (TYLENOL ) 325 MG tablet Take 1-2 tablets (325-650 mg total) by mouth every 4 (four) hours as needed for mild pain. 01/08/23   Love, Renay Carota, [name]  AgaMatrix Ultra-Thin Lancets MISC Check blood glucose before meals 3 times daily. 11/19/18   Ronalee Cocking, MD  albuterol (VENTOLIN HFA) 108 (90 Base) MCG/ACT inhaler Inhale 2 puffs into the lungs  every 6 (six) hours as needed for wheezing. 11/21/22   [provider]  aspirin  EC 81 MG tablet Take 1 tablet (81 mg total) by mouth daily. Swallow whole. 01/08/23   Love, Renay Carota, [name]  Blood Glucose Monitoring Suppl (AGAMATRIX PRESTO) w/Device KIT Check blood glucose 3 times daily before meals 11/19/18   Ronalee Cocking, MD  glucose blood (AGAMATRIX PRESTO TEST) test strip Check blood glucose 3 times daily before meals 11/19/18   Ronalee Cocking, MD  insulin  aspart (NOVOLOG  FLEXPEN) 100 UNIT/ML FlexPen Inject 18 Units into the skin 3 (three) times daily with meals. 01/08/23   Love, Renay Carota, [name]  Insulin  Pen Needle (BD PEN NEEDLE NANO U/F) 32G X 4 MM MISC Use 4 (four) times daily. 01/08/23   Love, Renay Carota, [name]  magnesium  oxide (MAG-OX) 400 (240 Mg) MG tablet Take 1 tablet (400 mg total) by mouth daily. 01/08/23   Love, Renay Carota, [name]  melatonin 3 MG TABS tablet Take 1 tablet (3 mg total) by mouth at bedtime as needed (insomnia). 01/08/23   Love, Renay Carota, [name]  rosuvastatin  (CRESTOR ) 40 MG tablet Take 1 tablet (40 mg total) by mouth at bedtime. 01/08/23   Love, Renay Carota, [name]  senna-docusate (SENOKOT-S) 8.6-50 MG tablet Take 2 tablets by mouth at bedtime. 01/08/23   Love, Renay Carota, [name]  TRESIBA  FLEXTOUCH 100 UNIT/ML FlexTouch Pen Inject 33 Units into the skin daily AND 23 Units at bedtime. 01/08/23  Zelda Hickman, [name]      Allergies    Pollen extract    Review of Systems   Review of Systems  Respiratory:  Positive for cough.   Gastrointestinal:  Positive for vomiting.    Physical Exam Updated Vital Signs BP 125/85 (BP Location: Left Arm)   Pulse (!) 102   Temp 98.6 F (37 C) (Oral)   Resp 18   Ht 2' 0.5" (0.622 m)   Wt 90.7 kg   SpO2 93%   BMI 234.26 kg/m  Physical Exam Vitals and nursing note reviewed.  HENT:     Head: Normocephalic and atraumatic.     Mouth/Throat:     Mouth: Mucous membranes are moist.  Eyes:     General:        Right eye: No discharge.         Left eye: No discharge.     Conjunctiva/sclera: Conjunctivae normal.  Cardiovascular:     Rate and Rhythm: Normal rate and regular rhythm.     Pulses: Normal pulses.     Heart sounds: Normal heart sounds.  Pulmonary:     Effort: Pulmonary effort is normal.     Breath sounds: Normal breath sounds. No wheezing, rhonchi or rales.  Abdominal:     General: Abdomen is flat. There is no distension.     Palpations: Abdomen is soft.     Tenderness: There is no abdominal tenderness.  Skin:    General: Skin is warm and dry.  Neurological:     General: No focal deficit present.  Psychiatric:        Mood and Affect: Mood normal.     ED Results / Procedures / Treatments   Labs (all labs ordered are listed, but only abnormal results are displayed) Labs Reviewed  CBC - Abnormal; Notable for the following components:      Result Value   WBC 18.6 (*)    All other components within normal limits  COMPREHENSIVE METABOLIC PANEL WITH GFR - Abnormal; Notable for the following components:   Potassium 3.3 (*)    Glucose, Bld 261 (*)    Creatinine, Ser 1.10 (*)    Total Protein 8.3 (*)    All other components within normal limits  URINALYSIS, ROUTINE W REFLEX MICROSCOPIC - Abnormal; Notable for the following components:   APPearance CLOUDY (*)    Glucose, UA >=500 (*)    Protein, ur 100 (*)    Leukocytes,Ua MODERATE (*)    Bacteria, UA FEW (*)    All other components within normal limits  HCG, SERUM, QUALITATIVE  LIPASE, BLOOD  BRAIN NATRIURETIC PEPTIDE  TROPONIN I (HIGH SENSITIVITY)    EKG EKG Interpretation Date/Time:  Saturday Sep 14 2023 08:13:16 EDT Ventricular Rate:  105 PR Interval:  153 QRS Duration:  74 QT Interval:  329 QTC Calculation: 435 R Axis:   -60  Text Interpretation: Sinus tachycardia Left anterior fascicular block Low voltage, precordial leads no sig change from previous Confirmed by Wynetta Heckle [phone]) on 09/14/2023 8:15:59 AM  Radiology DG Chest 2  View Result Date: 09/14/2023 CLINICAL DATA:  43 year old female with cough, chest pain, shortness of breath, weakness. EXAM: CHEST - 2 VIEW COMPARISON:  Portable chest 12/25/2022 and earlier. FINDINGS: PA and lateral views 0834 hours. Lung volumes, heart size and mediastinal contours are normal. Visualized tracheal air column is within normal limits. Combined hair and breast attenuation artifact. Both lungs appear clear. No pneumothorax or pleural effusion. No acute osseous abnormality  identified. Negative visible bowel gas. IMPRESSION: Negative.  No cardiopulmonary abnormality. Electronically Signed   By: Marlise Simpers M.D.   On: 09/14/2023 08:45    Procedures Procedures    Medications Ordered in ED Medications  potassium chloride  SA (KLOR-CON  M) CR tablet 40 mEq (has no administration in time range)  sodium chloride  0.9 % bolus 1,000 mL (1,000 mLs Intravenous New Bag/Given 09/14/23 0842)  ondansetron  (ZOFRAN ) injection 4 mg (4 mg Intravenous Given 09/14/23 0843)    ED Course/ Medical Decision Making/ A&P Clinical Course as of 09/14/23 1123  Sat Sep 14, 2023  0814 BMI (Calculated): 36.57 [JR]    Clinical Course User Index [JR] Janalee Mcmurray, [name]                                 Medical Decision Making Amount and/or Complexity of Data Reviewed Labs: ordered. Radiology: ordered.  Risk OTC drugs. Prescription drug management.   Initial Impression and Ddx 43 year old well-appearing female presenting for cough and emesis and intermittent shortness of breath.  Exam was unremarkable.  DDx includes CHF exacerbation, pneumonia, ACS, PE, electrolyte derangement, intra-abdominal infection, electrolyte derangement, DKA, other. Patient PMH that increases complexity of ED encounter:  history of type 2 diabetes, CVA, hypertension hyperlipidemia  Interpretation of Diagnostics - I independent reviewed and interpreted the labs as followed: leukocytosis (18.6), hypokalemia (3.3), pyuria,  bacteruria  - I independently visualized the following imaging with scope of interpretation limited to determining acute life threatening conditions related to emergency care: CXR, which revealed no acute process  - I personally reviewed  and interpreted EKG which revealed sinus tachycardia  Patient Reassessment and Ultimate Disposition/Management On reassessment, no chest pain or Shob per patient. HR rate improved after fluids. Work up reassuring. Symptoms could be related to allergies.  Fluid challenge without complication.  Advised taking antihistamines and follow-up with her PCP.  She may need pulmonology follow-up as well if her symptoms persist which are also recommended.  Considered PE but unlikely given no chest pain or shortness of breath during this encounter, no evidence DVT and chest pain is likely due to persistent coughing.  Patient management required discussion with the following services or consulting groups:  None  Complexity of Problems Addressed Acute complicated illness or Injury  Additional Data Reviewed and Analyzed Further history obtained from: Past medical history and medications listed in the EMR and Prior ED visit notes  Patient Encounter Risk Assessment Prescriptions         Final Clinical Impression(s) / ED Diagnoses Final diagnoses:  Cough, unspecified type  Nausea and vomiting, unspecified vomiting type    Rx / DC Orders ED Discharge Orders          Ordered    cetirizine -pseudoephedrine  (ZYRTEC -D) 5-120 MG tablet  Daily        09/14/23 1123    benzonatate  (TESSALON ) 100 MG capsule  3 times daily PRN        09/14/23 1123    ondansetron  (ZOFRAN ) 4 MG tablet  Every 6 hours        09/14/23 1123              Janalee Mcmurray, [name] 09/14/23 1122    Janalee Mcmurray, [name] 09/14/23 1123    Wynetta Heckle, MD 09/18/23 1535

## 2023-09-14 NOTE — Discharge Instructions (Addendum)
 Evaluation today was overall reassuring.  Suspect the cough could be related to allergies.  Sent Zyrtec to your pharmacy.  Also sent Tessalon to help with your cough would recommend that you take it at night.  For the vomiting I sent Zofran  to your pharmacy.  If you have fever, chest pain or shortness of breath, or any other concerning symptom please return to the ED for further evaluation.

## 2023-09-14 NOTE — ED Triage Notes (Signed)
 Pt c/o cough for several months, and N/V for several days.  Chest hurts while coughing.

## 2023-10-15 ENCOUNTER — Encounter: Payer: Self-pay | Admitting: Gastroenterology

## 2023-10-15 ENCOUNTER — Ambulatory Visit (INDEPENDENT_AMBULATORY_CARE_PROVIDER_SITE_OTHER): Admitting: Gastroenterology

## 2023-10-15 VITALS — BP 124/76 | HR 102 | Ht 62.5 in | Wt 198.5 lb

## 2023-10-15 DIAGNOSIS — R053 Chronic cough: Secondary | ICD-10-CM | POA: Diagnosis not present

## 2023-10-15 DIAGNOSIS — K5900 Constipation, unspecified: Secondary | ICD-10-CM | POA: Diagnosis not present

## 2023-10-15 DIAGNOSIS — R151 Fecal smearing: Secondary | ICD-10-CM | POA: Diagnosis not present

## 2023-10-15 DIAGNOSIS — R1312 Dysphagia, oropharyngeal phase: Secondary | ICD-10-CM

## 2023-10-15 MED ORDER — PANTOPRAZOLE SODIUM 40 MG PO TBEC: 40.0000 mg | DELAYED_RELEASE_TABLET | Freq: Every day | ORAL | 3 refills | Status: AC

## 2023-10-15 NOTE — Patient Instructions (Signed)
 Start a fiber supplement such as Benefiber, Citrucel, or  Metamucil.  Take 1 tablespoon once or twice daily can be used to keep bowels regular if needed.   Start Miralax 1 capful daily in 8 ounces of liquid.   We have sent the following medications to your pharmacy for you to pick up at your convenience: Protonix 40 mg once daily.  You have been scheduled for a modified barium swallow on ______ at ______. Please arrive 30 minutes prior to your test for registration. You will go to ________________________ Radiology (1st Floor) for your appointment. Should you need to cancel or reschedule your appointment, please contact [phone] [facility] [place]) or [phone] [facility] [place]). _____________________________________________________________________ A Modified Barium Swallow Study, or MBS, is a special x-ray that is taken to check swallowing skills. It is carried out by a Marine scientist and a Warehouse manager (SLP). During this test, yourmouth, throat, and esophagus, a muscular tube which connects your mouth to your stomach, is checked. The test will help you, your doctor, and the SLP plan what types of foods and liquids are easier for you to swallow. The SLP will also identify positions and ways to help you swallow more easily and safely. What will happen during an MBS? You will be taken to an x-ray room and seated comfortably. You will be asked to swallow small amounts of food and liquid mixed with barium. Barium is a liquid or paste that allows images of your mouth, throat and esophagus to be seen on x-ray. The x-ray captures moving images of the food you are swallowing as it travels from your mouth through your throat and into your esophagus. This test helps identify whether food or liquid is entering your lungs (aspiration). The test also shows which part of your mouth or throat lacks strength or coordination to move the food or liquid in the right direction. This test typically takes 30  minutes to 1 hour to complete. _______________________________________________________________________  Thank you for trusting me with your gastrointestinal care!   Valiant Gaul, [name]  _______________________________________________________  If your blood pressure at your visit was 140/90 or greater, please contact your primary care physician to follow up on this.  _______________________________________________________  If you are age 43 or older, your body mass index should be between 23-30. Your Body mass index is 35.73 kg/m. If this is out of the aforementioned range listed, please consider follow up with your Primary Care Provider.  If you are age 101 or younger, your body mass index should be between 19-25. Your Body mass index is 35.73 kg/m. If this is out of the aformentioned range listed, please consider follow up with your Primary Care Provider.   ________________________________________________________  The [facility] providers would like to encourage you to use MYCHART to communicate with providers for non-urgent requests or questions.  Due to long hold times on the telephone, sending your provider a message by [facility] may be a faster and more efficient way to get a response.  Please allow 48 business hours for a response.  Please remember that this is for non-urgent requests.  _______________________________________________________

## 2023-10-15 NOTE — Progress Notes (Signed)
 Lisa Werner [id number] 03/30/1981   Chief Complaint: Choking, coughing, fecal incontinence  Referring Provider: Artie Laster, FNP Primary GI MD: Para Bold  HPI: Lisa Werner is a 43 y.o. 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    09/14/2023 patient seen in the ED for complaint of cough, nausea, vomiting.  Reported nonproductive cough for 2 months, with vomiting over the last 3 days after persistent coughing.  Reported pain in the center of her chest after coughing.  Some intermittent shortness of breath.  She was noted to have leukocytosis with WBC 18.6, hypokalemia (3.3), pyuria, and bacteruria.  EKG showed sinus tachycardia and was otherwise normal.  CXR showed no acute process. On reassessment patient had no chest pain or shortness of breath and heart rate improved after fluids.  Advised to have pulmonology follow-up for persistent symptoms.  10/04/2023 patient had follow-up with PCP ([facility]).  At that visit patient reported ongoing cough since her stroke, sometimes accompanied by wheezing.  No history of asthma or smoking.  Severe coughing can lead to vomiting.  Tessalon  as prescribed following ED visit was ineffective.  Patient also reported urinary and fecal incontinence.  Patient is scheduled for neurology follow-up of her stroke 10/31/2023.   Today patient reports persistent cough which occurs randomly throughout the day.  Sometimes feels that when she swallows, food or liquid goes down the wrong way and triggers coughing.  Occasionally she has regurgitation, particularly after drinking carbonated beverages.  She denies heartburn or acid reflux.  She reports constipation and has a bowel movement on average every 2 days, often with straining and hard stools.  She was previously taking Senokot as needed but does not  take this anymore.  Now using milk of magnesia as needed.  Only takes this if it has been several days since a bowel movement.  She states she can sometimes have rectal pain with bowel movements which resolves after passage of stool.  She denies any blood in her stool.  Prior to her stroke, bowel movements were more frequent. In the past she has had a small amount of bleeding with passage of large stools.  She reports having fecal incontinence on occasion.  Wears pads for urinary incontinence as well.  Urinary incontinence was a problem prior to her stroke but has worsened, and is triggered by frequent coughing.  Patient denies prior endoscopic procedures.  She declines rectal exam today.  Previous GI Procedures/Imaging      Past Medical History:  Diagnosis Date   Diabetes mellitus without complication (HCC)    Hyperlipidemia    Hypertension     No past surgical history on file.  Current Outpatient Medications  Medication Sig Dispense Refill   acetaminophen  (TYLENOL ) 325 MG tablet Take 1-2 tablets (325-650 mg total) by mouth every 4 (four) hours as needed for mild pain.     AgaMatrix Ultra-Thin Lancets MISC Check blood glucose before meals 3 times daily. 100 each 11   albuterol (VENTOLIN HFA) 108 (90 Base) MCG/ACT inhaler Inhale 2 puffs into the lungs every 6 (six) hours as needed for wheezing.     aspirin  EC 81 MG tablet Take 1 tablet (81 mg total) by mouth daily. Swallow whole. 120 tablet 0   benzonatate  (TESSALON ) 100 MG capsule Take 1 capsule (100 mg total) by mouth 3 (three) times daily as needed for cough. 21 capsule 0  Blood Glucose Monitoring Suppl (AGAMATRIX PRESTO) w/Device KIT Check blood glucose 3 times daily before meals 1 kit 0   cetirizine -pseudoephedrine  (ZYRTEC -D) 5-120 MG tablet Take 1 tablet by mouth daily. 30 tablet 0   glucose blood (AGAMATRIX PRESTO TEST) test strip Check blood glucose 3 times daily before meals 100 each 12   insulin  aspart (NOVOLOG  FLEXPEN)  100 UNIT/ML FlexPen Inject 18 Units into the skin 3 (three) times daily with meals. 15 mL 0   Insulin  Pen Needle (BD PEN NEEDLE NANO U/F) 32G X 4 MM MISC Use 4 (four) times daily. 100 each 0   magnesium  oxide (MAG-OX) 400 (240 Mg) MG tablet Take 1 tablet (400 mg total) by mouth daily. 120 tablet 0   melatonin 3 MG TABS tablet Take 1 tablet (3 mg total) by mouth at bedtime as needed (insomnia). 60 tablet 0   ondansetron  (ZOFRAN ) 4 MG tablet Take 1 tablet (4 mg total) by mouth every 6 (six) hours. 12 tablet 0   rosuvastatin  (CRESTOR ) 40 MG tablet Take 1 tablet (40 mg total) by mouth at bedtime. 30 tablet 0   senna-docusate (SENOKOT-S) 8.6-50 MG tablet Take 2 tablets by mouth at bedtime. 120 tablet 0   TRESIBA  FLEXTOUCH 100 UNIT/ML FlexTouch Pen Inject 33 Units into the skin daily AND 23 Units at bedtime. 15 mL 1   No current facility-administered medications for this visit.    Allergies as of 10/15/2023 - Review Complete 09/14/2023  Allergen Reaction Noted   Pollen extract  01/09/2020    Family History  Problem Relation Age of Onset   Arthritis Mother    Kidney disease Mother    Asthma Father    Diabetes Maternal Grandmother    Breast cancer Maternal Grandmother    Breast cancer Paternal Grandmother    Stroke Paternal Grandmother     Social History   Tobacco Use   Smoking status: Never   Smokeless tobacco: Never  Vaping Use   Vaping status: Never Used  Substance Use Topics   Alcohol use: No   Drug use: No     Review of Systems:    Constitutional: No fever, chills, weakness or fatigue Skin: No rash or itching Cardiovascular: No chest pain, chest pressure or palpitations   Respiratory: Positive for cough Gastrointestinal: See HPI and otherwise negative Genitourinary: Positive for urinary incontinence Hematologic: No bleeding or bruising    Physical Exam:  Vital signs: BP 124/76   Pulse (!) 102   Ht 5' 2.5 (1.588 m)   Wt 198 lb 8 oz (90 kg)   SpO2 94%   BMI 35.73  kg/m    Constitutional: NAD, obese, alert and cooperative Head:  Normocephalic and atraumatic.  Eyes: No scleral icterus. Conjunctiva pink. Mouth: No oral lesions. Respiratory: Respirations even and unlabored. Lungs clear to auscultation bilaterally.  No wheezes, crackles, or rhonchi.  Cardiovascular:  Regular rate and rhythm. No murmurs. No peripheral edema. Gastrointestinal:  Soft, nondistended, nontender. No rebound or guarding. Normal bowel sounds. No appreciable masses or hepatomegaly. Rectal:  Not performed. Patient declined. Neurologic:  Alert and oriented x4;  grossly normal neurologically.  Skin:   Dry and intact without significant lesions or rashes. Psychiatric: Oriented to person, place and time. Demonstrates good judgement and reason without abnormal affect or behaviors.   RELEVANT LABS AND IMAGING: CBC    Component Value Date/Time   WBC 18.6 (H) 09/14/2023 0847   RBC 4.31 09/14/2023 0847   HGB 12.0 09/14/2023 0847   HGB 13.0 11/19/2018  1136   HCT 38.5 09/14/2023 0847   HCT 42.7 11/19/2018 1136   PLT 373 09/14/2023 0847   PLT 328 11/19/2018 1136   MCV 89.3 09/14/2023 0847   MCV 93 11/19/2018 1136   MCH 27.8 09/14/2023 0847   MCHC 31.2 09/14/2023 0847   RDW 14.3 09/14/2023 0847   RDW 13.2 11/19/2018 1136   LYMPHSABS 2.6 12/28/2022 0726   LYMPHSABS 3.3 (H) 11/19/2018 1136   MONOABS 0.8 12/28/2022 0726   EOSABS 0.6 (H) 12/28/2022 0726   EOSABS 0.4 11/19/2018 1136   BASOSABS 0.1 12/28/2022 0726   BASOSABS 0.1 11/19/2018 1136    CMP     Component Value Date/Time   NA 137 09/14/2023 0847   NA 132 (L) 03/04/2019 1834   K 3.3 (L) 09/14/2023 0847   CL 100 09/14/2023 0847   CO2 25 09/14/2023 0847   GLUCOSE 261 (H) 09/14/2023 0847   BUN 10 09/14/2023 0847   BUN 13 03/04/2019 1834   CREATININE 1.10 (H) 09/14/2023 0847   CALCIUM  9.5 09/14/2023 0847   PROT 8.3 (H) 09/14/2023 0847   PROT 7.1 11/19/2018 1136   ALBUMIN 3.7 09/14/2023 0847   ALBUMIN 4.0  11/19/2018 1136   AST 23 09/14/2023 0847   ALT 22 09/14/2023 0847   ALKPHOS 107 09/14/2023 0847   BILITOT 0.6 09/14/2023 0847   BILITOT 0.2 11/19/2018 1136   GFRNONAA >60 09/14/2023 0847   GFRAA 102 03/04/2019 1834   Echocardiogram 12/20/2022 1. Left ventricular ejection fraction, by estimation, is 65 to 70% . The left ventricle has normal function. The left ventricle has no regional wall motion abnormalities. There is moderate left ventricular hypertrophy. Left ventricular diastolic parameters were normal. The average left ventricular global longitudinal strain is - 22. 0 % . The global longitudinal strain is normal.  2. Right ventricular systolic function is normal. The right ventricular size is normal.  3. The mitral valve is normal in structure. Trivial mitral valve regurgitation. No evidence of mitral stenosis.  4. The aortic valve is tricuspid. Aortic valve regurgitation is not visualized. No aortic stenosis is present.  5. Agitated saline contrast bubble study was negative, with no evidence of any interatrial shunt.  Assessment/Plan:   Persistent cough Oropharyngeal dysphagia Patient reporting persistent cough since her stroke 11/2022.  Cough can be triggered by eating or drinking, and she feels like food and drink sometimes goes down the wrong way.  She reports occasional regurgitation but denies any heartburn or acid reflux.  - Order modified barium swallow for further evaluation of dysphagia and cough - Start trial of Protonix 40mg  daily  - If no improvement at follow up consider referral to ENT  Fecal smearing Constipation Patient reports constipation, with hard stools and straining.  Having a bowel movement typically every 2 days.  Taking milk of magnesia as needed.  Has some rectal pain with bowel movements that resolves after passage of stool.  She is not seeing any blood in her stool or on the toilet paper.  Also having some fecal incontinence, with leakage of stool.  Patient  declined rectal exam.  - Recommend starting a fiber supplement such as Metamucil or Benefiber - Start MiraLAX 1 capful daily  - Consider referral to pelvic floor physical therapy - Consider colonoscopy in future    Valiant Gaul, [name] [facility] 10/15/2023, 9:14 AM  Patient Care Team: Patient, No Pcp Per as PCP - General (General Practice)

## 2023-10-18 ENCOUNTER — Telehealth: Payer: Self-pay | Admitting: Gastroenterology

## 2023-10-18 NOTE — Progress Notes (Signed)
 ____________________________________________________________  Attending physician addendum:  Thank you for sending this case to me. I have reviewed the entire note and agree with the plan.  Agree with MBS given the symptoms and prior CVA.  Subsequent ENT consult also reasonable since vocal cord dysfunction and other pharyngeal functional issues should be assessed.  (ENT is likely to attribute this to reflux however)  she should also return to primary care for consideration of a pulmonary consult in the patient with a persistent cough  Lorella Roles, MD  ____________________________________________________________

## 2023-10-18 NOTE — Telephone Encounter (Signed)
 Please contact patient and advise her to follow up with PCP to discuss placing a referral to pulmonology for evaluation of her chronic cough. Thank you

## 2023-10-18 NOTE — Telephone Encounter (Signed)
 Attempted to reach pt by phone line rings no answer- no voice mail set up

## 2023-10-31 ENCOUNTER — Encounter: Payer: Self-pay | Admitting: *Deleted

## 2023-10-31 ENCOUNTER — Encounter: Payer: Self-pay | Admitting: Adult Health

## 2023-10-31 ENCOUNTER — Ambulatory Visit: Payer: BC Managed Care – PPO | Admitting: Adult Health

## 2023-10-31 VITALS — BP 115/82 | HR 96

## 2023-10-31 DIAGNOSIS — I639 Cerebral infarction, unspecified: Secondary | ICD-10-CM

## 2023-10-31 NOTE — Patient Instructions (Addendum)
 Your Plan:  Continue ASA  Blood pressure goal <130/90 Cholesterol LDL goal <70 Diabetes goal A1c <7 Monitor diet and try to exercise  Discussed imaging with Dr. Onita one of our neurologist here.  We do not need to repeat imaging at this time  Thank you for coming to see us  at [facility]. I hope we have been able to provide you high quality care today.  You may receive a patient satisfaction survey over the next few weeks. We would appreciate your feedback and comments so that we may continue to improve ourselves and the health of our patients.

## 2023-10-31 NOTE — Progress Notes (Signed)
 PATIENT: Bryanah Sidell DOB: 1981-02-22  REASON FOR VISIT: follow up HISTORY FROM: patient PRIMARY NEUROLOGIST: Dr. Rosemarie  Chief Complaint  Patient presents with   Follow-up    Rm 19, alone.  Doing ok other then a cough/incontinence seen pcp.  Pt is driving.  I asked about driving program,  she said she got handicap placard???       HISTORY OF PRESENT ILLNESS: Today 10/31/23:  Corona Popovich is a 43 y.o. female with a history of pontine stroke. Returns today for follow-up.  Denies any additional strokelike symptoms.  Reports that right sided weakness has improved.  She notices that she feels weaker later in the day.  She remains on aspirin  but does admit that she has not been taking it consistently.  Remains on Crestor  for her cholesterol.  Reports that her latest hemoglobin A1c was 12.  She is on insulin .  PCP is managing stroke risk factors.  Reports that she has been having incontinence of bowel and bladder.  For that reason she has not returned to work.  Her PCP has referred her to GI whom she saw last month for fecal incontinence.  Patient is not sure that urinary continence has been addressed.  Patient does state that GI advised her to take MiraLAX however she states that she cannot afford to buy this medicine over-the-counter right now.  Reports that she is trying to get short-term disability through her PCP.   03/06/23: Zahli Vetsch is a 43 y.o. female here for hospital follow-up due to stroke.     The patient, a 43 year old with a history of diabetes and recent pontine stroke, presents for follow-up. She reports persistent right-sided weakness despite ongoing physical therapy. She denies any other deficits from the stroke. Her blood sugars have been running below 200, which she attributes to dietary changes. She is currently on Crestor  40mg  for elevated LDL cholesterol levels, which she is compliant with. She was discharged on aspirin  and Plavix  for  three weeks post-stroke, but is now only on aspirin . She denies any mood changes or feelings of depression or anxiety, but expresses eagerness to return to her normal life and work as a Clinical cytogeneticist.     Discussed the use of AI scribe software for clinical note transcription with the patient, who gave verbal consent to proceed.  HISTORY Ms. Ladiamond Gallina Magar is a 43 y.o. female with history of hypertension, hyperlipidemia and diabetes presenting with right-sided weakness since Saturday 8/17.  She was found to be hypoglycemic with capillary blood glucose of 536 on admission.  Patient states that she does not check her blood sugar at home and stopped wearing her glucose monitor because it beeped too much.  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  While she does have several apparent risk factors for stroke, considering her age, she will need workup of stroke in young patient.   Acute Ischemic Infarct:  left pontine stroke, subacute, likely small vessel disease  CT head No acute abnormality.  Atherosclerotic dolichoectasia of the basilar artery CTA head & neck areas of caliber change in proximal left V2 segment, concerning for dissection vs. Hypoplastic. no intracranial LVO, mild stenosis in distal right supraclinoid ICA MRI acute left pontine infarct MRA LVO or significant stenosis, 1 to 2 mm laterally directed outpouching from proximal right A2, which may represent a tiny aneurysm versus an infundibulum.  2D Echo EF 65 to 70%, no atrial level shunt,  left atrium normal in size LDL 180 HgbA1c pending UDS neg VTE prophylaxis -SCDs No antithrombotic prior to admission, now on aspirin  81 mg daily and clopidogrel  75 mg daily for 3 weeks and then aspirin  alone. Therapy recommendations:  CIR Disposition: Pending  REVIEW OF SYSTEMS: Out of a complete 14 system review of symptoms, the patient complains only of the following symptoms, and all other reviewed systems  are negative.  ALLERGIES: Allergies  Allergen Reactions   Pollen Extract     HOME MEDICATIONS: Outpatient Medications Prior to Visit  Medication Sig Dispense Refill   acetaminophen  (TYLENOL ) 325 MG tablet Take 1-2 tablets (325-650 mg total) by mouth every 4 (four) hours as needed for mild pain.     cetirizine -pseudoephedrine  (ZYRTEC -D) 5-120 MG tablet Take 1 tablet by mouth daily. (Patient taking differently: Take 1 tablet by mouth daily. Prn) 30 tablet 0   glucose blood (AGAMATRIX PRESTO TEST) test strip Check blood glucose 3 times daily before meals 100 each 12   insulin  aspart (NOVOLOG  FLEXPEN) 100 UNIT/ML FlexPen Inject 18 Units into the skin 3 (three) times daily with meals. 15 mL 0   Insulin  Pen Needle (BD PEN NEEDLE NANO U/F) 32G X 4 MM MISC Use 4 (four) times daily. 100 each 0   rosuvastatin  (CRESTOR ) 40 MG tablet Take 1 tablet (40 mg total) by mouth at bedtime. 30 tablet 0   TRESIBA  FLEXTOUCH 100 UNIT/ML FlexTouch Pen Inject 33 Units into the skin daily AND 23 Units at bedtime. 15 mL 1   AgaMatrix Ultra-Thin Lancets MISC Check blood glucose before meals 3 times daily. 100 each 11   albuterol (VENTOLIN HFA) 108 (90 Base) MCG/ACT inhaler Inhale 2 puffs into the lungs every 6 (six) hours as needed for wheezing. (Patient not taking: Reported on 10/31/2023)     aspirin  EC 81 MG tablet Take 1 tablet (81 mg total) by mouth daily. Swallow whole. (Patient not taking: Reported on 10/31/2023) 120 tablet 0   benzonatate  (TESSALON ) 100 MG capsule Take 1 capsule (100 mg total) by mouth 3 (three) times daily as needed for cough. (Patient not taking: Reported on 10/31/2023) 21 capsule 0   Blood Glucose Monitoring Suppl (AGAMATRIX PRESTO) w/Device KIT Check blood glucose 3 times daily before meals 1 kit 0   magnesium  oxide (MAG-OX) 400 (240 Mg) MG tablet Take 1 tablet (400 mg total) by mouth daily. (Patient not taking: Reported on 10/31/2023) 120 tablet 0   melatonin 3 MG TABS tablet Take 1 tablet (3 mg  total) by mouth at bedtime as needed (insomnia). (Patient not taking: Reported on 10/31/2023) 60 tablet 0   ondansetron  (ZOFRAN ) 4 MG tablet Take 1 tablet (4 mg total) by mouth every 6 (six) hours. (Patient not taking: Reported on 10/31/2023) 12 tablet 0   pantoprazole  (PROTONIX ) 40 MG tablet Take 1 tablet (40 mg total) by mouth daily. (Patient not taking: Reported on 10/31/2023) 30 tablet 3   senna-docusate (SENOKOT-S) 8.6-50 MG tablet Take 2 tablets by mouth at bedtime. (Patient not taking: Reported on 10/31/2023) 120 tablet 0   No facility-administered medications prior to visit.    PAST MEDICAL HISTORY: Past Medical History:  Diagnosis Date   Diabetes mellitus without complication (HCC)    Hyperlipidemia    Hypertension     PAST SURGICAL HISTORY: History reviewed. No pertinent surgical history.  FAMILY HISTORY: Family History  Problem Relation Age of Onset   Arthritis Mother    Kidney disease Mother    Asthma Father  Diabetes Maternal Grandmother    Breast cancer Maternal Grandmother    Breast cancer Paternal Grandmother    Stroke Paternal Grandmother     SOCIAL HISTORY: Social History   Socioeconomic History   Marital status: Single    Spouse name: Not on file   Number of children: Not on file   Years of education: Not on file   Highest education level: Not on file  Occupational History   Not on file  Tobacco Use   Smoking status: Never   Smokeless tobacco: Never  Vaping Use   Vaping status: Never Used  Substance and Sexual Activity   Alcohol use: No   Drug use: No   Sexual activity: Never  Other Topics Concern   Not on file  Social History Narrative   Not on file   Social Drivers of Health   Financial Resource Strain: Low Risk  (10/04/2023)   Received from [facility]   Overall Financial Resource Strain (CARDIA)    Difficulty of Paying Living Expenses: Not hard at all  Food Insecurity: No Food Insecurity (10/04/2023)   Received from [facility]   Hunger  Vital Sign    Within the past 12 months, you worried that your food would run out before you got the money to buy more.: Never true    Within the past 12 months, the food you bought just didn't last and you didn't have money to get more.: Never true  Recent Concern: Food Insecurity - Food Insecurity Present (09/04/2023)   Received from [facility]   Hunger Vital Sign    Worried About Running Out of Food in the Last Year: Sometimes true    Ran Out of Food in the Last Year: Sometimes true  Transportation Needs: No Transportation Needs (10/04/2023)   Received from [facility]   PRAPARE - Transportation    Lack of Transportation (Medical): No    Lack of Transportation (Non-Medical): No  Physical Activity: Inactive (10/04/2023)   Received from [facility]   Exercise Vital Sign    On average, how many days per week do you engage in moderate to strenuous exercise (like a brisk walk)?: 0 days    On average, how many minutes do you engage in exercise at this level?: 60 min  Stress: No Stress Concern Present (10/04/2023)   Received from [facility] - Occupational Stress Questionnaire    Feeling of Stress : Not at all  Social Connections: Socially Integrated (10/04/2023)   Received from [facility]   Social Network    How would you rate your social network (family, work, friends)?: Good participation with social networks  Intimate Partner Violence: Not At Risk (10/04/2023)   Received from [facility]   HITS    Over the last 12 months how often did your partner physically hurt you?: Never    Over the last 12 months how often did your partner insult you or talk down to you?: Never    Over the last 12 months how often did your partner threaten you with physical harm?: Never    Over the last 12 months how often did your partner scream or curse at you?: Never      PHYSICAL EXAM  Vitals:   10/31/23 1339  BP: 115/82  Pulse: 96     Generalized:  Well developed, in no acute distress   Neurological examination  Mentation: Alert oriented to time, place, history taking. Follows all commands speech  and language fluent Cranial nerve II-XII: Pupils were equal round reactive to light. Extraocular movements were full, visual field were full on confrontational test. Facial sensation and strength were normal. Uvula tongue midline. Head turning and shoulder shrug  were normal and symmetric. Motor: Good strength throughout mildly weaker on the right side. Good symmetric motor tone is noted throughout.  Sensory: Sensory testing is intact to soft touch on all 4 extremities. No evidence of extinction is noted.  Coordination: Cerebellar testing reveals good finger-nose-finger and heel-to-shin bilaterally.    DIAGNOSTIC DATA (LABS, IMAGING, TESTING) - I reviewed patient records, labs, notes, testing and imaging myself where available.  Lab Results  Component Value Date   WBC 18.6 (H) 09/14/2023   HGB 12.0 09/14/2023   HCT 38.5 09/14/2023   MCV 89.3 09/14/2023   PLT 373 09/14/2023      Component Value Date/Time   NA 137 09/14/2023 0847   NA 132 (L) 03/04/2019 1834   K 3.3 (L) 09/14/2023 0847   CL 100 09/14/2023 0847   CO2 25 09/14/2023 0847   GLUCOSE 261 (H) 09/14/2023 0847   BUN 10 09/14/2023 0847   BUN 13 03/04/2019 1834   CREATININE 1.10 (H) 09/14/2023 0847   CALCIUM  9.5 09/14/2023 0847   PROT 8.3 (H) 09/14/2023 0847   PROT 7.1 11/19/2018 1136   ALBUMIN 3.7 09/14/2023 0847   ALBUMIN 4.0 11/19/2018 1136   AST 23 09/14/2023 0847   ALT 22 09/14/2023 0847   ALKPHOS 107 09/14/2023 0847   BILITOT 0.6 09/14/2023 0847   BILITOT 0.2 11/19/2018 1136   GFRNONAA >60 09/14/2023 0847   GFRAA 102 03/04/2019 1834   Lab Results  Component Value Date   CHOL 265 (H) 12/20/2022   HDL 36 (L) 12/20/2022   LDLCALC 180 (H) 12/20/2022   TRIG 245 (H) 12/20/2022   CHOLHDL 7.4 12/20/2022   Lab Results  Component Value Date   HGBA1C >15.5 (H)  12/20/2022   No results found for: VITAMINB12 Lab Results  Component Value Date   TSH 1.66 05/31/2017      ASSESSMENT AND PLAN 43 y.o. year old female  has a past medical history of Diabetes mellitus without complication (HCC), Hyperlipidemia, and Hypertension. here with:  Acute Ischemic Infarct:  left pontine stroke, subacute, likely small vessel disease    Continue aspirin  81 mg daily  for secondary stroke prevention.  Did advise the patient that she should take this consistently.  Discussed secondary stroke prevention measures and importance of close PCP follow up for aggressive stroke risk factor management. I have gone over the pathophysiology of stroke, warning signs and symptoms, risk factors and their management in some detail with instructions to go to the closest emergency room for symptoms of concern. HTN: BP goal <130/90.   HLD: LDL goal <70.  Managed by PCP DMII: A1c goal<7.0.  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 Was not on the CTA impressiion. Discussed with Dr. Onita and advised we do not need to repeat imaging. FU with our office PRN   Duwaine Russell, MSN, NP-C 10/31/2023, 3:05 PM [facility] [address] [place], [place] [address] [phone]

## 2023-11-04 NOTE — Telephone Encounter (Signed)
 Spoke with patient and relayed message from Megan NP. She verbalized understanding. She will call if needed sooner than her next scheduled appt for 11/05/24.

## 2023-11-20 ENCOUNTER — Ambulatory Visit: Attending: Registered Nurse | Admitting: Speech Pathology

## 2023-11-20 ENCOUNTER — Encounter: Payer: Self-pay | Admitting: Speech Pathology

## 2023-11-20 DIAGNOSIS — R1310 Dysphagia, unspecified: Secondary | ICD-10-CM | POA: Diagnosis present

## 2023-11-20 DIAGNOSIS — R498 Other voice and resonance disorders: Secondary | ICD-10-CM | POA: Diagnosis present

## 2023-11-20 NOTE — Therapy (Unsigned)
 OUTPATIENT SPEECH LANGUAGE PATHOLOGY SWALLOW EVALUATION   Patient Name: Lisa Werner MRN: [id number] DOB:[birth year], 43 y.o., female Today's Date: 11/21/2023  PCP: None per EMR REFERRING PROVIDER: Mavis Redge SAILOR, FNP  END OF SESSION:    Past Medical History:  Diagnosis Date   Diabetes mellitus without complication (HCC)    Hyperlipidemia    Hypertension    History reviewed. No pertinent surgical history. 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   Left pontine stroke (HCC) 12/24/2022   CVA (cerebral vascular accident) (HCC) 12/20/2022   Acute ischemic stroke (HCC) 12/19/2022   Leukocytosis 12/19/2022   Acute right-sided weakness 12/19/2022   Essential hypertension 02/21/2019   Noncompliance with diabetes treatment 07/17/2017   Mixed hyperlipidemia 06/03/2017   Pain of left eye 06/03/2017   Urinary frequency 06/03/2017   DM2 (diabetes mellitus, type 2) (HCC) 09/28/2013    ONSET DATE: referred on 10/08/23  REFERRING DIAG:  I63.9 (ICD-10-CM) - Left pontine stroke (HCC)  T17.308A (ICD-10-CM) - Choking, initial encounter    THERAPY DIAG:  Dysphagia, unspecified type  Rationale for Evaluation and Treatment: Rehabilitation  SUBJECTIVE:   SUBJECTIVE STATEMENT: Pt was pleasant and cooperative throughout evaluation.   Pt accompanied by: self  PERTINENT HISTORY: L pontine CVA  PAIN:  Are you having pain? No  FALLS: Has patient fallen in last 6 months?  No  LIVING ENVIRONMENT: Lives with: lives alone Lives in: House/apartment  PLOF:  Level of assistance: Independent with ADLs, Independent with IADLs Employment: Full-time employment; Real estate agent   PATIENT GOALS: reduce cough  OBJECTIVE:  Note: Objective measures were completed at Evaluation  unless otherwise noted. OBJECTIVE:   DIAGNOSTIC FINDINGS: Per EMR  MR BRAIN WO CONTRASTMPRESSION: 1. Acute infarct in the left pons. 2. No intracranial large vessel occlusion or significant stenosis. 3. 1-2 mm laterally directed outpouching from the proximal right A2, which may represent a tiny aneurysm versus an infundibulum.   These results were called by telephone at the time of interpretation on 12/19/2022 at 8:25 pm to provider [facility], who verbally acknowledged these results.     Electronically Signed   By: Donald Campion M.D.   On: 12/19/2022 20:25    INSTRUMENTAL SWALLOW STUDY FINDINGS :  NA; has not received  COGNITION: Overall cognitive status: Within functional limits for tasks assessed Areas of impairment:  NA Functional deficits: NA  SUBJECTIVE DYSPHAGIA REPORTS:  Date of onset: 05/2023; started having more trouble  -Constant coughing at night and had to go to ED ~2 months ago  Reported symptoms: coughing with both solids and liquids; choked on saliva; throat irritation that makes her feel like she needs to cough Current diet: regular and thin liquids; still not drinking from straws as recommended at bedside in 2024. Pt reported she was having no difficulty with swallowing until January.   Co-morbid voice changes: Yes; during coughing event   FACTORS WHICH MAY INCREASE RISK OF ADVERSE EVENT IN PRESENCE OF ASPIRATION:  General health: well appearing  Risk factors: none evident     ORAL MOTOR EXAMINATION: Overall status: WFL Comments: NA  CLINICAL SWALLOW ASSESSMENT:   Dentition: adequate natural dentition Vocal quality at baseline: normal Patient directly observed with POs: Yes: thin liquids  Feeding: able to feed self Liquids provided by: cup Alanna Hila Protocol: Pass Oral phase  signs and symptoms: NA Pharyngeal phase signs and symptoms: None noted; very delayed cough (though unable to determine if this is related to drinking).   Happening a couple  times a day where she is getting strangled. This is happening mostly with liquids.   PATIENT REPORTED OUTCOME MEASURES (PROM): EAT-10: 5/40  [place] Laryngeal Hypersensitivity Questionnaire: 14.1 (~17.1+ considered WNL)   - Throat Obstruction: 5.2   -Throat Pain/Thermal: 5.3 - Throat Tickle: 3.6 (most bothersome)                                                                                                                            TREATMENT DATE:     PATIENT EDUCATION: Education details: Chronic cough, dysphagia, SLP role Person educated: Patient Education method: Explanation Education comprehension: verbalized understanding   ASSESSMENT:  CLINICAL IMPRESSION: Pt is a 43 yo female who presents to ST OP for evaluation with cough. Pt endorses coughing with liquids, foods, and at random times throughout the day - sometimes to hard that she vomits.  Pt reports she often wakes up the feeling she needs to cough or clear throat, then can go back to sleep. Also endorses re: laughing can cause coughing and feeling embarrassed. Pt was assessed with Alanna Hila Protocol - she passed with no overt s/sx of aspiration. Due to coughing not strictly related to swallowing, SLP administered the [facility] Laryngeal Sensitivity Questionnaire, along with EAT-10. SLP suspects pt is suffering from chronic cough with unknown origin. Pt is scheduled for an esophagram to r/o esophageal impairments. SLP is recommended a referral to ENT to visualize laryngeal area and identify any obvious causes of cough. To consider MBS after ENT and esophogram, or with worsening sx. Pt was observed to cough x5 in today's session. No immediate coughs following liquids were observed. SLP rec skilled ST services targeting safe swallow strategies and chronic cough strategies.      OBJECTIVE IMPAIRMENTS: include voice disorder and dysphagia. These impairments are limiting patient from safety when swallowing and impacting  QOL. Factors affecting potential to achieve goals and functional outcome are NA. Patient will benefit from skilled SLP services to address above impairments and improve overall function.  REHAB POTENTIAL: Good   GOALS: Goals reviewed with patient? Yes  SHORT TERM GOALS: Target date: 12/22/23  Pt will use trained throat clear alternatives to decr cough to 3, in 2 sessions Baseline: 11/15/23 Goal status: INITIAL   Pt will demo abdominal breathing in sentence responses 80% of the time, in two sessions Baseline:  Goal status: INITIAL  3.  Pt will report a reduction in behaviors that contribute to acid reflux, as evidenced by decreased symptoms. Baseline:  Goal status: INITIAL    LONG TERM GOALS: Target date: 01/22/24  Pt will improve PROM/s compared to initial administration Baseline:  Goal status: INITIAL    Pt will use trained chronic cough alternatives to decr cough to 2, in 2 sessions Baseline:  Goal status: INITIAL  Pt will demo abdominal breathing in simple-mod complex conversation 80% of the time, in two sessions Baseline:  Goal status: INITIAL   PLAN:  SLP FREQUENCY: 1x/week  SLP DURATION: 8 weeks  PLANNED INTERVENTIONS: Aspiration precaution training, Diet toleration management , Environmental controls, Cueing hierachy, SLP instruction and feedback, Compensatory strategies, Patient/family education, [phone] Treatment of speech (30 or 45 min) , and [id number] Treatment of swallowing function    [employer], CCC-SLP 11/21/2023, 8:44 PM

## 2023-12-04 ENCOUNTER — Encounter: Payer: Self-pay | Admitting: Gastroenterology

## 2023-12-04 ENCOUNTER — Ambulatory Visit (INDEPENDENT_AMBULATORY_CARE_PROVIDER_SITE_OTHER): Admitting: Gastroenterology

## 2023-12-04 VITALS — BP 100/78 | HR 112 | Ht 63.0 in | Wt 200.4 lb

## 2023-12-04 DIAGNOSIS — R053 Chronic cough: Secondary | ICD-10-CM

## 2023-12-04 DIAGNOSIS — R151 Fecal smearing: Secondary | ICD-10-CM

## 2023-12-04 DIAGNOSIS — R1312 Dysphagia, oropharyngeal phase: Secondary | ICD-10-CM

## 2023-12-04 DIAGNOSIS — K5900 Constipation, unspecified: Secondary | ICD-10-CM | POA: Diagnosis not present

## 2023-12-04 DIAGNOSIS — R159 Full incontinence of feces: Secondary | ICD-10-CM

## 2023-12-04 DIAGNOSIS — K6289 Other specified diseases of anus and rectum: Secondary | ICD-10-CM

## 2023-12-04 DIAGNOSIS — K649 Unspecified hemorrhoids: Secondary | ICD-10-CM | POA: Diagnosis not present

## 2023-12-04 NOTE — Progress Notes (Signed)
 Lisa Werner [id number] [birth year]   Chief Complaint: Constipation, cough  Referring Provider: No ref. provider found Primary GI MD: Dr. Legrand  HPI: Lisa Werner is a 43 y.o. 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     09/14/2023 patient seen in the ED for complaint of cough, nausea, vomiting.  Reported nonproductive cough for 2 months, with vomiting over the last 3 days after persistent coughing.  Reported pain in the center of her chest after coughing.  Some intermittent shortness of breath.  She was noted to have leukocytosis with WBC 18.6, hypokalemia (3.3), pyuria, and bacteruria.  EKG showed sinus tachycardia and was otherwise normal.  CXR showed no acute process. On reassessment patient had no chest pain or shortness of breath and heart rate improved after fluids.  Advised to have pulmonology follow-up for persistent symptoms.   10/04/2023 patient had follow-up with PCP ([facility]).  At that visit patient reported ongoing cough since her stroke, sometimes accompanied by wheezing.  No history of asthma or smoking.  Severe coughing can lead to vomiting.  Tessalon  as prescribed following ED visit was ineffective.  Patient also reported urinary and fecal incontinence.   Patient was scheduled for neurology follow-up of her stroke 10/31/2023.  Initially seen in our office 10/15/2023, at which time patient reported a persistent cough since her stroke 11/2022.  Can be triggered by eating or drinking.  She had occasional regurgitation but denied any heartburn or acid reflux.  Also reported some constipation and fecal smearing.  Declined rectal exam.  Denies seeing any blood or melena. She was started on Protonix  40 mg daily.  Barium swallow was ordered but has not been completed.  She did have a modified barium swallow with outpatient  rehab.  She had no overt signs and symptoms of aspiration. ENT referral was recommended.  Seen in follow-up by PCP 11/04/2023.  Noted to be experiencing episodes of low blood sugar.  Per note, she has not started taking MiraLAX, fiber, or Protonix  due to financial constraints.  Has not yet consulted with pulmonology.   Today patient states that her constipation has improved on MiraLAX.  She was taking it daily, but had to go back to every other day due to runny stools.  Having a bowel movement daily but sometimes with a sensation of incomplete emptying.  She has had a couple episodes of fecal incontinence when she mistakes fecal urgency for gas.  States stools are soft and loose but not watery when this occurs.  She has fecal urgency as well as urinary urgency.  Denies history of pelvic floor physical therapy.  She continues to feel that she gets choked on food and liquid and it triggers coughing.  Seems to be worse with spicy foods and peanuts.  Denies anything getting stuck in her throat or chest.  She reports frequent throat irritation which triggers coughing.  Not as bad as before but still occurring.  Has not had barium swallow done.   Denies any acid reflux or heartburn.  Taking Protonix  but has not noticed improvement in her symptoms on the medication.  States that she does not have an appointment yet with ENT but has been referred.  Has not seen pulmonology and does not have a referral.  Reports that when she was hospitalized for her stroke, there was a leak in her apartment building and her downstairs  neighbor was found to have mold in her apartment.  Patient's apartment was not evaluated, but she wonders if this may be triggering her cough.  She has been taking Protonix  but denies improvement in symptoms on the medication.  Previous GI Procedures/Imaging      Past Medical History:  Diagnosis Date   Diabetes mellitus without complication (HCC)    Hyperlipidemia    Hypertension      History reviewed. No pertinent surgical history.  Current Outpatient Medications  Medication Sig Dispense Refill   acetaminophen  (TYLENOL ) 325 MG tablet Take 1-2 tablets (325-650 mg total) by mouth every 4 (four) hours as needed for mild pain.     AgaMatrix Ultra-Thin Lancets MISC Check blood glucose before meals 3 times daily. 100 each 11   aspirin  EC 81 MG tablet Take 1 tablet (81 mg total) by mouth daily. Swallow whole. 120 tablet 0   Blood Glucose Monitoring Suppl (AGAMATRIX PRESTO) w/Device KIT Check blood glucose 3 times daily before meals 1 kit 0   enalapril (VASOTEC) 2.5 MG tablet Take 2.5 mg by mouth 2 (two) times daily.     glucose blood (AGAMATRIX PRESTO TEST) test strip Check blood glucose 3 times daily before meals 100 each 12   hydrochlorothiazide (HYDRODIURIL) 12.5 MG tablet Take 12.5 mg by mouth every morning.     insulin  aspart (NOVOLOG  FLEXPEN) 100 UNIT/ML FlexPen Inject 18 Units into the skin 3 (three) times daily with meals. 15 mL 0   Insulin  Pen Needle (BD PEN NEEDLE NANO U/F) 32G X 4 MM MISC Use 4 (four) times daily. 100 each 0   melatonin 3 MG TABS tablet Take 1 tablet (3 mg total) by mouth at bedtime as needed (insomnia). 60 tablet 0   METAMUCIL FIBER PO Take 1 Dose by mouth daily.     pantoprazole  (PROTONIX ) 40 MG tablet Take 1 tablet (40 mg total) by mouth daily. 30 tablet 3   rosuvastatin  (CRESTOR ) 40 MG tablet Take 1 tablet (40 mg total) by mouth at bedtime. 30 tablet 0   TRESIBA  FLEXTOUCH 100 UNIT/ML FlexTouch Pen Inject 33 Units into the skin daily AND 23 Units at bedtime. (Patient taking differently: 48 units in the mornings) 15 mL 1   No current facility-administered medications for this visit.    Allergies as of 12/04/2023 - Review Complete 12/04/2023  Allergen Reaction Noted   Pollen extract  01/09/2020    Family History  Problem Relation Age of Onset   Arthritis Mother    Kidney disease Mother    Asthma Father    Diabetes Maternal Grandmother     Breast cancer Maternal Grandmother    Breast cancer Paternal Grandmother    Stroke Paternal Grandmother     Social History   Tobacco Use   Smoking status: Never   Smokeless tobacco: Never  Vaping Use   Vaping status: Never Used  Substance Use Topics   Alcohol use: No   Drug use: No     Review of Systems:    Constitutional: No unintentional weight loss, fever, chills Cardiovascular: No chest pain   Respiratory: Positive cough Gastrointestinal: See HPI and otherwise negative Genitourinary: Urinary urgency Hematologic: History of rectal bleeding prior to stroke    Physical Exam:  Vital signs: BP 100/78 (BP Location: Left Arm, Patient Position: Sitting, Cuff Size: Normal)   Pulse (!) 112   Ht 5' 3 (1.6 m) Comment: height measured without shoes  Wt 200 lb 6 oz (90.9 kg)   LMP 11/06/2023  BMI 35.49 kg/m   Constitutional: Pleasant, overweight female in NAD, alert and cooperative Head:  Normocephalic and atraumatic.  Eyes: No scleral icterus.  Respiratory: Respirations even and unlabored. Lungs clear to auscultation bilaterally, no wheezes.  Cough is triggered by deep inhalation on exam. Cardiovascular:  Regular rate and rhythm. No murmurs. No peripheral edema. Gastrointestinal:  Soft, nondistended, nontender. No rebound or guarding. Normal bowel sounds. No appreciable masses or hepatomegaly. Rectal: Nonthrombosed, nonbleeding hemorrhoids seen on external exam.  Internal exam could not be performed due to patient discomfort.  No visible fissures.  Chaperone present for exam. Neurologic:  Alert and oriented x4;  grossly normal neurologically.  Skin:   Dry and intact without significant lesions or rashes. Psychiatric: Oriented to person, place and time. Demonstrates good judgement and reason without abnormal affect or behaviors.   RELEVANT LABS AND IMAGING: CBC    Component Value Date/Time   WBC 18.6 (H) 09/14/2023 0847   RBC 4.31 09/14/2023 0847   HGB 12.0 09/14/2023  0847   HGB 13.0 11/19/2018 1136   HCT 38.5 09/14/2023 0847   HCT 42.7 11/19/2018 1136   PLT 373 09/14/2023 0847   PLT 328 11/19/2018 1136   MCV 89.3 09/14/2023 0847   MCV 93 11/19/2018 1136   MCH 27.8 09/14/2023 0847   MCHC 31.2 09/14/2023 0847   RDW 14.3 09/14/2023 0847   RDW 13.2 11/19/2018 1136   LYMPHSABS 2.6 12/28/2022 0726   LYMPHSABS 3.3 (H) 11/19/2018 1136   MONOABS 0.8 12/28/2022 0726   EOSABS 0.6 (H) 12/28/2022 0726   EOSABS 0.4 11/19/2018 1136   BASOSABS 0.1 12/28/2022 0726   BASOSABS 0.1 11/19/2018 1136    CMP     Component Value Date/Time   NA 137 09/14/2023 0847   NA 132 (L) 03/04/2019 1834   K 3.3 (L) 09/14/2023 0847   CL 100 09/14/2023 0847   CO2 25 09/14/2023 0847   GLUCOSE 261 (H) 09/14/2023 0847   BUN 10 09/14/2023 0847   BUN 13 03/04/2019 1834   CREATININE 1.10 (H) 09/14/2023 0847   CALCIUM  9.5 09/14/2023 0847   PROT 8.3 (H) 09/14/2023 0847   PROT 7.1 11/19/2018 1136   ALBUMIN 3.7 09/14/2023 0847   ALBUMIN 4.0 11/19/2018 1136   AST 23 09/14/2023 0847   ALT 22 09/14/2023 0847   ALKPHOS 107 09/14/2023 0847   BILITOT 0.6 09/14/2023 0847   BILITOT 0.2 11/19/2018 1136   GFRNONAA >60 09/14/2023 0847   GFRAA 102 03/04/2019 1834   Echocardiogram 12/19/2022 1. Left ventricular ejection fraction, by estimation, is 65 to 70% . The left ventricle has normal function. The left ventricle has no regional wall motion abnormalities. There is moderate left ventricular hypertrophy. Left ventricular diastolic parameters were normal. The average left ventricular global longitudinal strain is - 22. 0 % . The global longitudinal strain is normal.  2. Right ventricular systolic function is normal. The right ventricular size is normal.  3. The mitral valve is normal in structure. Trivial mitral valve regurgitation. No evidence of mitral stenosis.  4. The aortic valve is tricuspid. Aortic valve regurgitation is not visualized. No aortic stenosis is present.  5. Agitated  saline contrast bubble study was negative, with no evidence of any interatrial shunt.  Assessment/Plan:   Constipation Fecal incontinence Rectal pain Hemorrhoids History of rectal bleeding Patient has been taking MiraLAX every other day which has improved her constipation, though she still has frequent feeling of incomplete emptying and has occasional fecal smearing/fecal incontinence if she mistakes  fecal urgency for the need to pass gas.  She did agree to rectal exam today, and on further discussion states that she has had pain with bowel movements since before her stroke.  Prior to stroke was having larger bowel movements, and states she occasionally would see some bright red blood as well.  Has not had any rectal bleeding in about a year.  Has not discussed this with other healthcare providers.  No prior colonoscopy.  Hemorrhoids are seen on exam, which was limited by patient's discomfort.  No fissures seen.  No visible bleeding.  - Will treat hemorrhoids with topical hydrocortisone cream.  If pain persists at follow-up will need to discuss colonoscopy for further evaluation of rectal pain with history of rectal bleeding. - Recommended she continue MiraLAX every other day. - Recommend starting Benefiber supplement, 1 tablespoon daily - Follow up 6 weeks  Chronic cough Oropharyngeal dysphagia Patient continues to have a cough which seems to be triggered by eating and drinking, and has a frequent feeling of throat irritation which triggers coughing.  Coughing was also triggered by deep inhalation on exam today. She is taking Protonix  but has not noticed any improvement in her symptoms on the medication.  Denies any acid reflux or heartburn. She did have modified barium swallow with speech pathology with no obvious aspiration risk noted.  She has been referred to ENT but does not have an appointment yet with them. At last office visit a barium esophagram was ordered but has not been scheduled.   Will work on this today.  - Schedule barium esophagram - Recommend scheduling appointment with ENT - Will refer to pulmonology for further evaluation of chronic cough   Camie Furbish, [name] [facility] 12/04/2023, 4:15 PM  Patient Care Team: Patient, No Pcp Per as PCP - General (General Practice)

## 2023-12-04 NOTE — Progress Notes (Signed)
 ____________________________________________________________  Attending physician addendum:  Thank you for sending this case to me. I have reviewed the entire note and agree with the plan.  She sounds as if she has an upper airway cough syndrome. Agree with esophagram and pulmonary consultation. ENT evaluation reasonable to mainly rule out vocal cord dysfunction, though they are likely to implicate GERD as a cause of this symptom.  Victory Brand, MD  ____________________________________________________________

## 2023-12-04 NOTE — Patient Instructions (Signed)
 Please purchase the following medications over the counter and take as directed: Benefiber one tablespoon daily and also over the counter topical hydrocortisone cream for hemorrhoids.  Continue Miralax every other day.  Schedule an appointment with your Ear, Nose and Throat doctor.  We have referred you to pulmonary for your chronic cough. The will contact you with an appointment.  You have been scheduled for a Barium Esophogram at [facility] (1st floor of the hospital) on 12/23/23 at 10:00am. Please arrive 30 minutes prior to your appointment for registration. Make certain not to have anything to eat or drink 3 hours prior to your test. If you need to reschedule for any reason, please contact radiology at [phone] to do so. __________________________________________________________________ A barium swallow is an examination that concentrates on views of the esophagus. This tends to be a double contrast exam (barium and two liquids which, when combined, create a gas to distend the wall of the oesophagus) or single contrast (non-ionic iodine based). The study is usually tailored to your symptoms so a good history is essential. Attention is paid during the study to the form, structure and configuration of the esophagus, looking for functional disorders (such as aspiration, dysphagia, achalasia, motility and reflux) EXAMINATION You may be asked to change into a gown, depending on the type of swallow being performed. A radiologist and radiographer will perform the procedure. The radiologist will advise you of the type of contrast selected for your procedure and direct you during the exam. You will be asked to stand, sit or lie in several different positions and to hold a small amount of fluid in your mouth before being asked to swallow while the imaging is performed .In some instances you may be asked to swallow barium coated marshmallows to assess the motility of a solid food bolus. The exam  can be recorded as a digital or video fluoroscopy procedure. POST PROCEDURE It will take 1-2 days for the barium to pass through your system. To facilitate this, it is important, unless otherwise directed, to increase your fluids for the next 24-48hrs and to resume your normal diet.  This test typically takes about 30 minutes to perform. __________________________________________________________________________________  _______________________________________________________  If your blood pressure at your visit was 140/90 or greater, please contact your primary care physician to follow up on this.  _______________________________________________________  If you are age 43 or older, your body mass index should be between 23-30. Your Body mass index is 35.49 kg/m. If this is out of the aforementioned range listed, please consider follow up with your Primary Care Provider.  If you are age 43 or younger, your body mass index should be between 19-25. Your Body mass index is 35.49 kg/m. If this is out of the aformentioned range listed, please consider follow up with your Primary Care Provider.   ________________________________________________________  The  GI providers would like to encourage you to use MYCHART to communicate with providers for non-urgent requests or questions.  Due to long hold times on the telephone, sending your provider a message by [facility] may be a faster and more efficient way to get a response.  Please allow 48 business hours for a response.  Please remember that this is for non-urgent requests.  _______________________________________________________  [facility] is using a team-based approach to care.  Your team is made up of your doctor and two to three APPS. Our APPS (Nurse Practitioners and Physician Assistants) work with your physician to ensure care continuity for you. They are fully  qualified to address your health concerns and develop a  treatment plan. They communicate directly with your gastroenterologist to care for you. Seeing the Advanced Practice Practitioners on your physician's team can help you by facilitating care more promptly, often allowing for earlier appointments, access to diagnostic testing, procedures, and other specialty referrals.

## 2023-12-10 ENCOUNTER — Encounter: Payer: Self-pay | Admitting: Pulmonary Disease

## 2023-12-17 ENCOUNTER — Ambulatory Visit: Attending: Registered Nurse

## 2023-12-17 DIAGNOSIS — R498 Other voice and resonance disorders: Secondary | ICD-10-CM | POA: Insufficient documentation

## 2023-12-17 DIAGNOSIS — R1310 Dysphagia, unspecified: Secondary | ICD-10-CM | POA: Diagnosis present

## 2023-12-17 NOTE — Patient Instructions (Signed)
    Practice abdominal breathing twice a day 10 minutes laying down or reclined 5 minutes sitting up

## 2023-12-17 NOTE — Therapy (Unsigned)
 OUTPATIENT SPEECH LANGUAGE PATHOLOGY TREATMENT   Patient Name: Lisa Werner MRN: [id number] DOB:06-23-1980, 43 y.o., female Today's Date: 12/17/2023  PCP: None per EMR REFERRING PROVIDER: Mavis Redge SAILOR, FNP  END OF SESSION:  End of Session - 12/17/23 1409     Visit Number 2    Number of Visits 9    Date for SLP Re-Evaluation 01/16/24    SLP Start Time 1407    SLP Stop Time  1445    SLP Time Calculation (min) 38 min    Activity Tolerance Patient tolerated treatment well           Past Medical History:  Diagnosis Date   Diabetes mellitus without complication (HCC)    Hyperlipidemia    Hypertension    No past surgical history on file. 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  Rationale for Evaluation and Treatment: Rehabilitation  SUBJECTIVE:   SUBJECTIVE STATEMENT: Reports CVA August 2024. Esophogram next Monday 12/23/23.  Pt accompanied by: self  PERTINENT HISTORY: L pontine CVA  PAIN:  Are you having pain? No  FALLS: Has patient fallen in last 6 months?  No   PATIENT GOALS: reduce cough  OBJECTIVE:  Note: Objective measures  were completed at Evaluation unless otherwise noted. OBJECTIVE:   DIAGNOSTIC FINDINGS: Per EMR  MR BRAIN WO CONTRASTMPRESSION: 1. Acute infarct in the left pons. 2. No intracranial large vessel occlusion or significant stenosis. 3. 1-2 mm laterally directed outpouching from the proximal right A2, which may represent a tiny aneurysm versus an infundibulum.   These results were called by telephone at the time of interpretation on 12/19/2022 at 8:25 pm to provider [facility], who verbally acknowledged these results.     Electronically Signed   By: Donald Campion M.D.   On: 12/19/2022 20:25    PATIENT REPORTED OUTCOME MEASURES (PROM): EAT-10: 5/40  [place] Laryngeal Hypersensitivity Questionnaire: 14.1 (~17.1+ considered WNL)   - Throat Obstruction: 5.2   -Throat Pain/Thermal: 5.3 - Throat Tickle: 3.6 (most bothersome)  TREATMENT DATE:  Abdominal breathing = AB  12/17/23: Pt denies coughing episodes with change in temperatures, or with certain odors. Coughing worse in mornings. Wakes up coughing, SLP suspected GERD but pt took Protonix  for 6 weeks without change in frequency of waking with coughing. Cont to endorse coughing with liquids, rarely. Pt has still not heard from ENT physician office so SLP encouraged pt to call [facility] office to inquire about ENT eval.  Today SLP introduced abdominal breathing with pt and noted correct production approx 80% of the time in seated, after 30 seconds success decr'd to approx 70%. SLP told pt to practice AB 10 minutes reclined or supine, and 5 minutes seated.   PATIENT EDUCATION: Education details: see Treatment date for more information Person educated: Patient Education method: Explanation, Demonstration, Verbal cues, and Handouts Education comprehension: verbalized understanding, returned demonstration, verbal  cues required, and needs further education   ASSESSMENT:  CLINICAL IMPRESSION: Pt is a 44 yo female who presents to ST OP for ST for cough. Pt endorses coughing with liquids, foods, and at random times throughout the day - sometimes to hard that she vomits.  Pt reports she often wakes up the feeling she needs to cough or clear throat, then can go back to sleep. Also endorses re: laughing can cause coughing and feeling embarrassed. Pt was assessed with Alanna Hila Protocol - she passed with no overt s/sx of aspiration. Due to coughing not strictly related to swallowing, SLP administered the [facility] Laryngeal Sensitivity Questionnaire, along with EAT-10. SLP suspects pt is suffering from chronic cough with unknown origin. SLP is recommended a referral to ENT to visualize laryngeal area and identify any obvious causes of cough. To consider MBS after ENT and esophogram, or with worsening sx. Pt was observed to cough x5 in today's session. No immediate coughs following liquids were observed. SLP rec skilled ST services targeting safe swallow strategies and chronic cough strategies.      OBJECTIVE IMPAIRMENTS: include voice disorder and dysphagia. These impairments are limiting patient from safety when swallowing and impacting QOL. Factors affecting potential to achieve goals and functional outcome are NA. Patient will benefit from skilled SLP services to address above impairments and improve overall function.  REHAB POTENTIAL: Good   GOALS: Goals reviewed with patient? Yes  SHORT TERM GOALS: Target date: 12/22/23  Pt will use trained throat clear alternatives to decr cough to 3, in 2 sessions Baseline: 11/15/23 Goal status: INITIAL   Pt will demo abdominal breathing in sentence responses 80% of the time, in two sessions Baseline:  Goal status: INITIAL  3.  Pt will report a reduction in behaviors that contribute to acid reflux, as evidenced by decreased symptoms. Baseline:  Goal status:  INITIAL    LONG TERM GOALS: Target date: 01/22/24  Pt will improve PROM/s compared to initial administration Baseline:  Goal status: INITIAL    Pt will use trained chronic cough alternatives to decr cough to 2, in 2 sessions Baseline:  Goal status: INITIAL    Pt will demo abdominal breathing in simple-mod complex conversation 80% of the time, in two sessions Baseline:  Goal status: INITIAL   PLAN:  SLP FREQUENCY: 1x/week  SLP DURATION: 8 weeks  PLANNED INTERVENTIONS: Aspiration precaution training, Diet toleration management , Environmental controls, Cueing hierachy, SLP instruction and feedback, Compensatory strategies, Patient/family education, [phone] Treatment of speech (30 or 45 min) , and [id number] Treatment of swallowing function    Haleem Hanner, CCC-SLP 12/17/2023, 2:09 PM

## 2023-12-23 ENCOUNTER — Ambulatory Visit (HOSPITAL_COMMUNITY)
Admission: RE | Admit: 2023-12-23 | Discharge: 2023-12-23 | Disposition: A | Discharge status: Home / Self Care | Source: Ambulatory Visit | Attending: Gastroenterology | Admitting: Gastroenterology

## 2023-12-23 DIAGNOSIS — R053 Chronic cough: Secondary | ICD-10-CM | POA: Insufficient documentation

## 2023-12-23 DIAGNOSIS — R1312 Dysphagia, oropharyngeal phase: Secondary | ICD-10-CM | POA: Insufficient documentation

## 2023-12-23 DIAGNOSIS — K5900 Constipation, unspecified: Secondary | ICD-10-CM | POA: Diagnosis present

## 2023-12-23 DIAGNOSIS — R151 Fecal smearing: Secondary | ICD-10-CM | POA: Diagnosis present

## 2023-12-24 ENCOUNTER — Ambulatory Visit: Payer: Self-pay | Admitting: Gastroenterology

## 2023-12-24 ENCOUNTER — Ambulatory Visit

## 2023-12-24 DIAGNOSIS — R498 Other voice and resonance disorders: Secondary | ICD-10-CM

## 2023-12-24 DIAGNOSIS — R1310 Dysphagia, unspecified: Secondary | ICD-10-CM

## 2023-12-24 NOTE — Patient Instructions (Signed)
   If you have a cough spell, do one, two, or all of these:  Sip breathing purse your lips and take in 1/2 of a breath then let it out - keep the pursed lips  Sniff-easy blow - sniff hard, then purse your lips and let the air come out easily like in sip breathing  3-second box breathing - breath in for 3 seconds, hold it for three seconds, swallow, then let it out for three seconds.  When you have an episode avoid taking full breaths - this may extend the episode.

## 2023-12-24 NOTE — Therapy (Signed)
 OUTPATIENT SPEECH LANGUAGE PATHOLOGY TREATMENT   Patient Name: Lisa Werner MRN: [id number] DOB:1981/04/25, 43 y.o., female Today's Date: 12/24/2023  PCP: None per EMR REFERRING PROVIDER: Mavis Redge SAILOR, FNP  END OF SESSION:  End of Session - 12/24/23 1354     Visit Number 3    Number of Visits 9    Date for SLP Re-Evaluation 01/16/24    Activity Tolerance Patient tolerated treatment well           Past Medical History:  Diagnosis Date   Diabetes mellitus without complication (HCC)    Hyperlipidemia    Hypertension    No past surgical history on file. 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   Left pontine stroke (HCC) 12/24/2022   CVA (cerebral vascular accident) (HCC) 12/20/2022   Acute ischemic stroke (HCC) 12/19/2022   Leukocytosis 12/19/2022   Acute right-sided weakness 12/19/2022   Essential hypertension 02/21/2019   Noncompliance with diabetes treatment 07/17/2017   Mixed hyperlipidemia 06/03/2017   Pain of left eye 06/03/2017   Urinary frequency 06/03/2017   DM2 (diabetes mellitus, type 2) (HCC) 09/28/2013    ONSET DATE: referred on 10/08/23  REFERRING DIAG:  I63.9 (ICD-10-CM) - Left pontine stroke (HCC)  T17.308A (ICD-10-CM) - Choking, initial encounter    THERAPY DIAG:  Other voice and resonance disorders  Dysphagia, unspecified type  Rationale for Evaluation and Treatment: Rehabilitation  SUBJECTIVE:   SUBJECTIVE STATEMENT: .  Pt accompanied by: self  PERTINENT HISTORY: L pontine CVA  PAIN:  Are you having pain? No  FALLS: Has patient fallen in last 6 months?  No   PATIENT GOALS: reduce cough  OBJECTIVE:  Note: Objective measures were completed at Evaluation unless otherwise noted. OBJECTIVE:   DIAGNOSTIC FINDINGS: Per  EMR  MR BRAIN WO CONTRASTMPRESSION: 1. Acute infarct in the left pons. 2. No intracranial large vessel occlusion or significant stenosis. 3. 1-2 mm laterally directed outpouching from the proximal right A2, which may represent a tiny aneurysm versus an infundibulum.   These results were called by telephone at the time of interpretation on 12/19/2022 at 8:25 pm to provider [facility], who verbally acknowledged these results.     Electronically Signed   By: Donald Campion M.D.   On: 12/19/2022 20:25    PATIENT REPORTED OUTCOME MEASURES (PROM): EAT-10: 5/40  [place] Laryngeal Hypersensitivity Questionnaire: 14.1 (~17.1+ considered WNL)   - Throat Obstruction: 5.2   -Throat Pain/Thermal: 5.3 - Throat Tickle: 3.6 (most bothersome)                                                                                                                            TREATMENT DATE:  Abdominal breathing = AB  12/24/23: Pt has not heard from ENT or her FNP's office about  scheduling ENT eval. SLP and pt to call today. Esophogram yesterday was WFL/WNL.Pt reports she is unsure the frequency of coughing due to spells and the coughing due to strangling. SLP told pt to begin to keep a log of coughing for SLP to address next session. Today pt entered with coughing spell and took over 60 seconds for it to clear. Pt states they can last up to 5 minutes. SLP taught pt breathing strategies for chronic cough today: sip breathing, sniff-blow, and box breathing. SLP demonstrated each for pt and she return demonstrated. 3 minutes later she told SLP each strategy and then 5 minutes later demonstrated each for pt, with min A for pursed lips for sip breathing. She demonstrated each technique again 10 minutes later with cues for relaxed inhalation (not quick) for sip breathing. SLP called [facility]) office and left message for recommendation of ENT eval.   12/17/23: Pt denies coughing episodes with change in  temperatures, or with certain odors. Coughing worse in mornings. Wakes up coughing, SLP suspected GERD but pt took Protonix  for 6 weeks without change in frequency of waking with coughing. Cont to endorse coughing with liquids, rarely. Pt has still not heard from ENT physician office so SLP encouraged pt to call [facility]' office to inquire about ENT eval.  Today SLP introduced abdominal breathing with pt and noted correct production approx 80% of the time in seated, after 30 seconds success decr'd to approx 70%. SLP told pt to practice AB 10 minutes reclined or supine, and 5 minutes seated.   PATIENT EDUCATION: Education details: see Treatment date for more information Person educated: Patient Education method: Explanation, Demonstration, Verbal cues, and Handouts Education comprehension: verbalized understanding, returned demonstration, verbal cues required, and needs further education   ASSESSMENT:  CLINICAL IMPRESSION: Pt is a 43 yo female who presents to ST OP for ST for cough. Pt endorses coughing with liquids, foods, and other random times throughout the day - but she is unsure of the frequency. SLP told pt to start making a log of her coughing. Pt reports she often wakes up the feeling she needs to cough or clear throat, then can go back to sleep. Also endorses re: laughing can cause coughing and feeling embarrassed. Pt was assessed with Alanna Hila Protocol on eval date- she passed with no overt s/sx of aspiration. Due to coughing not strictly related to swallowing, SLP administered the [facility] Laryngeal Sensitivity Questionnaire, along with EAT-10. SLP suspects pt is suffering from chronic cough with unknown origin. SLP is recommended a referral to ENT to visualize laryngeal area and identify any obvious causes of cough. To consider MBS after ENT and esophogram, or with worsening sx. Pt was observed to cough for 60-90 seconds in today's session.   OBJECTIVE IMPAIRMENTS: include  voice disorder and dysphagia. These impairments are limiting patient from safety when swallowing and impacting QOL. Factors affecting potential to achieve goals and functional outcome are NA. Patient will benefit from skilled SLP services to address above impairments and improve overall function.  REHAB POTENTIAL: Good   GOALS: Goals reviewed with patient? Yes  SHORT TERM GOALS: Target date: 12/22/23  Pt will use trained throat clear alternatives to decr cough to 3, in 2 sessions Baseline: 11/15/23 Goal status: INITIAL   Pt will demo abdominal breathing in sentence responses 80% of the time, in two sessions Baseline:  Goal status: INITIAL  3.  Pt will report a reduction in behaviors that contribute to acid reflux, as evidenced  by decreased symptoms. Baseline:  Goal status: INITIAL    LONG TERM GOALS: Target date: 01/22/24  Pt will improve PROM/s compared to initial administration Baseline:  Goal status: INITIAL    Pt will use trained chronic cough alternatives to decr cough to 2, in 2 sessions Baseline:  Goal status: INITIAL    Pt will demo abdominal breathing in simple-mod complex conversation 80% of the time, in two sessions Baseline:  Goal status: INITIAL   PLAN:  SLP FREQUENCY: 1x/week  SLP DURATION: 8 weeks  PLANNED INTERVENTIONS: Aspiration precaution training, Diet toleration management , Environmental controls, Cueing hierachy, SLP instruction and feedback, Compensatory strategies, Patient/family education, [phone] Treatment of speech (30 or 45 min) , and [id number] Treatment of swallowing function    Aidynn Polendo, CCC-SLP 12/24/2023, 1:54 PM

## 2023-12-25 ENCOUNTER — Encounter (INDEPENDENT_AMBULATORY_CARE_PROVIDER_SITE_OTHER): Payer: Self-pay

## 2023-12-26 NOTE — Telephone Encounter (Signed)
 Called the patient. No answer. Voicemail is not set up.

## 2023-12-26 NOTE — Telephone Encounter (Signed)
-----   Message from Camie FORBES Furbish sent at 12/26/2023  3:49 PM EDT ----- Can see if she wants to try Anusol suppositories. Can send 10 for her to use once nightly. She should continue Benefiber and Miralax.  Dr. Legrand reviewed her chart and barium swallow findings and does not think she needs an EGD based on these results.  If she continues to have rectal pain, will set up colonoscopy when we see her for follow up. ----- Message ----- From: Concha Almarie LABOR, LPN Sent: 1/73/7974   4:40 PM EDT To: Camie FORBES Furbish, [name]  Bowel movements are still painful. Cream may have helped a little. 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 Otherwise normal. Tablet passed easily into the stomach.  We may want to consider EGD with possible dilation in the future. I will discuss further with Dr. Legrand.  Please also ask patient if she has had any improvement in rectal pain with topical hydrocortisone cream or if is still having pain with bowel movements. Thank you ----- Message ----- From: Interface, Rad Results In Sent: 12/23/2023   1:04 PM EDT To: Camie FORBES Furbish, [name]

## 2023-12-31 ENCOUNTER — Ambulatory Visit: Attending: Registered Nurse

## 2023-12-31 DIAGNOSIS — R498 Other voice and resonance disorders: Secondary | ICD-10-CM | POA: Insufficient documentation

## 2023-12-31 DIAGNOSIS — R1310 Dysphagia, unspecified: Secondary | ICD-10-CM | POA: Insufficient documentation

## 2024-01-01 ENCOUNTER — Encounter (INDEPENDENT_AMBULATORY_CARE_PROVIDER_SITE_OTHER): Payer: Self-pay

## 2024-01-01 NOTE — Telephone Encounter (Signed)
 No answer. No voicemail.

## 2024-01-08 ENCOUNTER — Ambulatory Visit

## 2024-01-08 DIAGNOSIS — R498 Other voice and resonance disorders: Secondary | ICD-10-CM

## 2024-01-08 DIAGNOSIS — R1310 Dysphagia, unspecified: Secondary | ICD-10-CM | POA: Diagnosis present

## 2024-01-08 NOTE — Patient Instructions (Addendum)
   [facility] [phone] Should have a referral from [facility] Budge for an ENT evaluation for chronic cough - tell them you had a pontine stroke in August 2024   [facility]ge Budge, FNP   [address] [place], [place] [address]  Phone: [phone]

## 2024-01-08 NOTE — Therapy (Signed)
 OUTPATIENT SPEECH LANGUAGE PATHOLOGY TREATMENT   Patient Name: Lisa Werner MRN: [id number] DOB:[birth year], 43 y.o., female Today's Date: 01/08/2024  PCP: None per EMR REFERRING PROVIDER: Mavis Redge SAILOR, FNP  END OF SESSION:  End of Session - 01/08/24 1411     Visit Number 4    Number of Visits 9    Date for SLP Re-Evaluation 01/16/24    SLP Start Time 1410   pt had to use restroom prior to ST   SLP Stop Time  1448    SLP Time Calculation (min) 38 min    Activity Tolerance Patient tolerated treatment well           Past Medical History:  Diagnosis Date   Diabetes mellitus without complication (HCC)    Hyperlipidemia    Hypertension    History reviewed. No pertinent surgical history. 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   Left pontine stroke (HCC) 12/24/2022   CVA (cerebral vascular accident) (HCC) 12/20/2022   Acute ischemic stroke (HCC) 12/19/2022   Leukocytosis 12/19/2022   Acute right-sided weakness 12/19/2022   Essential hypertension 02/21/2019   Noncompliance with diabetes treatment 07/17/2017   Mixed hyperlipidemia 06/03/2017   Pain of left eye 06/03/2017   Urinary frequency 06/03/2017   DM2 (diabetes mellitus, type 2) (HCC) 09/28/2013    ONSET DATE: referred on 10/08/23  REFERRING DIAG:  I63.9 (ICD-10-CM) - Left pontine stroke (HCC)  T17.308A (ICD-10-CM) - Choking, initial encounter    THERAPY DIAG:  Other voice and resonance disorders  Dysphagia, unspecified type  Rationale for Evaluation and Treatment: Rehabilitation  SUBJECTIVE:   SUBJECTIVE STATEMENT: Probably 20 (coughing episodes/day).  Pt accompanied by: self  PERTINENT HISTORY: L pontine CVA  PAIN:  Are you having pain? No  FALLS: Has patient fallen in last 6  months?  No   PATIENT GOALS: reduce cough  OBJECTIVE:  Note: Objective measures were completed at Evaluation unless otherwise noted. OBJECTIVE:   DIAGNOSTIC FINDINGS: Per EMR  MR BRAIN WO CONTRASTMPRESSION: 1. Acute infarct in the left pons. 2. No intracranial large vessel occlusion or significant stenosis. 3. 1-2 mm laterally directed outpouching from the proximal right A2, which may represent a tiny aneurysm versus an infundibulum.   These results were called by telephone at the time of interpretation on 12/19/2022 at 8:25 pm to provider [facility], who verbally acknowledged these results.     Electronically Signed   By: Donald Campion M.D.   On: 12/19/2022 20:25    PATIENT REPORTED OUTCOME MEASURES (PROM): EAT-10: 5/40  [place] Laryngeal Hypersensitivity Questionnaire: 14.1 (~17.1+ considered WNL)   - Throat Obstruction: 5.2   -Throat Pain/Thermal: 5.3 - Throat Tickle: 3.6 (most bothersome)  TREATMENT DATE:  Abdominal breathing = AB  01/08/24: No throat clears today. Pt has not heard about ENT after SLP called pt's FNP after last session. SLP saw referral in the que and told pt that it looks like she has been called x2 - SLP provided number for [facility] specialists (See pt instructions). In EPIC, states referral has been placed. Pt states her box breathing works best for coughing episodes during the day, which are more frequent she says, up to 20/day. Pt demonstrated box breathing for SLP today with independence. SLP suggested pt's next appointmetn would be following ENT evaluation as long as breathing strategies are helpful for pt, which pt stated they have been. Pt also mentioned she cont to struggle with balance issues and SLP suggested pt contact FNP and ask about a referral for PT at this clinic which could be coordinated with her ST.  Pt was in  agreement with this. SLP provided pt number for her FNP to call and suggest this.  Pt expressed frustration with FNP's office about filling out short term disability paperwork and what she described as slowness to respond to questions and concerns she has had in the past, since her CVA. SLP informed her of other [facility] primary care practices and other primary care practices close to this clinic that she may want to consider if disappointed with the care she has rec'd from her current clinic.   12/24/23: Pt has not heard from ENT or her FNP's office about scheduling ENT eval. SLP and pt to call today. Esophogram yesterday was WFL/WNL.Pt reports she is unsure the frequency of coughing due to spells and the coughing due to strangling. SLP told pt to begin to keep a log of coughing for SLP to address next session. Today pt entered with coughing spell and took over 60 seconds for it to clear. Pt states they can last up to 5 minutes. SLP taught pt breathing strategies for chronic cough today: sip breathing, sniff-blow, and box breathing. SLP demonstrated each for pt and she return demonstrated. 3 minutes later she told SLP each strategy and then 5 minutes later demonstrated each for pt, with min A for pursed lips for sip breathing. She demonstrated each technique again 10 minutes later with cues for relaxed inhalation (not quick) for sip breathing. SLP called [facility]) office and left message for recommendation of ENT eval.   12/17/23: Pt denies coughing episodes with change in temperatures, or with certain odors. Coughing worse in mornings. Wakes up coughing, SLP suspected GERD but pt took Protonix  for 6 weeks without change in frequency of waking with coughing. Cont to endorse coughing with liquids, rarely. Pt has still not heard from ENT physician office so SLP encouraged pt to call [facility]' office to inquire about ENT eval.  Today SLP introduced abdominal breathing with pt and noted  correct production approx 80% of the time in seated, after 30 seconds success decr'd to approx 70%. SLP told pt to practice AB 10 minutes reclined or supine, and 5 minutes seated.   PATIENT EDUCATION: Education details: see Treatment date for more information Person educated: Patient Education method: Explanation, Demonstration, Verbal cues, and Handouts Education comprehension: verbalized understanding, returned demonstration, verbal cues required, and needs further education   ASSESSMENT:  CLINICAL IMPRESSION: Pt is a 43 yo female who presents to ST OP for ST for cough. Pt endorses coughing with liquids, foods, and other random times throughout the day - but she is unsure of  the frequency. SLP told pt to start making a log of her coughing. Pt reports she often wakes up the feeling she needs to cough or clear throat, then can go back to sleep. Also endorses re: laughing can cause coughing and feeling embarrassed. Pt was assessed with Alanna Hila Protocol on eval date- she passed with no overt s/sx of aspiration. Due to coughing not strictly related to swallowing, SLP administered the [facility] Laryngeal Sensitivity Questionnaire, along with EAT-10. SLP suspects pt is suffering from chronic cough with unknown origin. SLP is recommended a referral to ENT to visualize laryngeal area and identify any obvious causes of cough. To consider MBS after ENT and esophogram, or with worsening sx. Pt was observed to cough for 60-90 seconds in today's session.   OBJECTIVE IMPAIRMENTS: include voice disorder and dysphagia. These impairments are limiting patient from safety when swallowing and impacting QOL. Factors affecting potential to achieve goals and functional outcome are NA. Patient will benefit from skilled SLP services to address above impairments and improve overall function.  REHAB POTENTIAL: Good   GOALS: Goals reviewed with patient? Yes  SHORT TERM GOALS: Target date: 12/22/23  Pt will use  trained throat clear alternatives to decr cough to 3, in 2 sessions Baseline: 11/15/23 Goal status: met   Pt will demo abdominal breathing in sentence responses 80% of the time, in two sessions Baseline:  Goal status: INITIAL  3.  Pt will report a reduction in behaviors that contribute to acid reflux, as evidenced by decreased symptoms. Baseline:  Goal status: INITIAL    LONG TERM GOALS: Target date: 01/22/24  Pt will improve PROM/s compared to initial administration Baseline:  Goal status: INITIAL    Pt will use trained chronic cough alternatives to decr cough to 2, in 2 sessions Baseline:  Goal status: INITIAL    Pt will demo abdominal breathing in simple-mod complex conversation 80% of the time, in two sessions Baseline:  Goal status: INITIAL   PLAN:  SLP FREQUENCY: 1x/week  SLP DURATION: 8 weeks  PLANNED INTERVENTIONS: Aspiration precaution training, Diet toleration management , Environmental controls, Cueing hierachy, SLP instruction and feedback, Compensatory strategies, Patient/family education, [phone] Treatment of speech (30 or 45 min) , and [id number] Treatment of swallowing function    Wiley Magan, CCC-SLP 01/08/2024, 2:12 PM

## 2024-01-10 ENCOUNTER — Encounter (INDEPENDENT_AMBULATORY_CARE_PROVIDER_SITE_OTHER): Payer: Self-pay

## 2024-01-14 ENCOUNTER — Ambulatory Visit

## 2024-01-20 ENCOUNTER — Ambulatory Visit (INDEPENDENT_AMBULATORY_CARE_PROVIDER_SITE_OTHER): Admitting: Gastroenterology

## 2024-01-20 ENCOUNTER — Encounter: Payer: Self-pay | Admitting: Gastroenterology

## 2024-01-20 VITALS — BP 112/66 | HR 85 | Ht 62.5 in | Wt 199.0 lb

## 2024-01-20 DIAGNOSIS — K5900 Constipation, unspecified: Secondary | ICD-10-CM | POA: Diagnosis not present

## 2024-01-20 DIAGNOSIS — K6289 Other specified diseases of anus and rectum: Secondary | ICD-10-CM

## 2024-01-20 DIAGNOSIS — Z8673 Personal history of transient ischemic attack (TIA), and cerebral infarction without residual deficits: Secondary | ICD-10-CM

## 2024-01-20 DIAGNOSIS — R1312 Dysphagia, oropharyngeal phase: Secondary | ICD-10-CM | POA: Diagnosis not present

## 2024-01-20 DIAGNOSIS — R151 Fecal smearing: Secondary | ICD-10-CM

## 2024-01-20 DIAGNOSIS — R053 Chronic cough: Secondary | ICD-10-CM

## 2024-01-20 MED ORDER — LINACLOTIDE 72 MCG PO CAPS: 72.0000 ug | ORAL_CAPSULE | Freq: Every day | ORAL | Status: AC

## 2024-01-20 MED ORDER — LINACLOTIDE 145 MCG PO CAPS: 145.0000 ug | ORAL_CAPSULE | Freq: Every day | ORAL | 3 refills | Status: AC

## 2024-01-20 MED ORDER — NA SULFATE-K SULFATE-MG SULF 17.5-3.13-1.6 GM/177ML PO SOLN: 1.0000 | Freq: Once | ORAL | 0 refills | Status: AC

## 2024-01-20 NOTE — Progress Notes (Signed)
 Lisa Werner [id number] 1980/06/08   Chief Complaint: Constipation, dysphagia  Referring Provider: No ref. provider found Primary GI MD: Dr. Legrand  HPI: Lisa Werner is a 43 y.o. 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    09/14/2023 patient seen in the ED for complaint of cough, nausea, vomiting.  Reported nonproductive cough for 2 months, with vomiting over the last 3 days after persistent coughing.  Reported pain in the center of her chest after coughing.  Some intermittent shortness of breath.  She was noted to have leukocytosis with WBC 18.6, hypokalemia (3.3), pyuria, and bacteruria.  EKG showed sinus tachycardia and was otherwise normal.  CXR showed no acute process. On reassessment patient had no chest pain or shortness of breath and heart rate improved after fluids.  Advised to have pulmonology follow-up for persistent symptoms.   10/04/2023 patient had follow-up with PCP ([facility]).  At that visit patient reported ongoing cough since her stroke, sometimes accompanied by wheezing.  No history of asthma or smoking.  Severe coughing can lead to vomiting.  Tessalon  as prescribed following ED visit was ineffective.  Patient also reported urinary and fecal incontinence.   Patient was scheduled for neurology follow-up of her stroke 10/31/2023.   Initially seen in our office 10/15/2023, at which time patient reported a persistent cough since her stroke 11/2022.  Can be triggered by eating or drinking.  She had occasional regurgitation but denied any heartburn or acid reflux.  Also reported some constipation and fecal smearing.  Declined rectal exam.  Denies seeing any blood or melena. She was started on Protonix  40 mg daily.   She did have a modified barium swallow with outpatient rehab.  She had no overt signs and symptoms of aspiration. ENT referral was recommended.  Seen in  follow-up by PCP 11/04/2023. Noted to be experiencing episodes of low blood sugar. Per note, she has not started taking MiraLAX, fiber, or Protonix  due to financial constraints. Has not yet consulted with pulmonology.   At last visit 12/04/2023 patient reported improvement in constipation on MiraLAX every other day, though still having frequent feeling of incomplete emptying and occasional fecal smearing/incontinence.  Endorsed having pain with bowel movements since prior to stroke.  Also endorsed history of BRBPR though nothing over the last year.  Hemorrhoids seen on exam, which was limited by patient's discomfort.  Treated with topical hydrocortisone cream with consideration for colonoscopy if symptoms persist at follow-up, particularly given history of rectal bleeding.  Advised to start daily Benefiber supplement and continue MiraLAX.  Patient continued to have cough and throat irritation.  Denied any improvement on Protonix .  Denied any acid reflux or heartburn.  Barium swallow showed a possible mild, smooth stricture in the distal esophagus and otherwise normal.  Tablet passed easily into the stomach.  Per Dr. Legrand, EGD not necessary based on these findings, but will plan for colonoscopy if she continues to have rectal pain. Chronic cough could be due to upper airway cough syndrome.  ---------------------------------TODAY----------------------------------------  Patient states her bowel movements have improved.  She has been taking MiraLAX and fiber every other day, on alternating days.  Having a bowel movement twice daily now, though she still has intermittent fecal urgency, intermittent rectal pain with bowel movements, and often is passing only a small amount of stool.  Denies any rectal bleeding or melena.  States that sometimes she can have unpredictable  bowel movements with associated fecal urgency, as well as intermittent incontinence/fecal smearing.  States prior to her stroke she was having  larger bowel movements, now has more straining and smaller bowel movements.  Has some occasional abdominal cramps if she has been sitting for a while and then stands up.  States she is scheduled to see ENT in October.  Has been seeing a speech therapist who has told her they cannot do much else until she is evaluated by ENT.  She has not been taking Protonix , but was not having any acid reflux or heartburn symptoms.  She denies any food getting stuck in her throat or chest, but continues to have coughing after eating or drinking.  Previous GI Procedures/Imaging   Barium swallow 12/23/2023 1. Smooth tapering of the distal esophagus at the gastroesophageal junction. No mucosal irregularity is seen in this vicinity, but I was only able to distend the distal most esophagus up to about 10 mm. A mild smooth stricture cannot be excluded. The 13 mm barium tablet passed briskly into the stomach without delay.  Past Medical History:  Diagnosis Date   Diabetes mellitus without complication (HCC)    Hyperlipidemia    Hypertension     History reviewed. No pertinent surgical history.  Current Outpatient Medications  Medication Sig Dispense Refill   acetaminophen  (TYLENOL ) 325 MG tablet Take 1-2 tablets (325-650 mg total) by mouth every 4 (four) hours as needed for mild pain.     AgaMatrix Ultra-Thin Lancets MISC Check blood glucose before meals 3 times daily. 100 each 11   aspirin  EC 81 MG tablet Take 1 tablet (81 mg total) by mouth daily. Swallow whole. 120 tablet 0   Blood Glucose Monitoring Suppl (AGAMATRIX PRESTO) w/Device KIT Check blood glucose 3 times daily before meals 1 kit 0   enalapril (VASOTEC) 2.5 MG tablet Take 2.5 mg by mouth 2 (two) times daily.     glucose blood (AGAMATRIX PRESTO TEST) test strip Check blood glucose 3 times daily before meals 100 each 12   hydrochlorothiazide (HYDRODIURIL) 12.5 MG tablet Take 12.5 mg by mouth every morning.     insulin  aspart (NOVOLOG  FLEXPEN) 100  UNIT/ML FlexPen Inject 18 Units into the skin 3 (three) times daily with meals. 15 mL 0   Insulin  Pen Needle (BD PEN NEEDLE NANO U/F) 32G X 4 MM MISC Use 4 (four) times daily. 100 each 0   melatonin 3 MG TABS tablet Take 1 tablet (3 mg total) by mouth at bedtime as needed (insomnia). 60 tablet 0   METAMUCIL FIBER PO Take 1 Dose by mouth daily.     pantoprazole  (PROTONIX ) 40 MG tablet Take 1 tablet (40 mg total) by mouth daily. 30 tablet 3   rosuvastatin  (CRESTOR ) 40 MG tablet Take 1 tablet (40 mg total) by mouth at bedtime. 30 tablet 0   TRESIBA  FLEXTOUCH 100 UNIT/ML FlexTouch Pen Inject 33 Units into the skin daily AND 23 Units at bedtime. (Patient taking differently: 48 units in the mornings) 15 mL 1   No current facility-administered medications for this visit.    Allergies as of 01/20/2024 - Review Complete 01/20/2024  Allergen Reaction Noted   Pollen extract  01/09/2020    Family History  Problem Relation Age of Onset   Arthritis Mother    Kidney disease Mother    Asthma Father    Diabetes Maternal Grandmother    Breast cancer Maternal Grandmother    Breast cancer Paternal Grandmother    Stroke Paternal Grandmother  Social History   Tobacco Use   Smoking status: Never   Smokeless tobacco: Never  Vaping Use   Vaping status: Never Used  Substance Use Topics   Alcohol use: No   Drug use: No     Review of Systems:    Constitutional: No weight loss, fever, chills Cardiovascular: No chest pain Respiratory: No SOB Gastrointestinal: See HPI and otherwise negative Hematologic: No bleeding   Physical Exam:  Vital signs: BP 112/66   Pulse 85   Ht 5' 2.5 (1.588 m)   Wt 199 lb (90.3 kg)   BMI 35.82 kg/m   Wt Readings from Last 3 Encounters:  01/20/24 199 lb (90.3 kg)  12/04/23 200 lb 6 oz (90.9 kg)  10/15/23 198 lb 8 oz (90 kg)    Constitutional: Pleasant, obese female in NAD, alert and cooperative Head:  Normocephalic and atraumatic.  Eyes: No scleral  icterus.  Respiratory: Respirations even and unlabored. Lungs clear to auscultation bilaterally.  No wheezes, crackles, or rhonchi.  Cardiovascular:  Regular rate and rhythm. No murmurs. No peripheral edema. Gastrointestinal:  Soft, nondistended, nontender. No rebound or guarding. Normal bowel sounds. No appreciable masses or hepatomegaly. Rectal:  Deferred to colonoscopy. Neurologic:  Alert and oriented x4;  grossly normal neurologically.  Skin:   Dry and intact without significant lesions or rashes. Psychiatric: Oriented to person, place and time. Demonstrates good judgement and reason without abnormal affect or behaviors.   RELEVANT LABS AND IMAGING: CBC    Component Value Date/Time   WBC 18.6 (H) 09/14/2023 0847   RBC 4.31 09/14/2023 0847   HGB 12.0 09/14/2023 0847   HGB 13.0 11/19/2018 1136   HCT 38.5 09/14/2023 0847   HCT 42.7 11/19/2018 1136   PLT 373 09/14/2023 0847   PLT 328 11/19/2018 1136   MCV 89.3 09/14/2023 0847   MCV 93 11/19/2018 1136   MCH 27.8 09/14/2023 0847   MCHC 31.2 09/14/2023 0847   RDW 14.3 09/14/2023 0847   RDW 13.2 11/19/2018 1136   LYMPHSABS 2.6 12/28/2022 0726   LYMPHSABS 3.3 (H) 11/19/2018 1136   MONOABS 0.8 12/28/2022 0726   EOSABS 0.6 (H) 12/28/2022 0726   EOSABS 0.4 11/19/2018 1136   BASOSABS 0.1 12/28/2022 0726   BASOSABS 0.1 11/19/2018 1136    CMP     Component Value Date/Time   NA 137 09/14/2023 0847   NA 132 (L) 03/04/2019 1834   K 3.3 (L) 09/14/2023 0847   CL 100 09/14/2023 0847   CO2 25 09/14/2023 0847   GLUCOSE 261 (H) 09/14/2023 0847   BUN 10 09/14/2023 0847   BUN 13 03/04/2019 1834   CREATININE 1.10 (H) 09/14/2023 0847   CALCIUM  9.5 09/14/2023 0847   PROT 8.3 (H) 09/14/2023 0847   PROT 7.1 11/19/2018 1136   ALBUMIN 3.7 09/14/2023 0847   ALBUMIN 4.0 11/19/2018 1136   AST 23 09/14/2023 0847   ALT 22 09/14/2023 0847   ALKPHOS 107 09/14/2023 0847   BILITOT 0.6 09/14/2023 0847   BILITOT 0.2 11/19/2018 1136   GFRNONAA >60  09/14/2023 0847   GFRAA 102 03/04/2019 1834   Echocardiogram 12/20/2022 1. Left ventricular ejection fraction, by estimation, is 65 to 70% . The left ventricle has normal function. The left ventricle has no regional wall motion abnormalities. There is moderate left ventricular hypertrophy. Left ventricular diastolic parameters were normal. The average left ventricular global longitudinal strain is - 22. 0 % . The global longitudinal strain is normal.  2. Right ventricular systolic function is  normal. The right ventricular size is normal.  3. The mitral valve is normal in structure. Trivial mitral valve regurgitation. No evidence of mitral stenosis.  4. The aortic valve is tricuspid. Aortic valve regurgitation is not visualized. No aortic stenosis is present.  5. Agitated saline contrast bubble study was negative, with no evidence of any interatrial shunt.  Assessment/Plan:   Constipation Fecal smearing Fecal urgency Rectal pain Patient continues to have constipation and passage of small stools with intermittent fecal urgency, intermittent rectal pain, as well as occasional fecal smearing/incontinence.  She has noticed some improvement since starting regimen of MiraLAX and fiber, taking these every other day on alternating days.  Does have history of rectal bleeding prior to her stroke but has not noticed any rectal bleeding in the last year.  - Schedule colonoscopy for further evaluation of change in bowel habits and to rule out underlying malignancy. - Advised patient to try taking fiber supplement and MiraLAX daily.  If no improvement, can try Linzess  72 mcg, and if this is inadequate can try Linzess  145 mcg.  Will give samples today. - Consider element of pelvic floor dysfunction.  May benefit from pelvic floor PT evaluation.  Oropharyngeal dysphagia Chronic cough Patient continues to have chronic cough which tends to be triggered by eating and drinking.  Denies any food getting stuck in  her throat or chest.  Is following with speech therapy and plans for ENT evaluation next month.  Recent barium swallow showed smooth tapering of the distal esophagus at the GE junction with no mucosal irregularity, though mild smooth stricture could not be excluded.  Barium tablet passed briskly into the stomach.  Again, patient denies any sensation of food getting stuck in her lower chest and is not having any heartburn or acid reflux.  Discussed with Dr. Legrand who felt EGD was not needed at this time.  - Encouraged follow-up with ENT and speech therapy - No plans to restart Protonix  at this time since it did not improve her symptoms and she denies reflux/heartburn.  History of CVA 11/2022  - Request neurologic clearance for procedure   Camie Furbish, [name] [facility] 01/20/2024, 2:48 PM  Patient Care Team: Mavis Redge SAILOR, FNP as PCP - General (Nurse Practitioner)

## 2024-01-20 NOTE — Patient Instructions (Addendum)
 Start daily fiber supplement.   Start Miralax 1 capful daily in 8 ounces of liquid.  If no improvement try Linzess  72 mcg daily before breakfast (samples provided).   May increase to Linzess  145 mcg if lower dose ineffective (samples provided).  You have been scheduled for a colonoscopy. Please follow written instructions given to you at your visit today.   If you use inhalers (even only as needed), please bring them with you on the day of your procedure.  DO NOT TAKE 7 DAYS PRIOR TO TEST- Trulicity (dulaglutide) Ozempic, Wegovy (semaglutide) Mounjaro (tirzepatide) Bydureon Bcise (exanatide extended release)  DO NOT TAKE 1 DAY PRIOR TO YOUR TEST Rybelsus (semaglutide) Adlyxin (lixisenatide) Victoza (liraglutide) Byetta (exanatide) _______________________________________________________________________

## 2024-01-21 ENCOUNTER — Encounter

## 2024-01-22 ENCOUNTER — Ambulatory Visit (INDEPENDENT_AMBULATORY_CARE_PROVIDER_SITE_OTHER)

## 2024-01-22 VITALS — BP 112/72 | HR 69 | Temp 97.8°F | Ht 62.0 in | Wt 197.8 lb

## 2024-01-22 DIAGNOSIS — R053 Chronic cough: Secondary | ICD-10-CM | POA: Diagnosis not present

## 2024-01-22 DIAGNOSIS — E1121 Type 2 diabetes mellitus with diabetic nephropathy: Secondary | ICD-10-CM

## 2024-01-22 MED ORDER — VALSARTAN 40 MG PO TABS: 40.0000 mg | ORAL_TABLET | Freq: Every day | ORAL | 3 refills | Status: AC

## 2024-01-22 NOTE — Addendum Note (Signed)
 Addended by: Loa Idler on: 01/22/2024 03:32 PM   Modules accepted: Orders

## 2024-01-22 NOTE — Patient Instructions (Addendum)
 It was nice meeting you today in the clinic  It is unclear at this time why you have a chronic cough.  Enalapril can cause a cough so I switched enalapril to valsartan .  Please start taking valsartan  instead.  Will get pulmonary function tests to further evaluate your lung function and make sure your lungs are okay.   In the meantime I want you to adhere to a full GERD diet as below.  I will see you back in the clinic in 6 weeks   Foods to Avoid:  Acidic foods: Citrus fruits, tomatoes, tomato-based products, vinegar, garlic, onions  Fatty foods: Fried foods, fatty meats, whole milk, cheese  Spicy foods: Chili peppers, black pepper, mustard  Chocolate: Contains caffeine and theobromine, which relax the lower esophageal sphincter (LES)  Alcohol: Relaxes the LES and increases stomach acid production  Caffeine: Found in coffee, tea, and some sodas, it can stimulate stomach acid production    Foods to Eat:  Lean proteins: Chicken, fish, eggs Non-acidic fruits: Apples, bananas, pears, grapes Vegetables: Steamed, roasted, or boiled vegetables (e.g., broccoli, carrots, green beans) Whole grains: Brown rice, quinoa, oatmeal Low-fat dairy: Skim milk, low-fat yogurt, low-fat cheese Water: Staying hydrated helps dilute stomach acid    Other Dietary Recommendations: Eat smaller, more frequent meals. Avoid eating within 2-3 hours of bedtime. Chew food thoroughly. Limit sugary drinks and processed foods. Consider a Mediterranean-style diet, which is rich in fruits, vegetables, and whole grains.

## 2024-01-22 NOTE — Progress Notes (Signed)
 Subjective:   PATIENT ID: Lisa Werner GENDER: female DOB: 17-Jun-1980, MRN: [id number]   HPI 43 year old female with a past medical history of diabetes, hyperlipidemia, history of pontine stroke, concern for dysphagia, history of GERD who is presenting to the pulmonary clinic for further evaluation of a chronic refractory cough.  Patient had an SLP evaluation with no noted aspiration events.  She is being followed by GI and has a new appointment with ENT as well to further evaluate her vocal cords.  Patient states that she has been coughing for the past year.  She was not able to provide further information about her medical history other than what I found in the chart.  She denies any history of asthma.  She is a never smoker.  She denies any allergies.  She denies any postnasal drip.  She denies any sinus congestion.  She denies any recent URIs prior to the start of her cough.  Per her chart she has been tried on a PPI for her cough without any result after 6 weeks.  She has no pets at home.  She does not work currently.  Past Medical History:  Diagnosis Date   Diabetes mellitus without complication (HCC)    Hyperlipidemia    Hypertension      Family History  Problem Relation Age of Onset   Arthritis Mother    Kidney disease Mother    Asthma Father    Diabetes Maternal Grandmother    Breast cancer Maternal Grandmother    Breast cancer Paternal Grandmother    Stroke Paternal Grandmother      Social History   Socioeconomic History   Marital status: Single    Spouse name: Not on file   Number of children: Not on file   Years of education: Not on file   Highest education level: Not on file  Occupational History   Not on file  Tobacco Use   Smoking status: Never   Smokeless tobacco: Never  Vaping Use   Vaping status: Never Used  Substance and Sexual Activity   Alcohol use: No   Drug use: No   Sexual activity: Never  Other Topics Concern   Not on file   Social History Narrative   Not on file   Social Drivers of Health   Financial Resource Strain: Low Risk  (11/04/2023)   [facility]   Overall Financial Resource Strain (CARDIA)    Difficulty of Paying Living Expenses: Not hard at all  Food Insecurity: Food Insecurity Present (11/04/2023)   Received from [facility]   Hunger Vital Sign    Within the past 12 months, you worried that your food would run out before you got the money to buy more.: Sometimes true    Within the past 12 months, the food you bought just didn't last and you didn't have money to get more.: Sometimes true  Transportation Needs: No Transportation Needs (11/04/2023)   Received from [facility] - Transportation    Lack of Transportation (Medical): No    Lack of Transportation (Non-Medical): No  Physical Activity: Inactive (11/04/2023)   Received from [facility]   Exercise Vital Sign    On average, how many days per week do you engage in moderate to strenuous exercise (like a brisk walk)?: 0 days    On average, how many minutes do you engage in exercise at this level?: 60 min  Stress: Stress Concern Present (11/04/2023)   Received from  [facility]   [employer] [facility] - Occupational Stress Questionnaire    Feeling of Stress : To some extent  Social Connections: Socially Integrated (11/04/2023)   Received from [facility]   Social Network    How would you rate your social network (family, work, friends)?: Good participation with social networks  Intimate Partner Violence: Not At Risk (11/04/2023)   Received from [facility]   HITS    Over the last 12 months how often did your partner physically hurt you?: Never    Over the last 12 months how often did your partner insult you or talk down to you?: Never    Over the last 12 months how often did your partner threaten you with physical harm?: Never    Over the last 12 months how often did your partner scream or curse  at you?: Never     Allergies  Allergen Reactions   Pollen Extract      Outpatient Medications Prior to Visit  Medication Sig Dispense Refill   acetaminophen  (TYLENOL ) 325 MG tablet Take 1-2 tablets (325-650 mg total) by mouth every 4 (four) hours as needed for mild pain.     AgaMatrix Ultra-Thin Lancets MISC Check blood glucose before meals 3 times daily. 100 each 11   aspirin  EC 81 MG tablet Take 1 tablet (81 mg total) by mouth daily. Swallow whole. 120 tablet 0   Blood Glucose Monitoring Suppl (AGAMATRIX PRESTO) w/Device KIT Check blood glucose 3 times daily before meals 1 kit 0   glucose blood (AGAMATRIX PRESTO TEST) test strip Check blood glucose 3 times daily before meals 100 each 12   hydrochlorothiazide (HYDRODIURIL) 12.5 MG tablet Take 12.5 mg by mouth every morning.     insulin  aspart (NOVOLOG  FLEXPEN) 100 UNIT/ML FlexPen Inject 18 Units into the skin 3 (three) times daily with meals. 15 mL 0   Insulin  Pen Needle (BD PEN NEEDLE NANO U/F) 32G X 4 MM MISC Use 4 (four) times daily. 100 each 0   linaclotide  (LINZESS ) 145 MCG CAPS capsule Take 1 capsule (145 mcg total) by mouth daily before breakfast. 30 capsule 3   linaclotide  (LINZESS ) 72 MCG capsule Take 1 capsule (72 mcg total) by mouth daily before breakfast.     melatonin 3 MG TABS tablet Take 1 tablet (3 mg total) by mouth at bedtime as needed (insomnia). 60 tablet 0   METAMUCIL FIBER PO Take 1 Dose by mouth daily.     pantoprazole  (PROTONIX ) 40 MG tablet Take 1 tablet (40 mg total) by mouth daily. 30 tablet 3   rosuvastatin  (CRESTOR ) 40 MG tablet Take 1 tablet (40 mg total) by mouth at bedtime. 30 tablet 0   TRESIBA  FLEXTOUCH 100 UNIT/ML FlexTouch Pen Inject 33 Units into the skin daily AND 23 Units at bedtime. (Patient taking differently: 48 units in the mornings) 15 mL 1   enalapril (VASOTEC) 2.5 MG tablet Take 2.5 mg by mouth 2 (two) times daily.     No facility-administered medications prior to visit.    ROS Reviewed  all systems and reported negative except as above     Objective:   Vitals:   01/22/24 1443  BP: 112/72  Pulse: 69  Temp: 97.8 F (36.6 C)  TempSrc: Temporal  SpO2: 98%  Weight: 197 lb 12.8 oz (89.7 kg)  Height: 5' 2 (1.575 m)    Physical Exam General: Middle-aged female not in acute distress Chest: Clear to auscultation bilaterally Heart: Regular rate and rhythm,  normal S1, S2 Extremities: Warm, well-perfused Abdomen: Soft nontender    CBC    Component Value Date/Time   WBC 18.6 (H) 09/14/2023 0847   RBC 4.31 09/14/2023 0847   HGB 12.0 09/14/2023 0847   HGB 13.0 11/19/2018 1136   HCT 38.5 09/14/2023 0847   HCT 42.7 11/19/2018 1136   PLT 373 09/14/2023 0847   PLT 328 11/19/2018 1136   MCV 89.3 09/14/2023 0847   MCV 93 11/19/2018 1136   MCH 27.8 09/14/2023 0847   MCHC 31.2 09/14/2023 0847   RDW 14.3 09/14/2023 0847   RDW 13.2 11/19/2018 1136   LYMPHSABS 2.6 12/28/2022 0726   LYMPHSABS 3.3 (H) 11/19/2018 1136   MONOABS 0.8 12/28/2022 0726   EOSABS 0.6 (H) 12/28/2022 0726   EOSABS 0.4 11/19/2018 1136   BASOSABS 0.1 12/28/2022 0726   BASOSABS 0.1 11/19/2018 1136     Chest imaging:  I personally reviewed her chest x-ray performed in May which does not show any infiltrates.  She also had an esophagram which shows smooth tapering of the distal esophagus at the GE junction.  Cannot exclude distal stricture.  She is followed by GI.   PFT: No PFTs on file   Labs: Her most recent CBC is from May and shows an elevated white count at 18.6.  At that time she was noted to also have eosinophilia.  Had repeat blood work since then      Assessment & Plan:   Assessment & Plan Chronic cough Patient with chronic cough of unclear etiology.  Most prominent finding is eosinophilia on her blood work from May.  We will obtain an allergy  panel and recheck her CBC with differential.  Will need to rule out eosinophilic bronchitis. Will obtain PFTs.  Advised to adhere to  a GERD diet.  Will follow-up in 6 weeks.  I will also check IgE levels  Orders:   Pulmonary Function Test; Future   valsartan  (DIOVAN ) 40 MG tablet; Take 1 tablet (40 mg total) by mouth daily.   CBC w/Diff; Future   RESPIRATORY ALLERGY  PANEL REGION II W/ RFLX: [place]; Future   IgE; Future  Diabetic nephropathy associated with type 2 diabetes mellitus (HCC) Enalapril is known to cause cough as an ACE inhibitor.  I switched her to valsartan . Orders:   valsartan  (DIOVAN ) 40 MG tablet; Take 1 tablet (40 mg total) by mouth daily.    Zola Herter, MD [facility] Office: [phone]

## 2024-01-23 ENCOUNTER — Telehealth: Payer: Self-pay

## 2024-01-23 DIAGNOSIS — S00522A Blister (nonthermal) of oral cavity, initial encounter: Secondary | ICD-10-CM

## 2024-01-23 LAB — CBC WITH DIFFERENTIAL/PLATELET
Basophils Absolute: 0.2 K/uL — ABNORMAL HIGH (ref 0.0–0.1)
Basophils Relative: 1.1 % (ref 0.0–3.0)
Eosinophils Absolute: 0.1 K/uL (ref 0.0–0.7)
Eosinophils Relative: 0.8 % (ref 0.0–5.0)
HCT: 37.7 % (ref 36.0–46.0)
Hemoglobin: 12 g/dL (ref 12.0–15.0)
Lymphocytes Relative: 9.3 % — ABNORMAL LOW (ref 12.0–46.0)
Lymphs Abs: 1.7 K/uL (ref 0.7–4.0)
MCHC: 31.7 g/dL (ref 30.0–36.0)
MCV: 85.1 fl (ref 78.0–100.0)
Monocytes Absolute: 0.7 K/uL (ref 0.1–1.0)
Monocytes Relative: 3.7 % (ref 3.0–12.0)
Neutro Abs: 16.1 K/uL — ABNORMAL HIGH (ref 1.4–7.7)
Neutrophils Relative %: 85.1 % — ABNORMAL HIGH (ref 43.0–77.0)
Platelets: 433 K/uL — ABNORMAL HIGH (ref 150.0–400.0)
RBC: 4.43 Mil/uL (ref 3.87–5.11)
RDW: 14.8 % (ref 11.5–15.5)
WBC: 18.9 K/uL (ref 4.0–10.5)

## 2024-01-23 MED ORDER — AMOXICILLIN-POT CLAVULANATE 875-125 MG PO TABS: 1.0000 | ORAL_TABLET | Freq: Two times a day (BID) | ORAL | 0 refills | Status: DC

## 2024-01-23 NOTE — Telephone Encounter (Signed)
 Called patient back, she states that she has a gum infection and her face is swollen. No other symptoms at this time. She will schedule an appointment with the dentist. In the meantime, I have ordered a course of Augmentin  for her and repeat CBC in 5 days. Advised her to talk to her PCP and go to [facility] if she has any new symptoms or starts feeling worse.    Zola Herter, MD [facility] Office: [phone]   See Amion for personal pager PCCM on call pager [phone] until 7pm. Please call Elink 7p-7a. [phone]

## 2024-01-23 NOTE — Progress Notes (Signed)
 ____________________________________________________________  Attending physician addendum:  Thank you for sending this case to me. I have reviewed the entire note and agree with the plan.  This sounds like upper airway cough syndrome, though ENT is likely to implicate GERD.  Victory Brand, MD  ____________________________________________________________

## 2024-01-23 NOTE — Telephone Encounter (Signed)
 Received call report from Darice with [facility]. WBC 18.9  Dr.Hattar, please advise. Thanks

## 2024-01-24 LAB — RESPIRATORY ALLERGY PANEL REGION II W/ RFLX: ~~LOC~~
Allergen, A. alternata, m6: <0.1 kU/L
Allergen, Cedar tree, t12: <0.1 kU/L
Allergen, Comm Silver Birch, t9: <0.1 kU/L
Allergen, Cottonwood, t14: <0.1 kU/L
Allergen, Mouse Urine Protein, e78: <0.1 kU/L
Allergen, Mulberry, t76: <0.1 kU/L
Allergen, Oak,t7: <0.1 kU/L
Allergen, P. notatum, m1: <0.1 kU/L
Aspergillus fumigatus, m3: <0.1 kU/L
Bermuda Grass: <0.1 kU/L
Box Elder IgE: <0.1 kU/L
CLADOSPORIUM HERBARUM (M2) IGE: <0.1 kU/L
COMMON RAGWEED (SHORT) (W1) IGE: <0.1 kU/L
Cat Dander: <0.1 kU/L
Class: 0
Class: 0
Class: 0
Class: 0
Class: 0
Class: 0
Class: 0
Class: 0
Class: 0
Class: 0
Class: 0
Class: 0
Class: 0
Class: 0
Class: 0
Class: 0
Class: 0
Class: 0
Class: 0
Class: 0
Class: 0
Class: 0
Class: 0
Class: 0
Cockroach: <0.1 kU/L
D. farinae: <0.1 kU/L
Dog Dander: <0.1 kU/L
Elm IgE: <0.1 kU/L
IgE (Immunoglobulin E), Serum: 91 kU/L (ref <=114)
IgE (Immunoglobulin E), Serum: <91 kU/L (ref <=114)
Johnson Grass: <0.1 kU/L
Pecan/Hickory Tree IgE: <0.1 kU/L
Rough Pigweed  IgE: <0.1 kU/L
Sheep Sorrel IgE: <0.1 kU/L
Timothy Grass: <0.1 kU/L

## 2024-01-24 LAB — INTERPRETATION:

## 2024-01-24 LAB — IGE: IgE (Immunoglobulin E), Serum: 92 kU/L (ref <=114)

## 2024-01-28 ENCOUNTER — Ambulatory Visit

## 2024-02-10 ENCOUNTER — Telehealth: Payer: Self-pay | Admitting: *Deleted

## 2024-02-10 NOTE — Telephone Encounter (Signed)
 [facility] [address] [place], [place]  [address] Phone:  [phone]   Fax:  [phone]   Azra Abrell DOB: 1980/11/26 MRN: [id number]  Dear: Duwaine Russell, NP:   The patient above is schedule for a colonoscopy in the near future under general anesthesia (Propofol).    Please fax or route a note of Medical Clearance to [phone], Attn: Powell Misty, CMA.    Please advise if this patient will require an office visit or further medical work-up before clearance can be given.   Thank you,   Powell Misty, [facility]

## 2024-02-11 NOTE — Telephone Encounter (Signed)
**Note De-identified  [place] Obfuscation** Please advise 

## 2024-02-12 NOTE — Telephone Encounter (Signed)
 Please advise if patient is cleared for the [facility].

## 2024-02-14 ENCOUNTER — Emergency Department (HOSPITAL_COMMUNITY)

## 2024-02-14 ENCOUNTER — Emergency Department (HOSPITAL_COMMUNITY)
Admission: EM | Admit: 2024-02-14 | Discharge: 2024-02-14 | Disposition: A | Discharge status: Home / Self Care | Source: Ambulatory Visit | Attending: Emergency Medicine | Admitting: Emergency Medicine

## 2024-02-14 ENCOUNTER — Other Ambulatory Visit: Payer: Self-pay

## 2024-02-14 ENCOUNTER — Encounter (HOSPITAL_COMMUNITY): Payer: Self-pay | Admitting: *Deleted

## 2024-02-14 DIAGNOSIS — Z79899 Other long term (current) drug therapy: Secondary | ICD-10-CM | POA: Insufficient documentation

## 2024-02-14 DIAGNOSIS — Z8673 Personal history of transient ischemic attack (TIA), and cerebral infarction without residual deficits: Secondary | ICD-10-CM | POA: Diagnosis not present

## 2024-02-14 DIAGNOSIS — R799 Abnormal finding of blood chemistry, unspecified: Secondary | ICD-10-CM | POA: Diagnosis present

## 2024-02-14 DIAGNOSIS — I10 Essential (primary) hypertension: Secondary | ICD-10-CM | POA: Insufficient documentation

## 2024-02-14 DIAGNOSIS — Z7982 Long term (current) use of aspirin: Secondary | ICD-10-CM | POA: Diagnosis not present

## 2024-02-14 DIAGNOSIS — R000 Tachycardia, unspecified: Secondary | ICD-10-CM | POA: Diagnosis not present

## 2024-02-14 DIAGNOSIS — N390 Urinary tract infection, site not specified: Secondary | ICD-10-CM

## 2024-02-14 DIAGNOSIS — E860 Dehydration: Secondary | ICD-10-CM | POA: Insufficient documentation

## 2024-02-14 LAB — CBG MONITORING, ED
Glucose-Capillary: 404 mg/dL — ABNORMAL HIGH (ref 70–99)
Glucose-Capillary: 272 mg/dL — ABNORMAL HIGH (ref 70–99)
Glucose-Capillary: 172 mg/dL — ABNORMAL HIGH (ref 70–99)
Glucose-Capillary: 98 mg/dL (ref 70–99)

## 2024-02-14 LAB — I-STAT CHEM 8, ED
BUN: 13 mg/dL (ref 6–20)
Calcium, Ion: 1.14 mmol/L — ABNORMAL LOW (ref 1.15–1.40)
Chloride: 94 mmol/L — ABNORMAL LOW (ref 98–111)
Creatinine, Ser: 1.4 mg/dL — ABNORMAL HIGH (ref 0.44–1.00)
Glucose, Bld: 337 mg/dL — ABNORMAL HIGH (ref 70–99)
HCT: 36 % (ref 36.0–46.0)
Hemoglobin: 12.2 g/dL (ref 12.0–15.0)
Potassium: 3.7 mmol/L (ref 3.5–5.1)
Sodium: 131 mmol/L — ABNORMAL LOW (ref 135–145)
TCO2: 27 mmol/L (ref 22–32)

## 2024-02-14 LAB — OSMOLALITY: Osmolality: 282 mosm/kg (ref 275–295)

## 2024-02-14 LAB — BLOOD GAS, VENOUS
Acid-Base Excess: 2.2 mmol/L — ABNORMAL HIGH (ref 0.0–2.0)
Bicarbonate: 27.3 mmol/L (ref 20.0–28.0)
O2 Saturation: 86.4 %
Patient temperature: 36.1
pCO2, Ven: 41 mmHg — ABNORMAL LOW (ref 44–60)
pH, Ven: 7.42 (ref 7.25–7.43)
pO2, Ven: 50 mmHg — ABNORMAL HIGH (ref 32–45)

## 2024-02-14 LAB — COMPREHENSIVE METABOLIC PANEL WITH GFR
ALT: 8 U/L (ref 0–44)
AST: 20 U/L (ref 15–41)
Albumin: 3.7 g/dL (ref 3.5–5.0)
Alkaline Phosphatase: 140 U/L — ABNORMAL HIGH (ref 38–126)
Anion gap: 14 (ref 5–15)
BUN: 12 mg/dL (ref 6–20)
CO2: 25 mmol/L (ref 22–32)
Calcium: 9.8 mg/dL (ref 8.9–10.3)
Chloride: 90 mmol/L — ABNORMAL LOW (ref 98–111)
Creatinine, Ser: 1.38 mg/dL — ABNORMAL HIGH (ref 0.44–1.00)
GFR, Estimated: 48 mL/min — ABNORMAL LOW (ref >60)
Glucose, Bld: 364 mg/dL — ABNORMAL HIGH (ref 70–99)
Potassium: 3.9 mmol/L (ref 3.5–5.1)
Sodium: 129 mmol/L — ABNORMAL LOW (ref 135–145)
Total Bilirubin: 0.5 mg/dL (ref 0.0–1.2)
Total Protein: 8.3 g/dL — ABNORMAL HIGH (ref 6.5–8.1)

## 2024-02-14 LAB — CBC WITH DIFFERENTIAL/PLATELET
Abs Immature Granulocytes: 0.13 K/uL — ABNORMAL HIGH (ref 0.00–0.07)
Basophils Absolute: 0.1 K/uL (ref 0.0–0.1)
Basophils Relative: 1 %
Eosinophils Absolute: 0.3 K/uL (ref 0.0–0.5)
Eosinophils Relative: 1 %
HCT: 35.6 % — ABNORMAL LOW (ref 36.0–46.0)
Hemoglobin: 11.4 g/dL — ABNORMAL LOW (ref 12.0–15.0)
Immature Granulocytes: 1 %
Lymphocytes Relative: 10 %
Lymphs Abs: 2 K/uL (ref 0.7–4.0)
MCH: 27.2 pg (ref 26.0–34.0)
MCHC: 32 g/dL (ref 30.0–36.0)
MCV: 85 fL (ref 80.0–100.0)
Monocytes Absolute: 0.8 K/uL (ref 0.1–1.0)
Monocytes Relative: 4 %
Neutro Abs: 17.1 K/uL — ABNORMAL HIGH (ref 1.7–7.7)
Neutrophils Relative %: 83 %
Platelets: 349 K/uL (ref 150–400)
RBC: 4.19 MIL/uL (ref 3.87–5.11)
RDW: 13.2 % (ref 11.5–15.5)
WBC: 20.5 K/uL — ABNORMAL HIGH (ref 4.0–10.5)
nRBC: 0 % (ref 0.0–0.2)

## 2024-02-14 LAB — URINALYSIS, ROUTINE W REFLEX MICROSCOPIC
Bilirubin Urine: NEGATIVE
Glucose, UA: >=500 mg/dL — AB
Ketones, ur: NEGATIVE mg/dL
Nitrite: NEGATIVE
Protein, ur: >=300 mg/dL — AB
Specific Gravity, Urine: 1.014 (ref 1.005–1.030)
WBC, UA: >50 WBC/hpf (ref 0–5)
pH: 5 (ref 5.0–8.0)

## 2024-02-14 LAB — BASIC METABOLIC PANEL WITH GFR
Anion gap: 11 (ref 5–15)
BUN: 13 mg/dL (ref 6–20)
CO2: 27 mmol/L (ref 22–32)
Calcium: 9.8 mg/dL (ref 8.9–10.3)
Chloride: 96 mmol/L — ABNORMAL LOW (ref 98–111)
Creatinine, Ser: 1.24 mg/dL — ABNORMAL HIGH (ref 0.44–1.00)
GFR, Estimated: 55 mL/min — ABNORMAL LOW (ref >60)
Glucose, Bld: 112 mg/dL — ABNORMAL HIGH (ref 70–99)
Potassium: 3.7 mmol/L (ref 3.5–5.1)
Sodium: 134 mmol/L — ABNORMAL LOW (ref 135–145)

## 2024-02-14 LAB — HCG, SERUM, QUALITATIVE: Preg, Serum: NEGATIVE

## 2024-02-14 LAB — I-STAT CG4 LACTIC ACID, ED
Lactic Acid, Venous: 3.7 mmol/L (ref 0.5–1.9)
Lactic Acid, Venous: 2.2 mmol/L (ref 0.5–1.9)
Lactic Acid, Venous: 1.5 mmol/L (ref 0.5–1.9)

## 2024-02-14 LAB — BETA-HYDROXYBUTYRIC ACID: Beta-Hydroxybutyric Acid: 0.1 mmol/L (ref 0.05–0.27)

## 2024-02-14 MED ORDER — LACTATED RINGERS IV SOLN: INTRAVENOUS | Status: DC

## 2024-02-14 MED ORDER — CEFADROXIL 500 MG PO CAPS: 500.0000 mg | ORAL_CAPSULE | Freq: Two times a day (BID) | ORAL | 0 refills | Status: AC

## 2024-02-14 MED ORDER — LACTATED RINGERS IV BOLUS
1000.0000 mL | Freq: Once | INTRAVENOUS | Status: AC
  Administered 2024-02-14: 1000 mL via INTRAVENOUS

## 2024-02-14 MED ORDER — DEXTROSE 50 % IV SOLN: 0.0000 mL | INTRAVENOUS | Status: DC | PRN

## 2024-02-14 MED ORDER — FLUCONAZOLE 150 MG PO TABS: 150.0000 mg | ORAL_TABLET | Freq: Every day | ORAL | 0 refills | Status: AC

## 2024-02-14 MED ORDER — DEXTROSE IN LACTATED RINGERS 5 % IV SOLN: INTRAVENOUS | Status: DC

## 2024-02-14 MED FILL — Insulin Regular (Human) in NaCl 0.9% IV Soln 100 Unit/100ML: INTRAVENOUS | Qty: 100 | Status: AC

## 2024-02-14 NOTE — ED Provider Notes (Signed)
 [place] EMERGENCY DEPARTMENT AT [facility] Provider Note   CSN: [id number] Arrival date & time: 02/14/24  9073     Patient presents with: low NA, high BS   Lisa Werner is a 43 y.o. 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  Patient reports that she was seen by her PCP for regular checkup where they have lab work performed.  Reports that her sodium was low and that her sugar was high and to come into the emergency department.  Patient reports that she has been noncompliant with her insulin  for about a week.  She reports that she initially did not have access to it but her dad was able to bring it by for her yesterday.  She reports that she gave herself 15 units of short acting last night and gave herself 16 units of short acting this morning.  She reports that she does not need any additional refills of her long-acting, she is has not picked it up from [facility].  She denies any chest pain, shortness of breath, belly pain.  Reports that she had an episode of nausea and vomiting around 2 days prior but has not had any since.  She denies any fever or chills.  She had a URI around 2 weeks ago that was given Augmentin .  Has been having a chronic cough however this has been for months and unchanged.  She denies any dysuria, hematuria, headache, or vision changes  HPI     Prior to Admission medications   Medication Sig Start Date End Date Taking? Authorizing Provider  cefadroxil (DURICEF) 500 MG capsule Take 1 capsule (500 mg total) by mouth 2 (two) times daily for 7 days. 02/14/24 02/21/24 Yes Bernis Ernst, [name]  fluconazole  (DIFLUCAN ) 150 MG tablet Take 1 tablet (150 mg total) by mouth daily for 1 day. Take after finishing your antibiotics 02/14/24 02/15/24 Yes Bernis Ernst, [name]  acetaminophen  (TYLENOL ) 325 MG tablet Take 1-2  tablets (325-650 mg total) by mouth every 4 (four) hours as needed for mild pain. 01/08/23   Love, [name], [name]  AgaMatrix Ultra-Thin Lancets MISC Check blood glucose before meals 3 times daily. 11/19/18   Adella Norris, MD  aspirin  EC 81 MG tablet Take 1 tablet (81 mg total) by mouth daily. Swallow whole. 01/08/23   Love, [name], [name]  Blood Glucose Monitoring Suppl (AGAMATRIX PRESTO) w/Device KIT Check blood glucose 3 times daily before meals 11/19/18   Adella Norris, MD  glucose blood (AGAMATRIX PRESTO TEST) test strip Check blood glucose 3 times daily before meals 11/19/18   Adella Norris, MD  hydrochlorothiazide (HYDRODIURIL) 12.5 MG tablet Take 12.5 mg by mouth every morning.    [provider]  insulin  aspart (NOVOLOG  FLEXPEN) 100 UNIT/ML FlexPen Inject 18 Units into the skin 3 (three) times daily with meals. 01/08/23   Love, [name], [name]  Insulin  Pen Needle (BD PEN NEEDLE NANO U/F) 32G X 4 MM MISC Use 4 (four) times daily. 01/08/23   Love, [name], [name]  linaclotide  (LINZESS ) 145 MCG CAPS capsule Take 1 capsule (145 mcg total) by mouth daily before breakfast. 01/20/24   Arletta, [name], [name]  linaclotide  (LINZESS ) 72 MCG capsule Take 1 capsule (72 mcg total) by mouth daily before breakfast. 01/20/24   Arletta, [name], [name]  melatonin 3 MG TABS tablet Take 1 tablet (3 mg total) by mouth at  bedtime as needed (insomnia). 01/08/23   Love, [name], [name]  METAMUCIL FIBER PO Take 1 Dose by mouth daily.    [provider]  pantoprazole  (PROTONIX ) 40 MG tablet Take 1 tablet (40 mg total) by mouth daily. 10/15/23   Heinz, [name], [name]  rosuvastatin  (CRESTOR ) 40 MG tablet Take 1 tablet (40 mg total) by mouth at bedtime. 01/08/23   Love, [name], [name]  TRESIBA  FLEXTOUCH 100 UNIT/ML FlexTouch Pen Inject 33 Units into the skin daily AND 23 Units at bedtime. Patient taking differently: 48 units in the mornings 01/08/23   Love, [name], [name]  valsartan  (DIOVAN ) 40 MG tablet Take  1 tablet (40 mg total) by mouth daily. 01/22/24   Hattar, Zola SAILOR, MD    Allergies: Pollen extract    Review of Systems  Constitutional:  Positive for fatigue. Negative for chills and fever.  Eyes:  Negative for visual disturbance.  Respiratory:  Negative for shortness of breath.   Cardiovascular:  Negative for chest pain.  Gastrointestinal:  Negative for abdominal pain.  Genitourinary:  Negative for dysuria and hematuria.  Neurological:  Negative for headaches.    Updated Vital Signs BP 115/86 (BP Location: Right Arm)   Pulse 94   Temp 98.3 F (36.8 C) (Oral)   Resp 17   Wt 89.4 kg   LMP 02/07/2024 (Approximate)   SpO2 95%   BMI 36.03 kg/m   Physical Exam Vitals and nursing note reviewed.  Constitutional:      General: She is not in acute distress.    Appearance: She is not ill-appearing or toxic-appearing.  HENT:     Mouth/Throat:     Mouth: Mucous membranes are dry.  Eyes:     General: No scleral icterus. Cardiovascular:     Rate and Rhythm: Tachycardia present.  Pulmonary:     Effort: Pulmonary effort is normal. No respiratory distress.  Abdominal:     Palpations: Abdomen is soft.     Tenderness: There is no abdominal tenderness. There is no guarding or rebound.  Skin:    General: Skin is warm and dry.  Neurological:     Mental Status: She is alert.     (all labs ordered are listed, but only abnormal results are displayed) Labs Reviewed  CBC WITH DIFFERENTIAL/PLATELET - Abnormal; Notable for the following components:      Result Value   WBC 20.5 (*)    Hemoglobin 11.4 (*)    HCT 35.6 (*)    Neutro Abs 17.1 (*)    Abs Immature Granulocytes 0.13 (*)    All other components within normal limits  URINALYSIS, ROUTINE W REFLEX MICROSCOPIC - Abnormal; Notable for the following components:   APPearance CLOUDY (*)    Glucose, UA >=500 (*)    Hgb urine dipstick SMALL (*)    Protein, ur >=300 (*)    Leukocytes,Ua LARGE (*)    Bacteria, UA MANY (*)    All  other components within normal limits  BLOOD GAS, VENOUS - Abnormal; Notable for the following components:   pCO2, Ven 41 (*)    pO2, Ven 50 (*)    Acid-Base Excess 2.2 (*)    All other components within normal limits  COMPREHENSIVE METABOLIC PANEL WITH GFR - Abnormal; Notable for the following components:   Sodium 129 (*)    Chloride 90 (*)    Glucose, Bld 364 (*)    Creatinine, Ser 1.38 (*)    Total Protein 8.3 (*)  Alkaline Phosphatase 140 (*)    GFR, Estimated 48 (*)    All other components within normal limits  CBG MONITORING, ED - Abnormal; Notable for the following components:   Glucose-Capillary 404 (*)    All other components within normal limits  I-STAT CHEM 8, ED - Abnormal; Notable for the following components:   Sodium 131 (*)    Chloride 94 (*)    Creatinine, Ser 1.40 (*)    Glucose, Bld 337 (*)    Calcium , Ion 1.14 (*)    All other components within normal limits  I-STAT CG4 LACTIC ACID, ED - Abnormal; Notable for the following components:   Lactic Acid, Venous 3.7 (*)    All other components within normal limits  I-STAT CG4 LACTIC ACID, ED - Abnormal; Notable for the following components:   Lactic Acid, Venous 2.2 (*)    All other components within normal limits  CBG MONITORING, ED - Abnormal; Notable for the following components:   Glucose-Capillary 272 (*)    All other components within normal limits  CBG MONITORING, ED - Abnormal; Notable for the following components:   Glucose-Capillary 172 (*)    All other components within normal limits  URINE CULTURE  BETA-HYDROXYBUTYRIC ACID  HCG, SERUM, QUALITATIVE  OSMOLALITY  BASIC METABOLIC PANEL WITH GFR  CBG MONITORING, ED  I-STAT CG4 LACTIC ACID, ED    EKG: None  Radiology: DG Chest Portable 1 View Result Date: 02/14/2024 EXAM: 1 VIEW(S) XRAY OF THE CHEST 02/14/2024 11:21:00 AM COMPARISON: 09/14/2023 CLINICAL HISTORY: DKA. Per chart: Here by POV from home for abnormal labs. Blood drawn yesterday.  Reports low Na 122, and high BS 889. Endorses tired, dizzy, light headed and sinus HA. Alert, NAD, calm, steady gait. FINDINGS: LUNGS AND PLEURA: No focal pulmonary opacity. No pulmonary edema. No pleural effusion. No pneumothorax. HEART AND MEDIASTINUM: No acute abnormality of the cardiac and mediastinal silhouettes. BONES AND SOFT TISSUES: No acute osseous abnormality. IMPRESSION: 1. No acute cardiopulmonary abnormality. Electronically signed by: Lynwood Seip MD 02/14/2024 11:34 AM EDT RP Workstation: [id number]   Procedures   Medications Ordered in the ED  lactated ringers  infusion (has no administration in time range)  dextrose 5 % in lactated ringers  infusion (0 mLs Intravenous Hold 02/14/24 1238)  dextrose 50 % solution 0-50 mL (has no administration in time range)  lactated ringers  bolus 1,000 mL (0 mLs Intravenous Stopped 02/14/24 1529)  lactated ringers  bolus 1,000 mL (0 mLs Intravenous Stopped 02/14/24 1529)    Clinical Course as of 02/14/24 1621  Fri Feb 14, 2024  1226 Last BS 272, will continue with fluids for now and hold off on insulin   [RR]  1349 Lactic acid downtrending to 2.2 from 3.7 [LS]    Clinical Course User Index [LS] Rogelia Jerilynn RAMAN, MD [RR] Bernis Ernst, [name]   Medical Decision Making Amount and/or Complexity of Data Reviewed Labs: ordered. Radiology: ordered.  Risk Prescription drug management.   42 y.o. female presents to the ER for evaluation of abnormal labs. Differential diagnosis includes but is not limited to electrolyte abnormality, DKA, HHS, hyperglycemia, lab error. Vital signs show some tachycardia, otherwise unremarkable. Physical exam as noted above.   Patient is non-compliant with insulin  regimen. On previous chart evaluation, labs yesterday showed sodium of 122 and glucose of 889. Denies any other symptoms other than fatigue. Will obtain labs and CXR. Will order fluids.   I independently reviewed and interpreted the patient's labs.  Lactic  acid improved from 3.7-2.2 with fluids  however patient only received 1 L before second lactic was collected.  Urinalysis shows cloudy urine with greater than 5 glucose present.  Small mild hemoglobin with greater than 3 and a protein and large leukocytes with low 11-20 red blood cells and greater than 50 white blood cells.  Many bacteria present with white blood cell clumps and budding yeast and hyaline cast.  VBG shows pH of 7.42.  CBC showed leukocytosis at 20.5 with a left shift.  It appears patient is chronically between 14 and 18.  Mild anemia with hemoglobin 11.4 but appears around baseline.  CMP shows sodium 129 however pseudohyponatremia given glucose of 364.  Chloride at 90.  Creatinine 1.38, slightly increased from patient's baseline around 1.1.  Total protein at 8.3.  Alk phos at 140.  Pregnancy pending.  CBG has improved and her glucose is now 98.  Beta-hydroxybutyrate gas is within normal limits.  Chest x-ray 1. No acute cardiopulmonary abnormality.   Her glucose continued to improve just with fluid initiation.  That we decided to discontinue the insulin .  Her lab take has not improved however is not resolved.  Will handoff to oncoming shift to follow-up with rest of workup.  Likely anticipate discharge home.  4:28 PM [facility] of [facility] transferred to PA Zelaya at the end of my shift as the patient will require reassessment once labs/imaging have resulted. Patient presentation, ED course, and plan of care discussed with review of all pertinent labs and imaging. Please see his/her note for further details regarding further ED course and disposition. Plan at time of handoff is follow up with . This may be altered or completely changed at the discretion of the oncoming team pending results of further workup.  Portions of this report may have been transcribed using voice recognition software. Every effort was made to ensure accuracy; however, inadvertent computerized transcription  errors may be present.    Final diagnoses:  Dehydration    ED Discharge Orders          Ordered    fluconazole  (DIFLUCAN ) 150 MG tablet  Daily        02/14/24 1522    cefadroxil (DURICEF) 500 MG capsule  2 times daily        02/14/24 1522               Bernis Ernst, [name] 02/14/24 1635    Rogelia Jerilynn RAMAN, MD 02/14/24 1650

## 2024-02-14 NOTE — ED Provider Notes (Signed)
 Accepted handoff at shift change from [place], [name]. Please see prior provider note for more detail.   Briefly: Patient is 43 y.o.   DDX: concern for DKA, hyperglycemia, medication noncompliance and dehydration  Plan: Reassess after fluid resuscitation and final lab results.  Physical Exam  BP 115/86 (BP Location: Right Arm)   Pulse 94   Temp 98.3 F (36.8 C) (Oral)   Resp 17   Wt 89.4 kg   LMP 02/07/2024 (Approximate)   SpO2 95%   BMI 36.03 kg/m   Physical Exam Vitals and nursing note reviewed.  Constitutional:      General: She is not in acute distress.    Appearance: She is not ill-appearing or toxic-appearing.  HENT:     Mouth/Throat:     Mouth: Mucous membranes are dry.  Eyes:     General: No scleral icterus. Cardiovascular:     Rate and Rhythm: Tachycardia present.  Pulmonary:     Effort: Pulmonary effort is normal. No respiratory distress.  Abdominal:     Palpations: Abdomen is soft.     Tenderness: There is no abdominal tenderness. There is no guarding or rebound.  Skin:    General: Skin is warm.     Capillary Refill: Capillary refill takes less than 2 seconds.  Neurological:     Mental Status: She is alert.     Procedures  Procedures  ED Course / MDM   Clinical Course as of 02/14/24 1722  Fri Feb 14, 2024  1226 Last BS 272, will continue with fluids for now and hold off on insulin   [RR]  1349 Lactic acid downtrending to 2.2 from 3.7 [LS]    Clinical Course User Index [LS] Rogelia Jerilynn RAMAN, MD [RR] Bernis Ernst, [name]   Medical Decision Making Amount and/or Complexity of Data Reviewed Labs: ordered. Radiology: ordered.  Risk Prescription drug management.    Patient sign out from [place], [name]. In brief, here with concerns of elevated BG and low sodium. Has been noncompliance with home medications for BG control.  No reported vomiting, diarrhea, but has had increased urine output.  Denies any obvious hematuria or noticeable dysuria. Pending  repeat labs and reassessment. BG improved with fluids alone indicating likely reason for hyperglycemia and elevated lactic acid level.  Lactic acid level improving and glucose level stabilized after fluids alone.  Will discharge home with instructions for close PCP follow-up.  Discharged home in stable condition.      [place] A, [name] 02/14/24 1728    Ruthe Cornet, DO 02/15/24 1552

## 2024-02-14 NOTE — Inpatient Diabetes Management (Signed)
 Inpatient Diabetes Program Recommendations  AACE/ADA: New Consensus Statement on Inpatient Glycemic Control (2015)  Target Ranges:  Prepandial:   less than 140 mg/dL      Peak postprandial:   less than 180 mg/dL (1-2 hours)      Critically ill patients:  140 - 180 mg/dL   Lab Results  Component Value Date   GLUCAP 172 (H) 02/14/2024   HGBA1C >15.5 (H) 12/20/2022    Review of Glycemic Control  Diabetes history: LADA - DM1 Outpatient Diabetes medications: Tresiba  54 units QAM, Humalog  45 units TID with meals Current orders for Inpatient glycemic control: IV insulin  per [facility] for hyperglycemia  Last HgbA1C - 10.8% Endo - Delon Helper, NP  Inpatient Diabetes Program Recommendations:    Spoke with pt at bedside regarding her diabetes control and HgbA1C of 10.8%. Pt had hard time verifying home meds. Seems sleepy and out of it. States she is going to start exercise program and start paying attention to her diabetes. Sees Endo every 3 months. Uses Dexcom G7 for monitoring.  Discussed importance of checking CBGs and maintaining good CBG control to prevent long-term and short-term complications. Explained how hyperglycemia leads to damage within blood vessels which lead to the common complications seen with uncontrolled diabetes. Stressed to the patient the importance of improving glycemic control to prevent further complications from uncontrolled diabetes. Discussed impact of nutrition, exercise, stress, sickness, and medications on diabetes control.  Discussed carbohydrates, carbohydrate goals per day and meal, along with portion sizes.  Pt had no questions and states she would likely be d/ced from ED later today.  Blood sugars already down to 172.  Thank you. Shona Brandy, RD, LDN, CDCES Inpatient Diabetes Coordinator [phone]

## 2024-02-14 NOTE — ED Triage Notes (Signed)
 Here by POV from home for abnormal labs. Blood drawn yesterday. Reports low Na 122, and high BS 889. Endorses tired, dizzy, light headed and sinus HA. Alert, NAD, calm, steady gait.

## 2024-02-14 NOTE — ED Notes (Signed)
 Lactic re-drawn, sent to [facility].

## 2024-02-14 NOTE — Discharge Instructions (Addendum)
 You were seen in the ER   Contact a doctor if: You have pain in your belly (abdomen) and the pain: Gets worse. Stays in one place. You have a rash. You have a stiff neck. You get angry or annoyed more easily than normal. You are more tired or have a harder time waking than normal. You feel weak or dizzy. You feel very thirsty. Get help right away if: You have any symptoms of very bad dehydration. You vomit every time you eat or drink. Your vomiting gets worse, does not go away, or you vomit blood or green stuff. You are getting treatment, but symptoms are getting worse. You have a fever. You have a very bad headache. You have: Diarrhea that gets worse or does not go away. Blood in your poop (stool). This may cause poop to look black and tarry. No pee in 6-8 hours. Only a small amount of pee in 6-8 hours, and the pee is very dark. You have trouble breathing. These symptoms may be an emergency. Get help right away. Call 911. Do not wait to see if the symptoms will go away. Do not drive yourself to the hospital.

## 2024-02-17 ENCOUNTER — Institutional Professional Consult (permissible substitution) (INDEPENDENT_AMBULATORY_CARE_PROVIDER_SITE_OTHER): Admitting: Otolaryngology

## 2024-02-17 LAB — URINE CULTURE: Culture: >=100000 — AB

## 2024-02-18 ENCOUNTER — Telehealth (HOSPITAL_BASED_OUTPATIENT_CLINIC_OR_DEPARTMENT_OTHER): Payer: Self-pay | Admitting: *Deleted

## 2024-02-18 NOTE — Telephone Encounter (Signed)
 Post ED Visit - Positive Culture Follow-up: Unsuccessful Patient Follow-up  Culture assessed and recommendations reviewed by:  [x]  Rodericks T Fobbs , Pharm.D. []  Venetia Gully, Pharm.D., BCPS AQ-ID []  Garrel Crews, Pharm.D., BCPS []  Almarie Lunger, Pharm.D., BCPS []  [place], [address].D., BCPS, AAHIVP []  Rosaline Bihari, Pharm.D., BCPS, AAHIVP []  Massie Rigg, PharmD []  Jodie Rower, PharmD, BCPS  Positive urine culture Dr Thom Fetters and wants to stop Cefadroxil and start Augmentin  875 q 12 hrs x 5 days.   []  Patient discharged without antimicrobial prescription and treatment is now indicated [x]  Organism is resistant to prescribed ED discharge antimicrobial []  Patient with positive blood cultures   Unable to contact patient after 3 attempts, letter will be sent to address on file  Jama Wyman Kipper 02/18/2024, 12:50 PM

## 2024-02-18 NOTE — Progress Notes (Signed)
 ED Antimicrobial Stewardship Positive Culture Follow Up   Lisa Werner is an 43 y.o. female who presented to [facility] on 02/14/2024 with a chief complaint of: Chief Complaint  Patient presents with   low NA, high BS    Recent Results (from the past 720 hours)  Urine Culture     Status: Abnormal   Collection Time: 02/14/24  3:17 PM   Specimen: Urine, Clean Catch  Result Value Ref Range Status   Specimen Description   Final    URINE, CLEAN CATCH Performed at [facility], [address] [place], [place] [address]    Special Requests   Final    NONE Performed at [facility], [address] [place], [place] [address]    Culture >=100,000 COLONIES/mL ESCHERICHIA COLI (A)  Final   Report Status 02/17/2024 FINAL  Final   Organism ID, Bacteria ESCHERICHIA COLI (A)  Final      Susceptibility   Escherichia coli - MIC*    AMPICILLIN >=32 RESISTANT Resistant     CEFAZOLIN (URINE) Value in next row Resistant      >=32 RESISTANTThis is a modified FDA-approved test that has been validated and its performance characteristics determined by the reporting laboratory.  This laboratory is certified under the Clinical Laboratory Improvement Amendments CLIA as qualified to perform high complexity clinical laboratory testing.    CEFEPIME Value in next row Sensitive      >=32 RESISTANTThis is a modified FDA-approved test that has been validated and its performance characteristics determined by the reporting laboratory.  This laboratory is certified under the Clinical Laboratory Improvement Amendments CLIA as qualified to perform high complexity clinical laboratory testing.    ERTAPENEM Value in next row Sensitive      >=32 RESISTANTThis is a modified FDA-approved test that has been validated and its performance characteristics determined by the reporting laboratory.  This laboratory is certified under the Clinical Laboratory Improvement Amendments CLIA as  qualified to perform high complexity clinical laboratory testing.    CEFTRIAXONE Value in next row Sensitive      >=32 RESISTANTThis is a modified FDA-approved test that has been validated and its performance characteristics determined by the reporting laboratory.  This laboratory is certified under the Clinical Laboratory Improvement Amendments CLIA as qualified to perform high complexity clinical laboratory testing.    CIPROFLOXACIN Value in next row Sensitive      >=32 RESISTANTThis is a modified FDA-approved test that has been validated and its performance characteristics determined by the reporting laboratory.  This laboratory is certified under the Clinical Laboratory Improvement Amendments CLIA as qualified to perform high complexity clinical laboratory testing.    GENTAMICIN Value in next row Sensitive      >=32 RESISTANTThis is a modified FDA-approved test that has been validated and its performance characteristics determined by the reporting laboratory.  This laboratory is certified under the Clinical Laboratory Improvement Amendments CLIA as qualified to perform high complexity clinical laboratory testing.    NITROFURANTOIN Value in next row Sensitive      >=32 RESISTANTThis is a modified FDA-approved test that has been validated and its performance characteristics determined by the reporting laboratory.  This laboratory is certified under the Clinical Laboratory Improvement Amendments CLIA as qualified to perform high complexity clinical laboratory testing.    TRIMETH/SULFA Value in next row Resistant      >=32 RESISTANTThis is a modified FDA-approved test that has been validated and its performance characteristics determined by the reporting  laboratory.  This laboratory is certified under the Clinical Laboratory Improvement Amendments CLIA as qualified to perform high complexity clinical laboratory testing.    AMPICILLIN/SULBACTAM Value in next row Sensitive      >=32 RESISTANTThis is a  modified FDA-approved test that has been validated and its performance characteristics determined by the reporting laboratory.  This laboratory is certified under the Clinical Laboratory Improvement Amendments CLIA as qualified to perform high complexity clinical laboratory testing.    PIP/TAZO Value in next row Sensitive      <=4 SENSITIVEThis is a modified FDA-approved test that has been validated and its performance characteristics determined by the reporting laboratory.  This laboratory is certified under the Clinical Laboratory Improvement Amendments CLIA as qualified to perform high complexity clinical laboratory testing.    MEROPENEM Value in next row Sensitive      <=4 SENSITIVEThis is a modified FDA-approved test that has been validated and its performance characteristics determined by the reporting laboratory.  This laboratory is certified under the Clinical Laboratory Improvement Amendments CLIA as qualified to perform high complexity clinical laboratory testing.    * >=100,000 COLONIES/mL ESCHERICHIA COLI   43 YO F presening with high BG 400 and low sodium of 129. Patient is a diabetic and reports not being compliant with insulin . Denies urinary symptoms, headache, vision changes, fevers/chills. Does endorse polyuria (most likely from her DKA). Urinalysis showed many bacteria, protein >= 300, budding yeast, RBC 11-20, Squamous cells 0-5, and WBC > 50. The decision to treat urine culture was made and the patient was sent out on Cefadroxil. Urine Culture growing E. Coli (R = AMP, SMX-TMP, CFZ). After speaking with Dr. Randol, will stop Cefadroxil and start Augmentin  875/125 q12 x 5 days.  [x]  Treated with Cefadroxil 500 mg BID, organism resistant to prescribed antimicrobial   New antibiotic prescription: Augmenting 875-125 q12H x 5 days  ED Provider: Thom Randol, MD   Lisa Werner 02/18/2024, 8:58 AM Clinical Pharmacist Monday - Friday phone -  [phone] Saturday - Sunday phone -  [phone]

## 2024-02-19 ENCOUNTER — Institutional Professional Consult (permissible substitution) (INDEPENDENT_AMBULATORY_CARE_PROVIDER_SITE_OTHER)

## 2024-03-06 MED ORDER — NA SULFATE-K SULFATE-MG SULF 17.5-3.13-1.6 GM/177ML PO SOLN: 1.0000 | Freq: Once | ORAL | 0 refills | Status: AC

## 2024-03-06 NOTE — Addendum Note (Signed)
 Addended by: LANETTE ALETHEA CROME on: 03/06/2024 12:19 PM   Modules accepted: Orders

## 2024-03-09 ENCOUNTER — Encounter: Payer: Self-pay | Admitting: Gastroenterology

## 2024-03-09 ENCOUNTER — Ambulatory Visit: Admitting: Gastroenterology

## 2024-03-09 VITALS — BP 116/80 | HR 98 | Temp 97.4°F | Resp 12 | Ht 62.5 in | Wt 199.0 lb

## 2024-03-09 DIAGNOSIS — R197 Diarrhea, unspecified: Secondary | ICD-10-CM

## 2024-03-09 DIAGNOSIS — R159 Full incontinence of feces: Secondary | ICD-10-CM | POA: Diagnosis not present

## 2024-03-09 DIAGNOSIS — K5909 Other constipation: Secondary | ICD-10-CM

## 2024-03-09 DIAGNOSIS — K6289 Other specified diseases of anus and rectum: Secondary | ICD-10-CM | POA: Diagnosis not present

## 2024-03-09 DIAGNOSIS — K5730 Diverticulosis of large intestine without perforation or abscess without bleeding: Secondary | ICD-10-CM | POA: Diagnosis not present

## 2024-03-09 DIAGNOSIS — K648 Other hemorrhoids: Secondary | ICD-10-CM

## 2024-03-09 MED ORDER — SODIUM CHLORIDE 0.9 % IV SOLN: 500.0000 mL | Freq: Once | INTRAVENOUS | Status: DC

## 2024-03-09 NOTE — Progress Notes (Signed)
 Sedate, gd SR, tolerated procedure well, VSS, report to RN

## 2024-03-09 NOTE — Op Note (Addendum)
 [facility] Patient Name: Lisa Werner Procedure Date: 03/09/2024 10:17 AM MRN: [id number] Endoscopist: Victory L. Legrand , MD, [id number] Age: 43 Referring MD:  Date of Birth: 1980-07-21 Gender: Female Account #: [id number] Procedure:                Colonoscopy Indications:              Constipation, Fecal incontinence, Rectal pain                           Also intermittent urgency with loose stool Medicines:                Monitored Anesthesia Care Procedure:                Pre-Anesthesia Assessment:                           - Prior to the procedure, a History and Physical                            was performed, and patient medications and                            allergies were reviewed. The patient's tolerance of                            previous anesthesia was also reviewed. The risks                            and benefits of the procedure and the sedation                            options and risks were discussed with the patient.                            All questions were answered, and informed consent                            was obtained. Prior Anticoagulants: The patient has                            taken no anticoagulant or antiplatelet agents. ASA                            Grade Assessment: III - A patient with severe                            systemic disease. After reviewing the risks and                            benefits, the patient was deemed in satisfactory                            condition to undergo the procedure.  After obtaining informed consent, the colonoscope                            was passed under direct vision. Throughout the                            procedure, the patient's blood pressure, pulse, and                            oxygen saturations were monitored continuously. The                            Colonoscope was introduced through the anus and                            advanced to  the the terminal ileum, with                            identification of the appendiceal orifice and IC                            valve. The colonoscopy was performed without                            difficulty. The patient tolerated the procedure                            well. The quality of the bowel preparation was                            excellent. The terminal ileum, ileocecal valve,                            appendiceal orifice, and rectum were photographed. Scope In: 10:43:10 AM Scope Out: 10:57:12 AM Scope Withdrawal Time: 0 hours 9 minutes 59 seconds  Total Procedure Duration: 0 hours 14 minutes 2 seconds  Findings:                 The digital rectal exam findings include decreased                            sphincter tone. Skin tags were also seen.                           The terminal ileum appeared normal.                           A few diverticula were found in the right colon.                           Normal mucosa was found in the entire colon.                            Biopsies for histology were taken with a cold  forceps from the right colon and left colon for                            evaluation of microscopic colitis.                           Internal hemorrhoids (single anterior column) were                            found. Inflamed with overlying erosion. This area                            had scant self-limited bleeding from DRE and again                            from scope pressure during rectal retroflexion.                           The exam was otherwise without abnormality on                            direct and retroflexion views. Complications:            No immediate complications. Estimated Blood Loss:     Estimated blood loss was minimal. Impression:               As suggested by our APP at the most recent office                            visit, this patient's overall clinical picture                             suggests altered motility and perhaps focal                            anorectal motility problems. If so, this seems very                            likely related to her poorly controlled diabetes                            with most recent hemoglobin A1c over 15.5 at                            endocrinology follow-up. (Consistently high values                            in recent years)                           This makes optimal control of the patient's                            diabetes of paramount importance to help regulate  her bowel habits, but of course more important to                            decrease the chance of further long-term diabetic                            complications in this patient who has already had a                            CVA.                           - Decreased sphincter tone found on digital rectal                            exam.                           - The examined portion of the ileum was normal.                           - Diverticulosis in the right colon.                           - Normal mucosa in the entire examined colon.                            Biopsied.                           - Internal hemorrhoids.                           - The examination was otherwise normal on direct                            and retroflexion views. Recommendation:           - Patient has a contact number available for                            emergencies. The signs and symptoms of potential                            delayed complications were discussed with the                            patient. Return to normal activities tomorrow.                            Written discharge instructions were provided to the                            patient.                           - Resume  previous diet.                           - Continue present medications.                           - Await pathology results.                            - Repeat colonoscopy in 10 years for screening                            purposes. Dola Lunsford L. Legrand, MD 03/09/2024 11:07:27 AM This report has been signed electronically.

## 2024-03-09 NOTE — Progress Notes (Signed)
 Pt's states no medical or surgical changes since previsit or office visit.

## 2024-03-09 NOTE — Progress Notes (Signed)
 History and Physical:  This patient presents for endoscopic testing for: Encounter Diagnoses  Name Primary?   Rectal pain Yes   Chronic constipation    Full incontinence of feces     43 year old woman here today for colonoscopy to evaluate lower digestive symptoms as described in APP office note of 01/20/2024. No significant clinical changes since that visit. She had an ED visit on 02/14/2024 for dehydration, and subsequent follow-up with endocrinology noting suboptimal control of diabetes. (Hemoglobin A1c over 15.5 on 02/13/2024 with a glucose of 889 that day) hemoglobin A1c has run high on multiple occasions before that dating back to at least April 2023. Patient is otherwise without complaints or active issues today.   Past Medical History: Past Medical History:  Diagnosis Date   Diabetes mellitus without complication (HCC)    Hyperlipidemia    Hypertension      Past Surgical History: History reviewed. No pertinent surgical history.  Allergies: Allergies  Allergen Reactions   Pollen Extract Other (See Comments)    seasonal    Outpatient Meds: Current Outpatient Medications  Medication Sig Dispense Refill   AgaMatrix Ultra-Thin Lancets MISC Check blood glucose before meals 3 times daily. 100 each 11   Blood Glucose Monitoring Suppl (AGAMATRIX PRESTO) w/Device KIT Check blood glucose 3 times daily before meals 1 kit 0   glucose blood (AGAMATRIX PRESTO TEST) test strip Check blood glucose 3 times daily before meals 100 each 12   Insulin  Pen Needle (BD PEN NEEDLE NANO U/F) 32G X 4 MM MISC Use 4 (four) times daily. 100 each 0   acetaminophen  (TYLENOL ) 325 MG tablet Take 1-2 tablets (325-650 mg total) by mouth every 4 (four) hours as needed for mild pain.     aspirin  EC 81 MG tablet Take 1 tablet (81 mg total) by mouth daily. Swallow whole. 120 tablet 0   hydrochlorothiazide (HYDRODIURIL) 12.5 MG tablet Take 12.5 mg by mouth every morning.     insulin  aspart (NOVOLOG  FLEXPEN)  100 UNIT/ML FlexPen Inject 18 Units into the skin 3 (three) times daily with meals. 15 mL 0   linaclotide  (LINZESS ) 145 MCG CAPS capsule Take 1 capsule (145 mcg total) by mouth daily before breakfast. 30 capsule 3   linaclotide  (LINZESS ) 72 MCG capsule Take 1 capsule (72 mcg total) by mouth daily before breakfast.     melatonin 3 MG TABS tablet Take 1 tablet (3 mg total) by mouth at bedtime as needed (insomnia). 60 tablet 0   METAMUCIL FIBER PO Take 1 Dose by mouth daily.     pantoprazole  (PROTONIX ) 40 MG tablet Take 1 tablet (40 mg total) by mouth daily. 30 tablet 3   rosuvastatin  (CRESTOR ) 40 MG tablet Take 1 tablet (40 mg total) by mouth at bedtime. 30 tablet 0   TRESIBA  FLEXTOUCH 100 UNIT/ML FlexTouch Pen Inject 33 Units into the skin daily AND 23 Units at bedtime. (Patient taking differently: 48 units in the mornings) 15 mL 1   valsartan  (DIOVAN ) 40 MG tablet Take 1 tablet (40 mg total) by mouth daily. 30 tablet 3   Current Facility-Administered Medications  Medication Dose Route Frequency Provider Last Rate Last Admin   0.9 %  sodium chloride  infusion  500 mL Intravenous Once Danis, Analei Whinery L III, MD          ___________________________________________________________________ Objective   Exam:  BP 132/77   Pulse 84   Temp (!) 97.4 F (36.3 C)   Resp 15   Ht 5' 2.5 (1.588 m)  Wt 199 lb (90.3 kg)   LMP 02/07/2024 (Approximate)   SpO2 100%   BMI 35.82 kg/m   CV: regular , S1/S2 Resp: clear to auscultation bilaterally, normal RR and effort noted GI: soft, no tenderness, with active bowel sounds.   Assessment: Encounter Diagnoses  Name Primary?   Rectal pain Yes   Chronic constipation    Full incontinence of feces   Poorly controlled diabetes   Plan: Colonoscopy   The benefits and risks of the planned procedure(s) were described in detail with the patient or (when appropriate) their health care proxy.  Risks were outlined as including, but not limited to,  bleeding, infection, perforation, adverse medication reaction leading to cardiac or pulmonary decompensation, pancreatitis (if ERCP).  The limitation of incomplete mucosal visualization was also discussed.  No guarantees or warranties were given.  The patient was provided an opportunity to ask questions and all were answered. The patient agreed with the plan.   The patient is appropriate for an endoscopic procedure in the ambulatory setting.   - Victory Brand, MD

## 2024-03-09 NOTE — Patient Instructions (Signed)
-   Resume previous diet. - Continue present medications. - Await pathology results. - Repeat colonoscopy in 10 years for screening purposes.  YOU HAD AN ENDOSCOPIC PROCEDURE TODAY AT [facility]:   Refer to the procedure report that was given to you for any specific questions about what was found during the examination.  If the procedure report does not answer your questions, please call your gastroenterologist to clarify.  If you requested that your care partner not be given the details of your procedure findings, then the procedure report has been included in a sealed envelope for you to review at your convenience later.  YOU SHOULD EXPECT: Some feelings of bloating in the abdomen. Passage of more gas than usual.  Walking can help get rid of the air that was put into your GI tract during the procedure and reduce the bloating. If you had a lower endoscopy (such as a colonoscopy or flexible sigmoidoscopy) you may notice spotting of blood in your stool or on the toilet paper. If you underwent a bowel prep for your procedure, you may not have a normal bowel movement for a few days.  Please Note:  You might notice some irritation and congestion in your nose or some drainage.  This is from the oxygen used during your procedure.  There is no need for concern and it should clear up in a day or so.  SYMPTOMS TO REPORT IMMEDIATELY:  Following lower endoscopy (colonoscopy or flexible sigmoidoscopy):  Excessive amounts of blood in the stool  Significant tenderness or worsening of abdominal pains  Swelling of the abdomen that is new, acute  Fever of 100F or higher  For urgent or emergent issues, a gastroenterologist can be reached at any hour by calling [phone]. Do not use MyChart messaging for urgent concerns.    DIET:  We do recommend a small meal at first, but then you may proceed to your regular diet.  Drink plenty of fluids but you should avoid alcoholic beverages for 24  hours.  ACTIVITY:  You should plan to take it easy for the rest of today and you should NOT DRIVE or use heavy machinery until tomorrow (because of the sedation medicines used during the test).    FOLLOW UP: Our staff will call the number listed on your records the next business day following your procedure.  We will call around 7:15- 8:00 am to check on you and address any questions or concerns that you may have regarding the information given to you following your procedure. If we do not reach you, we will leave a message.     If any biopsies were taken you will be contacted by phone or by letter within the next 1-3 weeks.  Please call us at [phone] if you have not heard about the biopsies in 3 weeks.    SIGNATURES/CONFIDENTIALITY: You and/or your care partner have signed paperwork which will be entered into your electronic medical record.  These signatures attest to the fact that that the information above on your After Visit Summary has been reviewed and is understood.  Full responsibility of the confidentiality of this discharge information lies with you and/or your care-partner.

## 2024-03-09 NOTE — Progress Notes (Signed)
 Called to room to assist during endoscopic procedure.  Patient ID and intended procedure confirmed with present staff. Received instructions for my participation in the procedure from the performing physician.

## 2024-03-10 ENCOUNTER — Telehealth: Payer: Self-pay | Admitting: *Deleted

## 2024-03-10 NOTE — Telephone Encounter (Signed)
No answer for post procedure call back and unable to leave message. 

## 2024-03-11 LAB — SURGICAL PATHOLOGY

## 2024-03-14 ENCOUNTER — Ambulatory Visit: Payer: Self-pay | Admitting: Gastroenterology

## 2024-11-05 ENCOUNTER — Ambulatory Visit: Admitting: Adult Health
